# Patient Record
Sex: Female | Born: 1995 | Race: White | Hispanic: No | Marital: Married | Smoking: Never smoker
Health system: Southern US, Community
[De-identification: ages and names within clinical notes are randomized; demographics above are authoritative.]

## PROBLEM LIST (undated history)

## (undated) ENCOUNTER — Inpatient Hospital Stay (HOSPITAL_COMMUNITY): Payer: Self-pay

## (undated) DIAGNOSIS — F419 Anxiety disorder, unspecified: Secondary | ICD-10-CM

## (undated) DIAGNOSIS — O139 Gestational [pregnancy-induced] hypertension without significant proteinuria, unspecified trimester: Secondary | ICD-10-CM

## (undated) DIAGNOSIS — G43909 Migraine, unspecified, not intractable, without status migrainosus: Secondary | ICD-10-CM

## (undated) DIAGNOSIS — O321XX0 Maternal care for breech presentation, not applicable or unspecified: Secondary | ICD-10-CM

## (undated) DIAGNOSIS — F329 Major depressive disorder, single episode, unspecified: Secondary | ICD-10-CM

## (undated) DIAGNOSIS — N809 Endometriosis, unspecified: Secondary | ICD-10-CM

## (undated) DIAGNOSIS — F32A Depression, unspecified: Secondary | ICD-10-CM

## (undated) DIAGNOSIS — O24419 Gestational diabetes mellitus in pregnancy, unspecified control: Secondary | ICD-10-CM

## (undated) DIAGNOSIS — D219 Benign neoplasm of connective and other soft tissue, unspecified: Secondary | ICD-10-CM

## (undated) DIAGNOSIS — Z98891 History of uterine scar from previous surgery: Secondary | ICD-10-CM

## (undated) HISTORY — PX: BREAST REDUCTION SURGERY: SHX8

## (undated) HISTORY — PX: TUBAL LIGATION: SHX77

## (undated) HISTORY — PX: BREAST SURGERY: SHX581

## (undated) HISTORY — PX: NISSEN FUNDOPLICATION: SHX2091

---
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
## 2014-12-10 DIAGNOSIS — K219 Gastro-esophageal reflux disease without esophagitis: Secondary | ICD-10-CM | POA: Insufficient documentation

## 2014-12-10 HISTORY — DX: Gastro-esophageal reflux disease without esophagitis: K21.9

## 2014-12-10 HISTORY — PX: LAPAROSCOPY: SHX197

## 2015-05-15 DIAGNOSIS — G9340 Encephalopathy, unspecified: Secondary | ICD-10-CM | POA: Insufficient documentation

## 2015-09-20 HISTORY — PX: TONSILLECTOMY: SUR1361

## 2016-04-29 LAB — OB RESULTS CONSOLE RPR: RPR: NONREACTIVE

## 2016-04-29 LAB — OB RESULTS CONSOLE GC/CHLAMYDIA
Chlamydia: NEGATIVE
GC PROBE AMP, GENITAL: NEGATIVE

## 2016-04-29 LAB — OB RESULTS CONSOLE ANTIBODY SCREEN: ANTIBODY SCREEN: POSITIVE

## 2016-04-29 LAB — OB RESULTS CONSOLE HEPATITIS B SURFACE ANTIGEN: Hepatitis B Surface Ag: NEGATIVE

## 2016-04-29 LAB — OB RESULTS CONSOLE ABO/RH: RH Type: NEGATIVE

## 2016-04-29 LAB — OB RESULTS CONSOLE HIV ANTIBODY (ROUTINE TESTING): HIV: NONREACTIVE

## 2016-06-02 ENCOUNTER — Encounter (HOSPITAL_COMMUNITY): Payer: Self-pay | Admitting: *Deleted

## 2016-06-02 ENCOUNTER — Inpatient Hospital Stay (HOSPITAL_COMMUNITY)
Admission: AD | Admit: 2016-06-02 | Discharge: 2016-06-02 | Disposition: A | Discharge status: Home / Self Care | Payer: BLUE CROSS/BLUE SHIELD | Source: Ambulatory Visit | Attending: Obstetrics and Gynecology | Admitting: Obstetrics and Gynecology

## 2016-06-02 DIAGNOSIS — Z3A16 16 weeks gestation of pregnancy: Secondary | ICD-10-CM | POA: Insufficient documentation

## 2016-06-02 DIAGNOSIS — O9989 Other specified diseases and conditions complicating pregnancy, childbirth and the puerperium: Secondary | ICD-10-CM | POA: Diagnosis not present

## 2016-06-02 DIAGNOSIS — O26892 Other specified pregnancy related conditions, second trimester: Secondary | ICD-10-CM | POA: Diagnosis not present

## 2016-06-02 DIAGNOSIS — E162 Hypoglycemia, unspecified: Secondary | ICD-10-CM | POA: Diagnosis not present

## 2016-06-02 DIAGNOSIS — E161 Other hypoglycemia: Secondary | ICD-10-CM | POA: Diagnosis not present

## 2016-06-02 DIAGNOSIS — H538 Other visual disturbances: Secondary | ICD-10-CM | POA: Diagnosis present

## 2016-06-02 DIAGNOSIS — Z3492 Encounter for supervision of normal pregnancy, unspecified, second trimester: Secondary | ICD-10-CM

## 2016-06-02 HISTORY — DX: Migraine, unspecified, not intractable, without status migrainosus: G43.909

## 2016-06-02 LAB — CBC
HCT: 33.9 % — ABNORMAL LOW (ref 36.0–46.0)
Hemoglobin: 12 g/dL (ref 12.0–15.0)
MCH: 31.3 pg (ref 26.0–34.0)
MCHC: 35.4 g/dL (ref 30.0–36.0)
MCV: 88.5 fL (ref 78.0–100.0)
Platelets: 236 10*3/uL (ref 150–400)
RBC: 3.83 MIL/uL — AB (ref 3.87–5.11)
RDW: 12.4 % (ref 11.5–15.5)
WBC: 11.7 10*3/uL — ABNORMAL HIGH (ref 4.0–10.5)

## 2016-06-02 LAB — URINALYSIS, ROUTINE W REFLEX MICROSCOPIC
BILIRUBIN URINE: NEGATIVE
GLUCOSE, UA: NEGATIVE mg/dL
HGB URINE DIPSTICK: NEGATIVE
KETONES UR: NEGATIVE mg/dL
Nitrite: NEGATIVE
PH: 6 (ref 5.0–8.0)
Protein, ur: NEGATIVE mg/dL
SPECIFIC GRAVITY, URINE: 1.025 (ref 1.005–1.030)

## 2016-06-02 LAB — COMPREHENSIVE METABOLIC PANEL
ALK PHOS: 42 U/L (ref 38–126)
ALT: 12 U/L — AB (ref 14–54)
AST: 17 U/L (ref 15–41)
Albumin: 3.5 g/dL (ref 3.5–5.0)
Anion gap: 6 (ref 5–15)
BUN: 7 mg/dL (ref 6–20)
CALCIUM: 8.8 mg/dL — AB (ref 8.9–10.3)
CO2: 23 mmol/L (ref 22–32)
CREATININE: 0.47 mg/dL (ref 0.44–1.00)
Chloride: 106 mmol/L (ref 101–111)
GFR calc non Af Amer: >60 mL/min (ref >60)
Glucose, Bld: 77 mg/dL (ref 65–99)
Potassium: 3.8 mmol/L (ref 3.5–5.1)
SODIUM: 135 mmol/L (ref 135–145)
Total Bilirubin: 0.4 mg/dL (ref 0.3–1.2)
Total Protein: 6.7 g/dL (ref 6.5–8.1)

## 2016-06-02 LAB — URINE MICROSCOPIC-ADD ON: RBC / HPF: NONE SEEN RBC/hpf (ref 0–5)

## 2016-06-02 NOTE — MAU Provider Note (Signed)
History     CSN: [id number]  Arrival date and time: 06/02/16 1020   None    G3P0020 @16 .1 weeks c/o acute onset of blurry vision and lightheadedness. She reports these episodes starting about 2 weeks ago but when occurred today it was more severe and lasted longer. Nausea was also associated with the episode today. She calls the episodes "head zaps" and they usually last a few seconds. She denies LOC. No recent fevers. She denies SOB or chest pain. She reports good hydration and nutrition this am however she ate pancakes with syrup and water. She denies anxiety, worry, and increased stress. She has a history of migraines but denies similar sx.   OB History    Gravida Para Term Preterm AB Living   3       2     SAB TAB Ectopic Multiple Live Births   2              Past Medical History:  Diagnosis Date  . Migraines     Past Surgical History:  Procedure Laterality Date  . ADENOIDECTOMY  10/29/2007  . BREAST REDUCTION SURGERY    . LAPAROSCOPY  12/10/2014  . RHINOPLASTY  03/22/2010   deviated septum  . TONSILLECTOMY  2017    History reviewed. No pertinent family history.  Social History  Substance Use Topics  . Smoking status: Never Smoker  . Smokeless tobacco: Never Used  . Alcohol use Not on file    Allergies: No Known Allergies  Prescriptions Prior to Admission  Medication Sig Dispense Refill Last Dose  . acetaminophen (TYLENOL) 325 MG tablet Take 650 mg by mouth every 6 (six) hours as needed for moderate pain.   Past Week at Unknown time  . Doxylamine-Pyridoxine (DICLEGIS) 10-10 MG TBEC Take 1 tablet by mouth as needed.    Past Week at Unknown time  . Prenatal Vit-Fe Fumarate-FA (MULTIVITAMIN-PRENATAL) 27-0.8 MG TABS tablet Take 1 tablet by mouth daily at 12 noon.   06/01/2016 at Unknown time  . sertraline (ZOLOFT) 25 MG tablet Take 25 mg by mouth daily.   06/01/2016 at Unknown time    Review of Systems  Constitutional: Negative.   Eyes: Positive for blurred  vision. Negative for double vision and photophobia.  Cardiovascular: Negative.   Gastrointestinal: Positive for nausea.  Neurological: Positive for dizziness. Negative for headaches.   Physical Exam   Blood pressure 102/74, pulse 84, temperature 97.8 F (36.6 C), temperature source Oral, resp. rate 16, last menstrual period 02/10/2016, SpO2 100 %.  Physical Exam  Constitutional: She is oriented to person, place, and time. She appears well-developed and well-nourished.  HENT:  Head: Normocephalic and atraumatic.  Neck: Normal range of motion.  Cardiovascular: Normal rate and regular rhythm.   Respiratory: Effort normal and breath sounds normal.  Musculoskeletal: Normal range of motion.  Neurological: She is alert and oriented to person, place, and time. No cranial nerve deficit.  Skin: Skin is warm and dry.  Psychiatric: She has a normal mood and affect.  FHT: 152 bpm  Results for orders placed or performed during the hospital encounter of 06/02/16 (from the past 24 hour(s))  Urinalysis, Routine w reflex microscopic (not at [facility])     Status: Abnormal   Collection Time: 06/02/16 10:30 AM  Result Value Ref Range   Color, Urine YELLOW YELLOW   APPearance HAZY (A) CLEAR   Specific Gravity, Urine 1.025 1.005 - 1.030   pH 6.0 5.0 - 8.0   Glucose, UA  NEGATIVE NEGATIVE mg/dL   Hgb urine dipstick NEGATIVE NEGATIVE   Bilirubin Urine NEGATIVE NEGATIVE   Ketones, ur NEGATIVE NEGATIVE mg/dL   Protein, ur NEGATIVE NEGATIVE mg/dL   Nitrite NEGATIVE NEGATIVE   Leukocytes, UA TRACE (A) NEGATIVE  Urine microscopic-add on     Status: Abnormal   Collection Time: 06/02/16 10:30 AM  Result Value Ref Range   Squamous Epithelial / LPF 0-5 (A) NONE SEEN   WBC, UA 0-5 0 - 5 WBC/hpf   RBC / HPF NONE SEEN 0 - 5 RBC/hpf   Bacteria, UA RARE (A) NONE SEEN   Urine-Other MUCOUS PRESENT   CBC     Status: Abnormal   Collection Time: 06/02/16 11:39 AM  Result Value Ref Range   WBC 11.7 (H) 4.0 - 10.5  K/uL   RBC 3.83 (L) 3.87 - 5.11 MIL/uL   Hemoglobin 12.0 12.0 - 15.0 g/dL   HCT 33.9 (L) 36.0 - 46.0 %   MCV 88.5 78.0 - 100.0 fL   MCH 31.3 26.0 - 34.0 pg   MCHC 35.4 30.0 - 36.0 g/dL   RDW 12.4 11.5 - 15.5 %   Platelets 236 150 - 400 K/uL  Comprehensive metabolic panel     Status: Abnormal   Collection Time: 06/02/16 11:39 AM  Result Value Ref Range   Sodium 135 135 - 145 mmol/L   Potassium 3.8 3.5 - 5.1 mmol/L   Chloride 106 101 - 111 mmol/L   CO2 23 22 - 32 mmol/L   Glucose, Bld 77 65 - 99 mg/dL   BUN 7 6 - 20 mg/dL   Creatinine, Ser 0.47 0.44 - 1.00 mg/dL   Calcium 8.8 (L) 8.9 - 10.3 mg/dL   Total Protein 6.7 6.5 - 8.1 g/dL   Albumin 3.5 3.5 - 5.0 g/dL   AST 17 15 - 41 U/L   ALT 12 (L) 14 - 54 U/L   Alkaline Phosphatase 42 38 - 126 U/L   Total Bilirubin 0.4 0.3 - 1.2 mg/dL   GFR calc non Af Amer >60 >60 mL/min   GFR calc Af Amer >60 >60 mL/min   Anion gap 6 5 - 15    MAU Course  Procedures  MDM Labs ordered and reviewed. No evidence of acute illness. Etiology likely transient hypoglycemia r/t nutrition, exacerbated by pregnancy. Discussed presentation and exam findings with Dr. Marvel Plan. Stable for discharge home.  Assessment and Plan   1. Second trimester pregnancy   2.      Transient hypoglycemia  Discharge home Consume frequent meals and snacks, always pair protein with carb Maintain good hydration with water Follow up as scheduled in OB office Call for sooner appt for persistent or worsening sx  Julianne Handler, CNM 06/02/2016, 11:02 AM

## 2016-06-02 NOTE — MAU Note (Signed)
Pt was in class and said around 0930 her vision got really blurry and she couldn't move and she felt really nauseated.  She still says her vision is still blurry and she is still nauseous.  Denies LOF/VB.

## 2016-06-02 NOTE — Discharge Instructions (Signed)
Second Trimester of Pregnancy The second trimester is from week 13 through week 28, months 4 through 6. The second trimester is often a time when you feel your best. Your body has also adjusted to being pregnant, and you begin to feel better physically. Usually, morning sickness has lessened or quit completely, you may have more energy, and you may have an increase in appetite. The second trimester is also a time when the fetus is growing rapidly. At the end of the sixth month, the fetus is about 9 inches long and weighs about 1 pounds. You will likely begin to feel the baby move (quickening) between 18 and 20 weeks of the pregnancy. BODY CHANGES Your body goes through many changes during pregnancy. The changes vary from woman to woman.   Your weight will continue to increase. You will notice your lower abdomen bulging out.  You may begin to get stretch marks on your hips, abdomen, and breasts.  You may develop headaches that can be relieved by medicines approved by your health care provider.  You may urinate more often because the fetus is pressing on your bladder.  You may develop or continue to have heartburn as a result of your pregnancy.  You may develop constipation because certain hormones are causing the muscles that push waste through your intestines to slow down.  You may develop hemorrhoids or swollen, bulging veins (varicose veins).  You may have back pain because of the weight gain and pregnancy hormones relaxing your joints between the bones in your pelvis and as a result of a shift in weight and the muscles that support your balance.  Your breasts will continue to grow and be tender.  Your gums may bleed and may be sensitive to brushing and flossing.  Dark spots or blotches (chloasma, mask of pregnancy) may develop on your face. This will likely fade after the baby is born.  A dark line from your belly button to the pubic area (linea nigra) may appear. This will likely fade  after the baby is born.  You may have changes in your hair. These can include thickening of your hair, rapid growth, and changes in texture. Some women also have hair loss during or after pregnancy, or hair that feels dry or thin. Your hair will most likely return to normal after your baby is born. WHAT TO EXPECT AT YOUR PRENATAL VISITS During a routine prenatal visit:  You will be weighed to make sure you and the fetus are growing normally.  Your blood pressure will be taken.  Your abdomen will be measured to track your baby's growth.  The fetal heartbeat will be listened to.  Any test results from the previous visit will be discussed. Your health care provider may ask you:  How you are feeling.  If you are feeling the baby move.  If you have had any abnormal symptoms, such as leaking fluid, bleeding, severe headaches, or abdominal cramping.  If you are using any tobacco products, including cigarettes, chewing tobacco, and electronic cigarettes.  If you have any questions. Other tests that may be performed during your second trimester include:  Blood tests that check for:  Low iron levels (anemia).  Gestational diabetes (between 24 and 28 weeks).  Rh antibodies.  Urine tests to check for infections, diabetes, or protein in the urine.  An ultrasound to confirm the proper growth and development of the baby.  An amniocentesis to check for possible genetic problems.  Fetal screens for spina bifida  and Down syndrome.  HIV (human immunodeficiency virus) testing. Routine prenatal testing includes screening for HIV, unless you choose not to have this test. HOME CARE INSTRUCTIONS   Avoid all smoking, herbs, alcohol, and unprescribed drugs. These chemicals affect the formation and growth of the baby.  Do not use any tobacco products, including cigarettes, chewing tobacco, and electronic cigarettes. If you need help quitting, ask your health care provider. You may receive  counseling support and other resources to help you quit.  Follow your health care provider's instructions regarding medicine use. There are medicines that are either safe or unsafe to take during pregnancy.  Exercise only as directed by your health care provider. Experiencing uterine cramps is a good sign to stop exercising.  Continue to eat regular, healthy meals.  Wear a good support bra for breast tenderness.  Do not use hot tubs, steam rooms, or saunas.  Wear your seat belt at all times when driving.  Avoid raw meat, uncooked cheese, cat litter boxes, and soil used by cats. These carry germs that can cause birth defects in the baby.  Take your prenatal vitamins.  Take 1500-2000 mg of calcium daily starting at the 20th week of pregnancy until you deliver your baby.  Try taking a stool softener (if your health care provider approves) if you develop constipation. Eat more high-fiber foods, such as fresh vegetables or fruit and whole grains. Drink plenty of fluids to keep your urine clear or pale yellow.  Take warm sitz baths to soothe any pain or discomfort caused by hemorrhoids. Use hemorrhoid cream if your health care provider approves.  If you develop varicose veins, wear support hose. Elevate your feet for 15 minutes, 3-4 times a day. Limit salt in your diet.  Avoid heavy lifting, wear low heel shoes, and practice good posture.  Rest with your legs elevated if you have leg cramps or low back pain.  Visit your dentist if you have not gone yet during your pregnancy. Use a soft toothbrush to brush your teeth and be gentle when you floss.  A sexual relationship may be continued unless your health care provider directs you otherwise.  Continue to go to all your prenatal visits as directed by your health care provider. SEEK MEDICAL CARE IF:   You have dizziness.  You have mild pelvic cramps, pelvic pressure, or nagging pain in the abdominal area.  You have persistent nausea,  vomiting, or diarrhea.  You have a bad smelling vaginal discharge.  You have pain with urination. SEEK IMMEDIATE MEDICAL CARE IF:   You have a fever.  You are leaking fluid from your vagina.  You have spotting or bleeding from your vagina.  You have severe abdominal cramping or pain.  You have rapid weight gain or loss.  You have shortness of breath with chest pain.  You notice sudden or extreme swelling of your face, hands, ankles, feet, or legs.  You have not felt your baby move in over an hour.  You have severe headaches that do not go away with medicine.  You have vision changes.   This information is not intended to replace advice given to you by your health care provider. Make sure you discuss any questions you have with your health care provider.   Document Released: 08/30/2001 Document Revised: 09/26/2014 Document Reviewed: 11/06/2012 Elsevier Interactive Patient Education 2016 [place] for Pregnant Women While you are pregnant, your body will require additional nutrition to help support your growing baby. It  is recommended that you consume:  150 additional calories each day during your first trimester.  300 additional calories each day during your second trimester.  300 additional calories each day during your third trimester. Eating a healthy, well-balanced diet is very important for your health and for your baby's health. You also have a higher need for some vitamins and minerals, such as folic acid, calcium, iron, and vitamin D. WHAT DO I NEED TO KNOW ABOUT EATING DURING PREGNANCY?  Do not try to lose weight or go on a diet during pregnancy.  Choose healthy, nutritious foods. Choose  of a sandwich with a glass of milk instead of a candy bar or a high-calorie sugar-sweetened beverage.  Limit your overall intake of foods that have "empty calories." These are foods that have little nutritional value, such as sweets, desserts, candies,  sugar-sweetened beverages, and fried foods.  Eat a variety of foods, especially fruits and vegetables.  Take a prenatal vitamin to help meet the additional needs during pregnancy, specifically for folic acid, iron, calcium, and vitamin D.  Remember to stay active. Ask your health care provider for exercise recommendations that are specific to you.  Practice good food safety and cleanliness, such as washing your hands before you eat and after you prepare raw meat. This helps to prevent foodborne illnesses, such as listeriosis, that can be very dangerous for your baby. Ask your health care provider for more information about listeriosis. WHAT DOES 150 EXTRA CALORIES LOOK LIKE? Healthy options for an additional 150 calories each day could be any of the following:  Plain low-fat yogurt (6-8 oz) with  cup of berries.  1 apple with 2 teaspoons of peanut butter.  Cut-up vegetables with  cup of hummus.  Low-fat chocolate milk (8 oz or 1 cup).  1 string cheese with 1 medium orange.   of a peanut butter and jelly sandwich on whole-wheat bread (1 tsp of peanut butter). For 300 calories, you could eat two of those healthy options each day.  WHAT IS A HEALTHY AMOUNT OF WEIGHT TO GAIN? The recommended amount of weight for you to gain is based on your pre-pregnancy BMI. If your pre-pregnancy BMI was:  Less than 18 (underweight), you should gain 28-40 lb.  18-24.9 (normal), you should gain 25-35 lb.  25-29.9 (overweight), you should gain 15-25 lb.  Greater than 30 (obese), you should gain 11-20 lb. WHAT IF I AM HAVING TWINS OR MULTIPLES? Generally, pregnant women who will be having twins or multiples may need to increase their daily calories by 300-600 calories each day. The recommended range for total weight gain is 25-54 lb, depending on your pre-pregnancy BMI. Talk with your health care provider for specific guidance about additional nutritional needs, weight gain, and exercise during your  pregnancy. WHAT FOODS CAN I EAT? Grains Any grains. Try to choose whole grains, such as whole-wheat bread, oatmeal, or brown rice. Vegetables Any vegetables. Try to eat a variety of colors and types of vegetables to get a full range of vitamins and minerals. Remember to wash your vegetables well before eating. Fruits Any fruits. Try to eat a variety of colors and types of fruit to get a full range of vitamins and minerals. Remember to wash your fruits well before eating. Meats and Other Protein Sources Lean meats, including chicken, [place], fish, and lean cuts of beef, veal, or pork. Make sure that all meats are cooked to "well done." Tofu. Tempeh. Beans. Eggs. Peanut butter and other nut butters. Seafood,  such as shrimp, crab, and lobster. If you choose fish, select types that are higher in omega-3 fatty acids, including salmon, herring, mussels, trout, sardines, and pollock. Make sure that all meats are cooked to food-safe temperatures. Dairy Pasteurized milk and milk alternatives. Pasteurized yogurt and pasteurized cheese. Cottage cheese. Sour cream. Beverages Water. Juices that contain 100% fruit juice or vegetable juice. Caffeine-free teas and decaffeinated coffee. Drinks that contain caffeine are okay to drink, but it is better to avoid caffeine. Keep your total caffeine intake to less than 200 mg each day (12 oz of coffee, tea, or soda) or as directed by your health care provider. Condiments Any pasteurized condiments. Sweets and Desserts Any sweets and desserts. Fats and Oils Any fats and oils. The items listed above may not be a complete list of recommended foods or beverages. Contact your dietitian for more options. WHAT FOODS ARE NOT RECOMMENDED? Vegetables Unpasteurized (raw) vegetable juices. Fruits Unpasteurized (raw) fruit juices. Meats and Other Protein Sources Cured meats that have nitrates, such as bacon, salami, and hotdogs. Luncheon meats, bologna, or other deli meats  (unless they are reheated until they are steaming hot). Refrigerated pate, meat spreads from a meat counter, smoked seafood that is found in the refrigerated section of a store. Raw fish, such as sushi or sashimi. High mercury content fish, such as tilefish, shark, swordfish, and king mackerel. Raw meats, such as tuna or beef tartare. Undercooked meats and poultry. Make sure that all meats are cooked to food-safe temperatures. Dairy Unpasteurized (raw) milk and any foods that have raw milk in them. Soft cheeses, such as feta, queso blanco, queso fresco, Brie, Camembert cheeses, blue-veined cheeses, and Panela cheese (unless it is made with pasteurized milk, which must be stated on the label). Beverages Alcohol. Sugar-sweetened beverages, such as sodas, teas, or energy drinks. Condiments Homemade fermented foods and drinks, such as pickles, sauerkraut, or kombucha drinks. (Store-bought pasteurized versions of these are okay.) Other Salads that are made in the store, such as ham salad, chicken salad, egg salad, tuna salad, and seafood salad. The items listed above may not be a complete list of foods and beverages to avoid. Contact your dietitian for more information.   This information is not intended to replace advice given to you by your health care provider. Make sure you discuss any questions you have with your health care provider.   Document Released: 06/20/2014 Document Reviewed: 06/20/2014 Elsevier Interactive Patient Education [employer].

## 2016-06-28 LAB — OB RESULTS CONSOLE GBS: STREP GROUP B AG: POSITIVE

## 2016-07-30 ENCOUNTER — Inpatient Hospital Stay (HOSPITAL_COMMUNITY)
Admission: AD | Admit: 2016-07-30 | Discharge: 2016-07-30 | Disposition: A | Discharge status: Home / Self Care | Payer: BLUE CROSS/BLUE SHIELD | Source: Ambulatory Visit | Attending: Obstetrics and Gynecology | Admitting: Obstetrics and Gynecology

## 2016-07-30 ENCOUNTER — Encounter (HOSPITAL_COMMUNITY): Payer: Self-pay | Admitting: Certified Nurse Midwife

## 2016-07-30 DIAGNOSIS — N393 Stress incontinence (female) (male): Secondary | ICD-10-CM

## 2016-07-30 DIAGNOSIS — N898 Other specified noninflammatory disorders of vagina: Secondary | ICD-10-CM | POA: Insufficient documentation

## 2016-07-30 DIAGNOSIS — O26892 Other specified pregnancy related conditions, second trimester: Secondary | ICD-10-CM | POA: Diagnosis not present

## 2016-07-30 DIAGNOSIS — G43909 Migraine, unspecified, not intractable, without status migrainosus: Secondary | ICD-10-CM | POA: Insufficient documentation

## 2016-07-30 DIAGNOSIS — M545 Low back pain: Secondary | ICD-10-CM | POA: Diagnosis present

## 2016-07-30 DIAGNOSIS — O99352 Diseases of the nervous system complicating pregnancy, second trimester: Secondary | ICD-10-CM | POA: Insufficient documentation

## 2016-07-30 LAB — URINALYSIS, ROUTINE W REFLEX MICROSCOPIC
BILIRUBIN URINE: NEGATIVE
GLUCOSE, UA: NEGATIVE mg/dL
HGB URINE DIPSTICK: NEGATIVE
Ketones, ur: NEGATIVE mg/dL
Leukocytes, UA: NEGATIVE
Nitrite: NEGATIVE
Protein, ur: NEGATIVE mg/dL
SPECIFIC GRAVITY, URINE: 1.01 (ref 1.005–1.030)
pH: 7 (ref 5.0–8.0)

## 2016-07-30 LAB — AMNISURE RUPTURE OF MEMBRANE (ROM) NOT AT ARMC: Amnisure ROM: NEGATIVE

## 2016-07-30 NOTE — MAU Note (Signed)
Pt states she had a large gush of fluid this AM and she hasn't felt her baby move since last night. Pt states she has a lower backache but denies ctxs. Pt denies vaginal bleeding.

## 2016-07-30 NOTE — MAU Provider Note (Signed)
History   G3P0020 @ 24.3 wks in c/o sm gush of fluid one hour ago. No further leaking. Denies vag bleeding. States has not felt baby move today. Also has low back pain that is dull and achy. Pt is married and denies any STD risk  CSN: [id number]  Arrival date & time 07/30/16  1240   None     Chief Complaint  Patient presents with  . Rupture of Membranes    HPI  Past Medical History:  Diagnosis Date  . Migraines     Past Surgical History:  Procedure Laterality Date  . ADENOIDECTOMY  10/29/2007  . BREAST REDUCTION SURGERY    . LAPAROSCOPY  12/10/2014  . RHINOPLASTY  03/22/2010   deviated septum  . TONSILLECTOMY  2017    History reviewed. No pertinent family history.  Social History  Substance Use Topics  . Smoking status: Never Smoker  . Smokeless tobacco: Never Used  . Alcohol use No    OB History    Gravida Para Term Preterm AB Living   3       2     SAB TAB Ectopic Multiple Live Births   2              Review of Systems  Constitutional: Negative.   HENT: Negative.   Eyes: Negative.   Respiratory: Negative.   Cardiovascular: Negative.   Gastrointestinal: Negative.   Endocrine: Negative.   Genitourinary: Negative.   Musculoskeletal: Positive for back pain.  Skin: Negative.   Allergic/Immunologic: Negative.   Neurological: Negative.   Hematological: Negative.   Psychiatric/Behavioral: Negative.     Allergies  Patient has no known allergies.  Home Medications    BP 110/70 (BP Location: Right Arm)   Pulse 78   Temp 98 F (36.7 C) (Oral)   Resp 18   LMP 02/10/2016   Physical Exam  Constitutional: She is oriented to person, place, and time. She appears well-developed and well-nourished.  HENT:  Head: Normocephalic.  Eyes: Pupils are equal, round, and reactive to light.  Neck: Normal range of motion.  Cardiovascular: Normal rate, regular rhythm, normal heart sounds and intact distal pulses.   Pulmonary/Chest: Effort normal and breath  sounds normal.  Abdominal: Soft. Bowel sounds are normal.  Genitourinary: Vagina normal and uterus normal.  Musculoskeletal: Normal range of motion.  Neurological: She is alert and oriented to person, place, and time. She has normal reflexes.  Skin: Skin is warm and dry.  Psychiatric: She has a normal mood and affect. Her behavior is normal. Judgment and thought content normal.    MAU Course  Procedures (including critical care time)  Labs Reviewed  AMNISURE RUPTURE OF MEMBRANE (ROM) NOT AT [facility]   No results found.   No diagnosis found.  Urinary incontinence   MDM  Sterile spec exam no active leaking noted, no abnormal discharge, cervix visually closed and thick. amni sure done . good fetal movement noted. Amni sure neg. Consulted Dr. Willis Modena pt tpo be d/c home

## 2016-07-30 NOTE — Discharge Instructions (Signed)
Urinary Incontinence Urinary incontinence is the involuntary loss of urine from your bladder. CAUSES  There are many causes of urinary incontinence. They include:  Medicines.  Infections.  Prostatic enlargement, leading to overflow of urine from your bladder.  Surgery.  Neurological diseases.  Emotional factors. SIGNS AND SYMPTOMS Urinary Incontinence can be divided into four types: 1. Urge incontinence. Urge incontinence is the involuntary loss of urine before you have the opportunity to go to the bathroom. There is a sudden urge to void but not enough time to reach a bathroom. 2. Stress incontinence. Stress incontinence is the sudden loss of urine with any activity that forces urine to pass. It is commonly caused by anatomical changes to the pelvis and sphincter areas of your body. 3. Overflow incontinence. Overflow incontinence is the loss of urine from an obstructed opening to your bladder. This results in a backup of urine and a resultant buildup of pressure within the bladder. When the pressure within the bladder exceeds the closing pressure of the sphincter, the urine overflows, which causes incontinence, similar to water overflowing a dam. 4. Total incontinence. Total incontinence is the loss of urine as a result of the inability to store urine within your bladder. DIAGNOSIS  Evaluating the cause of incontinence may require:  A thorough and complete medical and obstetric history.  A complete physical exam.  Laboratory tests such as a urine culture and sensitivities. When additional tests are indicated, they can include:  An ultrasound exam.  Kidney and bladder X-rays.  Cystoscopy. This is an exam of the bladder using a narrow scope.  Urodynamic testing to test the nerve function to the bladder and sphincter areas. TREATMENT  Treatment for urinary incontinence depends on the cause:  For urge incontinence caused by a bacterial infection, antibiotics will be prescribed.  If the urge incontinence is related to medicines you take, your health care provider may have you change the medicine.  For stress incontinence, surgery to re-establish anatomical support to the bladder or sphincter, or both, will often correct the condition.  For overflow incontinence caused by an enlarged prostate, an operation to open the channel through the enlarged prostate will allow the flow of urine out of the bladder. In women with fibroids, a hysterectomy may be recommended.  For total incontinence, surgery on your urinary sphincter may help. An artificial urinary sphincter (an inflatable cuff placed around the urethra) may be required. In women who have developed a hole-like passage between their bladder and vagina (vesicovaginal fistula), surgery to close the fistula often is required. HOME CARE INSTRUCTIONS  Normal daily hygiene and the use of pads or adult diapers that are changed regularly will help prevent odors and skin damage.  Avoid caffeine. It can overstimulate your bladder.  Use the bathroom regularly. Try about every 2-3 hours to go to the bathroom, even if you do not feel the need to do so. Take time to empty your bladder completely. After urinating, wait a minute. Then try to urinate again.  For causes involving nerve dysfunction, keep a log of the medicines you take and a journal of the times you go to the bathroom. SEEK MEDICAL CARE IF:  You experience worsening of pain instead of improvement in pain after your procedure.  Your incontinence becomes worse instead of better. SEE IMMEDIATE MEDICAL CARE IF:  You experience fever or shaking chills.  You are unable to pass your urine.  You have redness spreading into your groin or down into your thighs. MAKE SURE   YOU:   Understand these instructions.   Will watch your condition.  Will get help right away if you are not doing well or get worse.   This information is not intended to replace advice given to you  by your health care provider. Make sure you discuss any questions you have with your health care provider.   Document Released: 10/13/2004 Document Revised: 09/26/2014 Document Reviewed: 02/12/2013 Elsevier Interactive Patient Education 2016 [employer] Inc[employer]  

## 2016-07-31 DIAGNOSIS — K644 Residual hemorrhoidal skin tags: Secondary | ICD-10-CM | POA: Insufficient documentation

## 2016-09-04 ENCOUNTER — Inpatient Hospital Stay (HOSPITAL_COMMUNITY)
Admission: AD | Admit: 2016-09-04 | Discharge: 2016-09-04 | Disposition: A | Discharge status: Home / Self Care | Payer: BLUE CROSS/BLUE SHIELD | Source: Ambulatory Visit | Attending: Obstetrics and Gynecology | Admitting: Obstetrics and Gynecology

## 2016-09-04 ENCOUNTER — Encounter (HOSPITAL_COMMUNITY): Payer: Self-pay | Admitting: *Deleted

## 2016-09-04 DIAGNOSIS — O26893 Other specified pregnancy related conditions, third trimester: Secondary | ICD-10-CM | POA: Diagnosis not present

## 2016-09-04 DIAGNOSIS — Z3A29 29 weeks gestation of pregnancy: Secondary | ICD-10-CM | POA: Diagnosis not present

## 2016-09-04 DIAGNOSIS — M545 Low back pain: Secondary | ICD-10-CM | POA: Diagnosis present

## 2016-09-04 DIAGNOSIS — M549 Dorsalgia, unspecified: Secondary | ICD-10-CM

## 2016-09-04 DIAGNOSIS — O99891 Other specified diseases and conditions complicating pregnancy: Secondary | ICD-10-CM

## 2016-09-04 DIAGNOSIS — O9989 Other specified diseases and conditions complicating pregnancy, childbirth and the puerperium: Secondary | ICD-10-CM

## 2016-09-04 LAB — WET PREP, GENITAL
CLUE CELLS WET PREP: NONE SEEN
SPERM: NONE SEEN
TRICH WET PREP: NONE SEEN
Yeast Wet Prep HPF POC: NONE SEEN

## 2016-09-04 LAB — URINALYSIS, ROUTINE W REFLEX MICROSCOPIC
Bilirubin Urine: NEGATIVE
Glucose, UA: NEGATIVE mg/dL
Hgb urine dipstick: NEGATIVE
Ketones, ur: NEGATIVE mg/dL
Nitrite: NEGATIVE
PH: 7 (ref 5.0–8.0)
Protein, ur: NEGATIVE mg/dL
SPECIFIC GRAVITY, URINE: 1.006 (ref 1.005–1.030)

## 2016-09-04 MED ORDER — CYCLOBENZAPRINE HCL 10 MG PO TABS
10.0000 mg | ORAL_TABLET | Freq: Once | ORAL | Status: AC
  Administered 2016-09-04: 10 mg via ORAL
  Filled 2016-09-04: qty 1

## 2016-09-04 MED ORDER — CYCLOBENZAPRINE HCL 10 MG PO TABS: 10.0000 mg | ORAL_TABLET | Freq: Three times a day (TID) | ORAL | 0 refills | Status: DC | PRN

## 2016-09-04 NOTE — MAU Provider Note (Signed)
Chief Complaint:  Back Pain   First Provider Initiated Contact with Patient 09/04/16 1612     HPI: Morgan Chang is a 20 y.o. G3P0020 at [redacted]w[redacted]d who presents to maternity admissions reporting low back pain throughout pregnancy that has worsened over the past two days.   Location: Sacrum Quality: Dull ache with intermittent sharp exacerbation Severity: 8/10 in pain scale Duration: Dull pain has been going on for months. Sharp exacerbations of pain has been going on for 2 days. Context: [redacted] weeks gestation. Timing: Constant with intermittent exacerbations Radiation: Pain does not radiate. Modifying factors: Mild improvement with warm compress. No improvement with Tylenol or rest. Associated signs and symptoms: Negative for fever, chills, nausea, vomiting, diarrhea, constipation, urinary complaints, vaginal bleeding or leaking of fluid. Positive for sporadic contractions, maximum 3 per hour and a few episodes of mucoid vaginal discharge. Good fetal movement.   Past Medical History:  Diagnosis Date  . Migraines    OB History  Gravida Para Term Preterm AB Living  3       2    SAB TAB Ectopic Multiple Live Births  2            # Outcome Date GA Lbr Len/2nd Weight Sex Delivery Anes PTL Lv  3 Current           2 SAB 2016          1 SAB 2014             Past Surgical History:  Procedure Laterality Date  . ADENOIDECTOMY  10/29/2007  . BREAST REDUCTION SURGERY    . LAPAROSCOPY  12/10/2014  . RHINOPLASTY  03/22/2010   deviated septum  . TONSILLECTOMY  2017   History reviewed. No pertinent family history. Social History  Substance Use Topics  . Smoking status: Never Smoker  . Smokeless tobacco: Never Used  . Alcohol use No   No Known Allergies Prescriptions Prior to Admission  Medication Sig Dispense Refill Last Dose  . acetaminophen (TYLENOL) 325 MG tablet Take 650 mg by mouth every 6 (six) hours as needed for mild pain, moderate pain or headache.    09/04/2016 at 1200  .  pantoprazole (PROTONIX) 20 MG tablet Take 20 mg by mouth at bedtime.   09/03/2016 at Unknown time  . Prenatal MV-Min-FA-Omega-3 (PRENATAL GUMMIES/DHA & FA) 0.4-32.5 MG CHEW Chew 2 each by mouth at bedtime.   09/03/2016 at Unknown time  . sertraline (ZOLOFT) 50 MG tablet Take 50 mg by mouth at bedtime.   09/03/2016 at Unknown time    I have reviewed patient's Past Medical Hx, Surgical Hx, Family Hx, Social Hx, medications and allergies.   ROS:  Review of Systems  Constitutional: Negative for appetite change, chills and fever.  Gastrointestinal: Negative for abdominal pain, constipation, diarrhea, nausea and vomiting.  Genitourinary: Positive for vaginal discharge (mucus). Negative for difficulty urinating, dysuria, flank pain, frequency, hematuria, pelvic pain, vaginal bleeding and vaginal pain.  Musculoskeletal: Positive for back pain. Negative for gait problem, myalgias, neck pain and neck stiffness.    Physical Exam   Patient Vitals for the past 24 hrs:  BP Temp Temp src Pulse Resp  09/04/16 1525 114/77 97.7 F (36.5 C) Oral 94 16   Constitutional: Well-developed, well-nourished female in no acute distress.  Cardiovascular: normal rate Respiratory: normal effort GI: Abd soft, non-tender, gravid appropriate for gestational age. Pos BS x 4 MS: Low back nontender. Normal ROM and gait. Neurologic: Alert and oriented x 4.  GU:  Neg CVAT.  Pelvic: NEFG, physiologic discharge, no blood initially, but surface of cervix bled slightly upon repositioning of speculum, cervix friable. No mucopurulent discharge or pooling of fluid. No CMT. Unable to collect fetal fibronectin due to bleeding.  Dilation: Closed Effacement (%): Thick Exam by:: Manya Silvas, CNM  FHT:  Baseline 145 , moderate variability, accelerations present, no decelerations Contractions: Rare uterine irritability.   Labs: Results for orders placed or performed during the hospital encounter of 09/04/16 (from the past 24  hour(s))  Urinalysis, Routine w reflex microscopic     Status: Abnormal   Collection Time: 09/04/16  3:15 PM  Result Value Ref Range   Color, Urine STRAW (A) YELLOW   APPearance CLEAR CLEAR   Specific Gravity, Urine 1.006 1.005 - 1.030   pH 7.0 5.0 - 8.0   Glucose, UA NEGATIVE NEGATIVE mg/dL   Hgb urine dipstick NEGATIVE NEGATIVE   Bilirubin Urine NEGATIVE NEGATIVE   Ketones, ur NEGATIVE NEGATIVE mg/dL   Protein, ur NEGATIVE NEGATIVE mg/dL   Nitrite NEGATIVE NEGATIVE   Leukocytes, UA TRACE (A) NEGATIVE   RBC / HPF 0-5 0 - 5 RBC/hpf   WBC, UA 0-5 0 - 5 WBC/hpf   Bacteria, UA RARE (A) NONE SEEN   Squamous Epithelial / LPF 0-5 (A) NONE SEEN  Wet prep, genital     Status: Abnormal   Collection Time: 09/04/16  4:10 PM  Result Value Ref Range   Yeast Wet Prep HPF POC NONE SEEN NONE SEEN   Trich, Wet Prep NONE SEEN NONE SEEN   Clue Cells Wet Prep HPF POC NONE SEEN NONE SEEN   WBC, Wet Prep HPF POC MODERATE (A) NONE SEEN   Sperm NONE SEEN     Imaging:  No results found.  MAU Course: Orders Placed This Encounter  Procedures  . Wet prep, genital  . Urinalysis, Routine w reflex microscopic  . Fetal fibronectin  . Discharge patient   Meds ordered this encounter  Medications  . cyclobenzaprine (FLEXERIL) tablet 10 mg   Pain resolved after Flexeril.  Discussed history, exam, labs and response to Flexeril with Dr. Melba Coon. Agrees with plan of care.  MDM: - 20 year old female at 78 weeks 4 days gestation with low back pain consistent with musculoskeletal pain. No evidence of active preterm labor or any emergent condition. - Friable cervix bled only with exam.  Assessment: 1. Back pain affecting pregnancy in third trimester     Plan: Discharge home in stable condition.  Preterm Labor precautions and fetal kick counts. Comfort measures. May use Flexeril sparingly for back pain. Follow-up Information    [facility] Follow up.   Why:  as scheduled or sooner  as needed if symptoms worsen. Contact information: [address]  [address] [place] [address] [phone]        [facility] Follow up.   Why:  as needed in emergencies Contact information: [id number] [place] [phone]          Allergies as of 09/04/2016   No Known Allergies     Medication List    TAKE these medications   acetaminophen 325 MG tablet Commonly known as:  TYLENOL Take 650 mg by mouth every 6 (six) hours as needed for mild pain, moderate pain or headache.   cyclobenzaprine 10 MG tablet Commonly known as:  FLEXERIL Take 1 tablet (10 mg total) by mouth 3 (three) times daily as needed  for muscle spasms. Use sparingly   pantoprazole 20 MG tablet Commonly known as:  PROTONIX Take 20 mg by mouth at bedtime.   PRENATAL GUMMIES/DHA & FA 0.4-32.5 MG Chew Chew 2 each by mouth at bedtime.   sertraline 50 MG tablet Commonly known as:  ZOLOFT Take 50 mg by mouth at bedtime.       [place], [place] 09/04/2016 5:53 PM

## 2016-09-04 NOTE — Discharge Instructions (Signed)
Back Pain in Pregnancy Introduction Back pain during pregnancy is common. Back pain may be caused by several factors that are related to changes during your pregnancy. Follow these instructions at home: Managing pain, stiffness, and swelling  If directed, apply ice for sudden (acute) back pain.  Put ice in a plastic bag.  Place a towel between your skin and the bag.  Leave the ice on for 20 minutes, 2-3 times per day.  If directed, apply heat to the affected area before you exercise:  Place a towel between your skin and the heat pack or heating pad.  Leave the heat on for 20-30 minutes.  Remove the heat if your skin turns bright red. This is especially important if you are unable to feel pain, heat, or cold. You may have a greater risk of getting burned. Activity  Exercise as told by your health care provider. Exercising is the best way to prevent or manage back pain.  Listen to your body when lifting. If lifting hurts, ask for help or bend your knees. This uses your leg muscles instead of your back muscles.  Squat down when picking up something from the floor. Do not bend over.  Only use bed rest as told by your health care provider. Bed rest should only be used for the most severe episodes of back pain. Standing, Sitting, and Lying Down  Do not stand in one place for long periods of time.  Use good posture when sitting. Make sure your head rests over your shoulders and is not hanging forward. Use a pillow on your lower back if necessary.  Try sleeping on your side, preferably the left side, with a pillow or two between your legs. If you are sore after a night's rest, your bed may be too soft. A firm mattress may provide more support for your back during pregnancy. General instructions  Do not wear high heels.  Eat a healthy diet. Try to gain weight within your health care provider's recommendations.  Use a maternity girdle, elastic sling, or back brace as told by your  health care provider.  Take over-the-counter and prescription medicines only as told by your health care provider.  Keep all follow-up visits as told by your health care provider. This is important. This includes any visits with any specialists, such as a physical therapist. Contact a health care provider if:  Your back pain interferes with your daily activities.  You have increasing pain in other parts of your body. Get help right away if:  You develop numbness, tingling, weakness, or problems with the use of your arms or legs.  You develop severe back pain that is not controlled with medicine.  You have a sudden change in bowel or bladder control.  You develop shortness of breath, dizziness, or you faint.  You develop nausea, vomiting, or sweating.  You have back pain that is a rhythmic, cramping pain similar to labor pains. Labor pain is usually 1-2 minutes apart, lasts for about 1 minute, and involves a bearing down feeling or pressure in your pelvis.  You have back pain and your water breaks or you have vaginal bleeding.  You have back pain or numbness that travels down your leg.  Your back pain developed after you fell.  You develop pain on one side of your back.  You see blood in your urine.  You develop skin blisters in the area of your back pain. This information is not intended to replace advice given to   you by your health care provider. Make sure you discuss any questions you have with your health care provider. Document Released: 12/14/2005 Document Revised: 02/11/2016 Document Reviewed: 05/20/2015  2017 Elsevier  

## 2016-09-04 NOTE — MAU Note (Signed)
Back pain that comes and goes since Friday and has gotten worse and more painful.  Denies LOF/VB.  Heating pad seems to help a little bit.

## 2016-09-04 NOTE — Progress Notes (Signed)
Esignature pad not working, signed on paper copy and put in chart.

## 2016-09-05 LAB — GC/CHLAMYDIA PROBE AMP (~~LOC~~) NOT AT ARMC
Chlamydia: NEGATIVE
NEISSERIA GONORRHEA: NEGATIVE

## 2016-09-28 ENCOUNTER — Encounter (HOSPITAL_COMMUNITY): Payer: Self-pay | Admitting: *Deleted

## 2016-09-28 ENCOUNTER — Inpatient Hospital Stay (HOSPITAL_COMMUNITY)
Admission: AD | Admit: 2016-09-28 | Discharge: 2016-09-28 | Disposition: A | Discharge status: Home / Self Care | Source: Ambulatory Visit | Attending: Obstetrics and Gynecology | Admitting: Obstetrics and Gynecology

## 2016-09-28 DIAGNOSIS — B3731 Acute candidiasis of vulva and vagina: Secondary | ICD-10-CM

## 2016-09-28 DIAGNOSIS — Z3A33 33 weeks gestation of pregnancy: Secondary | ICD-10-CM | POA: Diagnosis not present

## 2016-09-28 DIAGNOSIS — B373 Candidiasis of vulva and vagina: Secondary | ICD-10-CM | POA: Diagnosis not present

## 2016-09-28 DIAGNOSIS — O98813 Other maternal infectious and parasitic diseases complicating pregnancy, third trimester: Secondary | ICD-10-CM

## 2016-09-28 DIAGNOSIS — O163 Unspecified maternal hypertension, third trimester: Secondary | ICD-10-CM

## 2016-09-28 DIAGNOSIS — O133 Gestational [pregnancy-induced] hypertension without significant proteinuria, third trimester: Secondary | ICD-10-CM | POA: Insufficient documentation

## 2016-09-28 DIAGNOSIS — O4703 False labor before 37 completed weeks of gestation, third trimester: Secondary | ICD-10-CM | POA: Diagnosis not present

## 2016-09-28 LAB — WET PREP, GENITAL
CLUE CELLS WET PREP: NONE SEEN
Sperm: NONE SEEN
TRICH WET PREP: NONE SEEN
Yeast Wet Prep HPF POC: NONE SEEN

## 2016-09-28 LAB — COMPREHENSIVE METABOLIC PANEL
ALBUMIN: 3.1 g/dL — AB (ref 3.5–5.0)
ALK PHOS: 116 U/L (ref 38–126)
ALT: 11 U/L — ABNORMAL LOW (ref 14–54)
ANION GAP: 11 (ref 5–15)
AST: 17 U/L (ref 15–41)
BUN: 6 mg/dL (ref 6–20)
CALCIUM: 9.2 mg/dL (ref 8.9–10.3)
CO2: 18 mmol/L — AB (ref 22–32)
Chloride: 104 mmol/L (ref 101–111)
Creatinine, Ser: 0.5 mg/dL (ref 0.44–1.00)
GFR calc Af Amer: >60 mL/min (ref >60)
GFR calc non Af Amer: >60 mL/min (ref >60)
GLUCOSE: 96 mg/dL (ref 65–99)
Potassium: 3.6 mmol/L (ref 3.5–5.1)
SODIUM: 133 mmol/L — AB (ref 135–145)
Total Bilirubin: 0.3 mg/dL (ref 0.3–1.2)
Total Protein: 6.4 g/dL — ABNORMAL LOW (ref 6.5–8.1)

## 2016-09-28 LAB — URINALYSIS, ROUTINE W REFLEX MICROSCOPIC
Bilirubin Urine: NEGATIVE
GLUCOSE, UA: NEGATIVE mg/dL
Hgb urine dipstick: NEGATIVE
KETONES UR: NEGATIVE mg/dL
Nitrite: NEGATIVE
PROTEIN: NEGATIVE mg/dL
Specific Gravity, Urine: 1.01 (ref 1.005–1.030)
pH: 5 (ref 5.0–8.0)

## 2016-09-28 LAB — CBC
HEMATOCRIT: 31.8 % — AB (ref 36.0–46.0)
HEMOGLOBIN: 11.2 g/dL — AB (ref 12.0–15.0)
MCH: 31.5 pg (ref 26.0–34.0)
MCHC: 35.2 g/dL (ref 30.0–36.0)
MCV: 89.3 fL (ref 78.0–100.0)
Platelets: 299 10*3/uL (ref 150–400)
RBC: 3.56 MIL/uL — AB (ref 3.87–5.11)
RDW: 12.7 % (ref 11.5–15.5)
WBC: 14.2 10*3/uL — AB (ref 4.0–10.5)

## 2016-09-28 LAB — PROTEIN / CREATININE RATIO, URINE
Creatinine, Urine: 51 mg/dL
Total Protein, Urine: <6 mg/dL

## 2016-09-28 LAB — POCT FERN TEST: POCT FERN TEST: NEGATIVE

## 2016-09-28 LAB — FETAL FIBRONECTIN: FETAL FIBRONECTIN: NEGATIVE

## 2016-09-28 MED ORDER — TERCONAZOLE 0.4 % VA CREA: 1.0000 | TOPICAL_CREAM | Freq: Every day | VAGINAL | 0 refills | Status: DC

## 2016-09-28 NOTE — MAU Note (Signed)
Pt reports contractions since last pm, worsening now.

## 2016-09-28 NOTE — MAU Provider Note (Signed)
CC:  Chief Complaint  Patient presents with  . Contractions     First Provider Initiated Contact with Patient 09/28/16 [phone]     HPI: Morgan Chang is a 21 y.o. year old G75P0020 female at [redacted]w[redacted]d weeks gestation who presents to MAU reporting worsening  contractions since last night. Unsure how frequent. Has been evaluated previously for PTL and states she was told hr cervix was FT dilated.   Associated Sx:  Vaginal bleeding: Scant brown spotting a few days ago Leaking of fluid: Leaking small amount of clear-yellow fluid and mucus since 09/25/16.  Fetal movement: Nml  Incidentally noted to have elevated BP. History of hypertension before during pregnancy. Associated symptoms: Negative for headache, vision changes or epigastric pain.  O: Patient Vitals for the past 24 hrs:  BP Temp Temp src Pulse Resp SpO2  09/28/16 0630 122/96 - - 96 - -  09/28/16 0615 122/85 - - 100 - -  09/28/16 0600 122/85 - - 91 - -  09/28/16 0545 119/83 - - 95 - -  09/28/16 0530 122/91 - - 96 - -  09/28/16 0529 122/93 - - 95 - -  09/28/16 0523 129/95 97.7 F (36.5 C) Oral 98 20 99 %    General: NAD Heart: Regular rate Lungs: Normal rate and effort Abd: Soft, NT, Gravid, S=D Pelvic: NEFG, Neg pooling, No blood. Cervix looks friable. Moderate amount of thick, curd-like discharge and mucus.  Dilation: Closed Effacement (%): Thick, 50 Cervical Position: Posterior Station: -3 Exam by:: Marlou Porch CNM  EFM: 140, reactive Toco: Contractions every 3-7 minutes, mild  Results for orders placed or performed during the hospital encounter of 09/28/16 (from the past 24 hour(s))  Urinalysis, Routine w reflex microscopic     Status: Abnormal   Collection Time: 09/28/16  5:11 AM  Result Value Ref Range   Color, Urine YELLOW YELLOW   APPearance HAZY (A) CLEAR   Specific Gravity, Urine 1.010 1.005 - 1.030   pH 5.0 5.0 - 8.0   Glucose, UA NEGATIVE NEGATIVE mg/dL   Hgb urine dipstick NEGATIVE NEGATIVE   Bilirubin Urine  NEGATIVE NEGATIVE   Ketones, ur NEGATIVE NEGATIVE mg/dL   Protein, ur NEGATIVE NEGATIVE mg/dL   Nitrite NEGATIVE NEGATIVE   Leukocytes, UA TRACE (A) NEGATIVE   RBC / HPF 0-5 0 - 5 RBC/hpf   WBC, UA 0-5 0 - 5 WBC/hpf   Bacteria, UA RARE (A) NONE SEEN   Squamous Epithelial / LPF 0-5 (A) NONE SEEN   Mucous PRESENT   Protein / creatinine ratio, urine     Status: None   Collection Time: 09/28/16  5:11 AM  Result Value Ref Range   Creatinine, Urine 51.00 mg/dL   Total Protein, Urine <6 mg/dL   Protein Creatinine Ratio        0.00 - 0.15 mg/mg[Cre]  Fetal fibronectin     Status: None   Collection Time: 09/28/16  5:40 AM  Result Value Ref Range   Fetal Fibronectin NEGATIVE NEGATIVE  Wet prep, genital     Status: Abnormal   Collection Time: 09/28/16  5:40 AM  Result Value Ref Range   Yeast Wet Prep HPF POC NONE SEEN NONE SEEN   Trich, Wet Prep NONE SEEN NONE SEEN   Clue Cells Wet Prep HPF POC NONE SEEN NONE SEEN   WBC, Wet Prep HPF POC MANY (A) NONE SEEN   Sperm NONE SEEN   CBC     Status: Abnormal   Collection Time: 09/28/16  6:07 AM  Result Value Ref Range   WBC 14.2 (H) 4.0 - 10.5 K/uL   RBC 3.56 (L) 3.87 - 5.11 MIL/uL   Hemoglobin 11.2 (L) 12.0 - 15.0 g/dL   HCT 31.8 (L) 36.0 - 46.0 %   MCV 89.3 78.0 - 100.0 fL   MCH 31.5 26.0 - 34.0 pg   MCHC 35.2 30.0 - 36.0 g/dL   RDW 12.7 11.5 - 15.5 %   Platelets 299 150 - 400 K/uL  Comprehensive metabolic panel     Status: Abnormal   Collection Time: 09/28/16  6:07 AM  Result Value Ref Range   Sodium 133 (L) 135 - 145 mmol/L   Potassium 3.6 3.5 - 5.1 mmol/L   Chloride 104 101 - 111 mmol/L   CO2 18 (L) 22 - 32 mmol/L   Glucose, Bld 96 65 - 99 mg/dL   BUN 6 6 - 20 mg/dL   Creatinine, Ser 0.50 0.44 - 1.00 mg/dL   Calcium 9.2 8.9 - 10.3 mg/dL   Total Protein 6.4 (L) 6.5 - 8.1 g/dL   Albumin 3.1 (L) 3.5 - 5.0 g/dL   AST 17 15 - 41 U/L   ALT 11 (L) 14 - 54 U/L   Alkaline Phosphatase 116 38 - 126 U/L   Total Bilirubin 0.3 0.3 - 1.2  mg/dL   GFR calc non Af Amer >60 >60 mL/min   GFR calc Af Amer >60 >60 mL/min   Anion gap 11 5 - 15  POCT fern test     Status: Normal   Collection Time: 09/28/16  6:17 AM  Result Value Ref Range   POCT Fern Test Negative = intact amniotic membranes      Orders Placed This Encounter  Procedures  . Wet prep, genital  . Urinalysis, Routine w reflex microscopic  . CBC  . Comprehensive metabolic panel  . Protein / creatinine ratio, urine  . Fetal fibronectin  . POCT fern test   Discuss history, exam, blood pressures with Dr. Melba Coon will check fetal fibronectin and preeclampsia labs. Discharge home if all are negative.  A:  - [redacted]w[redacted]d week IUP - Preterm contractions without evidence of active preterm labor. Fetal fibronectin negative. - FHR reactive - Transient hypertension without evidence of preeclampsia  P: Discharge home in stable condition per Dr. Melba Coon. Preterm labor precautions and fetal kick counts. Preeclampsia precautions. Rx Terazol Follow-up as scheduled for prenatal visit or sooner as needed if symptoms worsen. Return to maternity admissions as needed if symptoms worsen.  [place], CNM 09/12/2016 11:40 PM  3

## 2016-09-28 NOTE — Discharge Instructions (Signed)
Braxton Hicks Contractions °Contractions of the uterus can occur throughout pregnancy. Contractions are not always a sign that you are in labor.  °WHAT ARE BRAXTON HICKS CONTRACTIONS?  °Contractions that occur before labor are called Braxton Hicks contractions, or false labor. Toward the end of pregnancy (32-34 weeks), these contractions can develop more often and may become more forceful. This is not true labor because these contractions do not result in opening (dilatation) and thinning of the cervix. They are sometimes difficult to tell apart from true labor because these contractions can be forceful and people have different pain tolerances. You should not feel embarrassed if you go to the hospital with false labor. Sometimes, the only way to tell if you are in true labor is for your health care provider to look for changes in the cervix. °If there are no prenatal problems or other health problems associated with the pregnancy, it is completely safe to be sent home with false labor and await the onset of true labor. °HOW CAN YOU TELL THE DIFFERENCE BETWEEN TRUE AND FALSE LABOR? °False Labor  °· The contractions of false labor are usually shorter and not as hard as those of true labor.   °· The contractions are usually irregular.   °· The contractions are often felt in the front of the lower abdomen and in the groin.   °· The contractions may go away when you walk around or change positions while lying down.   °· The contractions get weaker and are shorter lasting as time goes on.   °· The contractions do not usually become progressively stronger, regular, and closer together as with true labor.   °True Labor  °· Contractions in true labor last 30-70 seconds, become very regular, usually become more intense, and increase in frequency.   °· The contractions do not go away with walking.   °· The discomfort is usually felt in the top of the uterus and spreads to the lower abdomen and low back.   °· True labor can be  determined by your health care provider with an exam. This will show that the cervix is dilating and getting thinner.   °WHAT TO REMEMBER °· Keep up with your usual exercises and follow other instructions given by your health care provider.   °· Take medicines as directed by your health care provider.   °· Keep your regular prenatal appointments.   °· Eat and drink lightly if you think you are going into labor.   °· If Braxton Hicks contractions are making you uncomfortable:   °¨ Change your position from lying down or resting to walking, or from walking to resting.   °¨ Sit and rest in a tub of warm water.   °¨ Drink 2-3 glasses of water. Dehydration may cause these contractions.   °¨ Do slow and deep breathing several times an hour.   °WHEN SHOULD I SEEK IMMEDIATE MEDICAL CARE? °Seek immediate medical care if: °· Your contractions become stronger, more regular, and closer together.   °· You have fluid leaking or gushing from your vagina.   °· You have a fever.   °· You pass blood-tinged mucus.   °· You have vaginal bleeding.   °· You have continuous abdominal pain.   °· You have low back pain that you never had before.   °· You feel your baby's head pushing down and causing pelvic pressure.   °· Your baby is not moving as much as it used to.   °This information is not intended to replace advice given to you by your health care provider. Make sure you discuss any questions you have with your health care   provider. Document Released: 09/05/2005 Document Revised: 12/28/2015 Document Reviewed: 06/17/2013 Elsevier Interactive Patient Education  2017 [place].    Hypertension During Pregnancy Hypertension, commonly called high blood pressure, is when the force of blood pumping through your arteries is too strong. Arteries are blood vessels that carry blood from the heart throughout the body. Hypertension during pregnancy can cause problems for you and your baby. Your baby may be born early (prematurely) or  may not weigh as much as he or she should at birth. Very bad cases of hypertension during pregnancy can be life-threatening. Different types of hypertension can occur during pregnancy. These include:  Chronic hypertension. This happens when:  You have hypertension before pregnancy and it continues during pregnancy.  You develop hypertension before you are [redacted] weeks pregnant, and it continues during pregnancy.  Gestational hypertension. This is hypertension that develops after the 20th week of pregnancy.  Preeclampsia, also called toxemia of pregnancy. This is a very serious type of hypertension that develops only during pregnancy. It affects the whole body, and it can be very dangerous for you and your baby. Gestational hypertension and preeclampsia usually go away within 6 weeks after your baby is born. Women who have hypertension during pregnancy have a greater chance of developing hypertension later in life or during future pregnancies. What are the causes? The exact cause of hypertension is not known. What increases the risk? There are certain factors that make it more likely for you to develop hypertension during pregnancy. These include:  Having hypertension during a previous pregnancy or prior to pregnancy.  Being overweight.  Being older than age 64.  Being pregnant for the first time or being pregnant with more than one baby.  Becoming pregnant using fertilization methods such as IVF (in vitro fertilization).  Having diabetes, kidney problems, or systemic lupus erythematosus.  Having a family history of hypertension. What are the signs or symptoms? Chronic hypertension and gestational hypertension rarely cause symptoms. Preeclampsia causes symptoms, which may include:  Increased protein in your urine. Your health care provider will check for this at every visit before you give birth (prenatal visit).  Severe headaches.  Sudden weight gain.  Swelling of the hands, face,  legs, and feet.  Nausea and vomiting.  Vision problems, such as blurred or double vision.  Numbness in the face, arms, legs, and feet.  Dizziness.  Slurred speech.  Sensitivity to bright lights.  Abdominal pain.  Convulsions. How is this diagnosed? You may be diagnosed with hypertension during a routine prenatal exam. At each prenatal visit, you may:  Have a urine test to check for high amounts of protein in your urine.  Have your blood pressure checked. A blood pressure reading is recorded as two numbers, such as "120 over 80" (or 120/80). The first ("top") number is called the systolic pressure. It is a measure of the pressure in your arteries when your heart beats. The second ("bottom") number is called the diastolic pressure. It is a measure of the pressure in your arteries as your heart relaxes between beats. Blood pressure is measured in a unit called mm Hg. A normal blood pressure reading is:  Systolic: below [id number].  Diastolic: below 80. The type of hypertension that you are diagnosed with depends on your test results and when your symptoms developed.  Chronic hypertension is usually diagnosed before 20 weeks of pregnancy.  Gestational hypertension is usually diagnosed after 20 weeks of pregnancy.  Hypertension with high amounts of protein in the urine  is diagnosed as preeclampsia.  Blood pressure measurements that stay above 0000000 systolic, or above [id number] diastolic, are signs of severe preeclampsia. How is this treated? Treatment for hypertension during pregnancy varies depending on the type of hypertension you have and how serious it is.  If you take medicines called ACE inhibitors to treat chronic hypertension, you may need to switch medicines. ACE inhibitors should not be taken during pregnancy.  If you have gestational hypertension, you may need to take blood pressure medicine.  If you are at risk for preeclampsia, your health care provider may recommend that you take  a low-dose aspirin every day to prevent high blood pressure during your pregnancy.  If you have severe preeclampsia, you may need to be hospitalized so you and your baby can be monitored closely. You may also need to take medicine (magnesium sulfate) to prevent seizures and to lower blood pressure. This medicine may be given as an injection or through an IV tube.  In some cases, if your condition gets worse, you may need to deliver your baby early. Follow these instructions at home: Eating and drinking  Drink enough fluid to keep your urine clear or pale yellow.  Eat a healthy diet that is low in salt (sodium). Do not add salt to your food. Check food labels to see how much sodium a food or beverage contains. Lifestyle  Do not use any products that contain nicotine or tobacco, such as cigarettes and e-cigarettes. If you need help quitting, ask your health care provider.  Do not use alcohol.  Avoid caffeine.  Avoid stress as much as possible. Rest and get plenty of sleep. General instructions  Take over-the-counter and prescription medicines only as told by your health care provider.  While lying down, lie on your left side. This keeps pressure off your baby.  While sitting or lying down, raise (elevate) your feet. Try putting some pillows under your lower legs.  Exercise regularly. Ask your health care provider what kinds of exercise are best for you.  Keep all prenatal and follow-up visits as told by your health care provider. This is important. Contact a health care provider if:  You have symptoms that your health care provider told you may require more treatment or monitoring, such as:  Fever.  Vomiting.  Headache. Get help right away if:  You have severe abdominal pain or vomiting that does not get better with treatment.  You suddenly develop swelling in your hands, ankles, or face.  You gain 4 lbs (1.8 kg) or more in 1 week.  You develop vaginal bleeding, or you  have blood in your urine.  You do not feel your baby moving as much as usual.  You have blurred or double vision.  You have muscle twitching or sudden tightening (spasms).  You have shortness of breath.  Your lips or fingernails turn blue. This information is not intended to replace advice given to you by your health care provider. Make sure you discuss any questions you have with your health care provider. Document Released: 05/24/2011 Document Revised: 03/25/2016 Document Reviewed: 02/19/2016 Elsevier Interactive Patient Education  2017 [employer].

## 2016-10-05 ENCOUNTER — Other Ambulatory Visit (HOSPITAL_COMMUNITY): Payer: Self-pay | Admitting: Obstetrics and Gynecology

## 2016-10-05 ENCOUNTER — Ambulatory Visit (HOSPITAL_COMMUNITY)
Admission: RE | Admit: 2016-10-05 | Discharge: 2016-10-05 | Disposition: A | Discharge status: Home / Self Care | Source: Ambulatory Visit | Attending: Obstetrics and Gynecology | Admitting: Obstetrics and Gynecology

## 2016-10-05 DIAGNOSIS — M79602 Pain in left arm: Secondary | ICD-10-CM | POA: Diagnosis not present

## 2016-10-05 DIAGNOSIS — M7989 Other specified soft tissue disorders: Secondary | ICD-10-CM

## 2016-10-05 NOTE — Progress Notes (Signed)
**  Preliminary report by tech**  Left lower extremity venous duplex complete. There is no evidence of deep or superficial vein thrombosis involving the left lower extremity. All visualized vessels appear patent and compressible. There is no evidence of a Baker's cyst on the left. Results were given to Dr. Lorette Ang.  10/05/16 11:23 AM Morgan Chang RVT

## 2016-10-12 ENCOUNTER — Encounter (HOSPITAL_COMMUNITY): Payer: Self-pay

## 2016-10-12 ENCOUNTER — Inpatient Hospital Stay (HOSPITAL_COMMUNITY)
Admission: AD | Admit: 2016-10-12 | Discharge: 2016-10-12 | Disposition: A | Discharge status: Home / Self Care | Source: Ambulatory Visit | Attending: Obstetrics and Gynecology | Admitting: Obstetrics and Gynecology

## 2016-10-12 DIAGNOSIS — O133 Gestational [pregnancy-induced] hypertension without significant proteinuria, third trimester: Secondary | ICD-10-CM | POA: Diagnosis not present

## 2016-10-12 DIAGNOSIS — R102 Pelvic and perineal pain: Secondary | ICD-10-CM | POA: Diagnosis present

## 2016-10-12 DIAGNOSIS — Z3A35 35 weeks gestation of pregnancy: Secondary | ICD-10-CM

## 2016-10-12 DIAGNOSIS — O4703 False labor before 37 completed weeks of gestation, third trimester: Secondary | ICD-10-CM | POA: Insufficient documentation

## 2016-10-12 DIAGNOSIS — O479 False labor, unspecified: Secondary | ICD-10-CM

## 2016-10-12 HISTORY — DX: Anxiety disorder, unspecified: F41.9

## 2016-10-12 HISTORY — DX: Major depressive disorder, single episode, unspecified: F32.9

## 2016-10-12 HISTORY — DX: Depression, unspecified: F32.A

## 2016-10-12 LAB — URINALYSIS, ROUTINE W REFLEX MICROSCOPIC
BILIRUBIN URINE: NEGATIVE
Glucose, UA: NEGATIVE mg/dL
Hgb urine dipstick: NEGATIVE
KETONES UR: NEGATIVE mg/dL
Nitrite: NEGATIVE
PROTEIN: NEGATIVE mg/dL
Specific Gravity, Urine: 1.01 (ref 1.005–1.030)
pH: 6 (ref 5.0–8.0)

## 2016-10-12 LAB — COMPREHENSIVE METABOLIC PANEL
ALT: 10 U/L — AB (ref 14–54)
AST: 15 U/L (ref 15–41)
Albumin: 3 g/dL — ABNORMAL LOW (ref 3.5–5.0)
Alkaline Phosphatase: 142 U/L — ABNORMAL HIGH (ref 38–126)
Anion gap: 9 (ref 5–15)
BUN: 7 mg/dL (ref 6–20)
CHLORIDE: 106 mmol/L (ref 101–111)
CO2: 19 mmol/L — AB (ref 22–32)
CREATININE: 0.57 mg/dL (ref 0.44–1.00)
Calcium: 8.7 mg/dL — ABNORMAL LOW (ref 8.9–10.3)
GFR calc non Af Amer: >60 mL/min (ref >60)
Glucose, Bld: 103 mg/dL — ABNORMAL HIGH (ref 65–99)
Potassium: 3.7 mmol/L (ref 3.5–5.1)
SODIUM: 134 mmol/L — AB (ref 135–145)
Total Bilirubin: 0.5 mg/dL (ref 0.3–1.2)
Total Protein: 6.2 g/dL — ABNORMAL LOW (ref 6.5–8.1)

## 2016-10-12 LAB — CBC
HCT: 30.9 % — ABNORMAL LOW (ref 36.0–46.0)
Hemoglobin: 10.8 g/dL — ABNORMAL LOW (ref 12.0–15.0)
MCH: 30.8 pg (ref 26.0–34.0)
MCHC: 35 g/dL (ref 30.0–36.0)
MCV: 88 fL (ref 78.0–100.0)
PLATELETS: 292 10*3/uL (ref 150–400)
RBC: 3.51 MIL/uL — AB (ref 3.87–5.11)
RDW: 13 % (ref 11.5–15.5)
WBC: 14.4 10*3/uL — ABNORMAL HIGH (ref 4.0–10.5)

## 2016-10-12 LAB — PROTEIN / CREATININE RATIO, URINE: Creatinine, Urine: 55 mg/dL

## 2016-10-12 MED ORDER — ACETAMINOPHEN 500 MG PO TABS
1000.0000 mg | ORAL_TABLET | Freq: Once | ORAL | Status: AC
  Administered 2016-10-12: 1000 mg via ORAL
  Filled 2016-10-12: qty 2

## 2016-10-12 NOTE — MAU Note (Signed)
Pt reports she is contracting q 5 minutes.

## 2016-10-12 NOTE — MAU Provider Note (Signed)
History     CSN: [id number]  Arrival date and time: 10/12/16 2046   First Provider Initiated Contact with Patient 10/12/16 2149      Chief Complaint  Patient presents with  . Contractions  . Pelvic Pain   Last seen on 10/12/15. Baby was breech on [place] on 10/11/15. She reports that she has had some elevated blood pressure. She reports that she has increased swelling. She has a HA today, and took tylenol around 1300, but it did not help. Also having visual disturbances, seeing "specks". She reports that she has had headaches off and on for about one week now.    Pelvic Pain  The patient's primary symptoms include pelvic pain. The patient's pertinent negatives include no vaginal discharge. This is a new problem. The current episode started today (around 1800 ). The problem occurs intermittently (about every 5 mins ). The problem has been unchanged. Pain severity now: 7/10  The problem affects both sides. She is pregnant. Associated symptoms include headaches. The vaginal discharge was normal (no leaking of fluid ). There has been no bleeding.   Past Medical History:  Diagnosis Date  . Anxiety   . Depression   . Migraines     Past Surgical History:  Procedure Laterality Date  . ADENOIDECTOMY  10/29/2007  . BREAST REDUCTION SURGERY    . LAPAROSCOPY  12/10/2014  . NISSEN FUNDOPLICATION    . RHINOPLASTY  03/22/2010   deviated septum  . TONSILLECTOMY  2017    History reviewed. No pertinent family history.  Social History  Substance Use Topics  . Smoking status: Never Smoker  . Smokeless tobacco: Never Used  . Alcohol use No    Allergies: No Known Allergies  Prescriptions Prior to Admission  Medication Sig Dispense Refill Last Dose  . acetaminophen (TYLENOL) 325 MG tablet Take 1,000 mg by mouth every 6 (six) hours as needed for mild pain, moderate pain or headache.    10/12/2016 at 1300  . pantoprazole (PROTONIX) 20 MG tablet Take 20 mg by mouth at bedtime.   10/11/2016 at  Unknown time  . Prenatal MV-Min-FA-Omega-3 (PRENATAL GUMMIES/DHA & FA) 0.4-32.5 MG CHEW Chew 2 each by mouth at bedtime.   10/11/2016 at Unknown time  . sertraline (ZOLOFT) 50 MG tablet Take 50 mg by mouth at bedtime.   10/11/2016 at Unknown time  . cyclobenzaprine (FLEXERIL) 10 MG tablet Take 1 tablet (10 mg total) by mouth 3 (three) times daily as needed for muscle spasms. Use sparingly 30 tablet 0   . terconazole (TERAZOL 7) 0.4 % vaginal cream Place 1 applicator vaginally at bedtime. 45 g 0     Review of Systems  Eyes: Positive for visual disturbance.  Genitourinary: Positive for pelvic pain. Negative for vaginal discharge.  Neurological: Positive for headaches.   Physical Exam   Blood pressure 131/90, pulse 100, temperature 98.7 F (37.1 C), temperature source Oral, resp. rate 22, height 5\' 6"  (1.676 m), weight 202 lb (91.6 kg), last menstrual period 02/10/2016, SpO2 98 %.  Physical Exam  Nursing note and vitals reviewed. Constitutional: She is oriented to person, place, and time. She appears well-developed and well-nourished. No distress.  HENT:  Head: Normocephalic.  Cardiovascular: Normal rate.   Respiratory: Effort normal.  GI: Soft. There is no tenderness. There is no rebound.  Musculoskeletal: She exhibits no edema.  Neurological: She is alert and oriented to person, place, and time. She has normal reflexes. She displays normal reflexes. She exhibits normal muscle tone.  Results for orders placed or performed during the hospital encounter of 10/12/16 (from the past 24 hour(s))  Urinalysis, Routine w reflex microscopic     Status: Abnormal   Collection Time: 10/12/16  9:34 PM  Result Value Ref Range   Color, Urine YELLOW YELLOW   APPearance CLEAR CLEAR   Specific Gravity, Urine 1.010 1.005 - 1.030   pH 6.0 5.0 - 8.0   Glucose, UA NEGATIVE NEGATIVE mg/dL   Hgb urine dipstick NEGATIVE NEGATIVE   Bilirubin Urine NEGATIVE NEGATIVE   Ketones, ur NEGATIVE NEGATIVE mg/dL    Protein, ur NEGATIVE NEGATIVE mg/dL   Nitrite NEGATIVE NEGATIVE   Leukocytes, UA SMALL (A) NEGATIVE   RBC / HPF 0-5 0 - 5 RBC/hpf   WBC, UA 0-5 0 - 5 WBC/hpf   Bacteria, UA RARE (A) NONE SEEN   Squamous Epithelial / LPF 0-5 (A) NONE SEEN   Mucous PRESENT   Protein / creatinine ratio, urine     Status: None   Collection Time: 10/12/16  9:34 PM  Result Value Ref Range   Creatinine, Urine 55.00 mg/dL   Total Protein, Urine <6 mg/dL   Protein Creatinine Ratio        0.00 - 0.15 mg/mg[Cre]  CBC     Status: Abnormal   Collection Time: 10/12/16 10:08 PM  Result Value Ref Range   WBC 14.4 (H) 4.0 - 10.5 K/uL   RBC 3.51 (L) 3.87 - 5.11 MIL/uL   Hemoglobin 10.8 (L) 12.0 - 15.0 g/dL   HCT 30.9 (L) 36.0 - 46.0 %   MCV 88.0 78.0 - 100.0 fL   MCH 30.8 26.0 - 34.0 pg   MCHC 35.0 30.0 - 36.0 g/dL   RDW 13.0 11.5 - 15.5 %   Platelets 292 150 - 400 K/uL  Comprehensive metabolic panel     Status: Abnormal   Collection Time: 10/12/16 10:08 PM  Result Value Ref Range   Sodium 134 (L) 135 - 145 mmol/L   Potassium 3.7 3.5 - 5.1 mmol/L   Chloride 106 101 - 111 mmol/L   CO2 19 (L) 22 - 32 mmol/L   Glucose, Bld 103 (H) 65 - 99 mg/dL   BUN 7 6 - 20 mg/dL   Creatinine, Ser 0.57 0.44 - 1.00 mg/dL   Calcium 8.7 (L) 8.9 - 10.3 mg/dL   Total Protein 6.2 (L) 6.5 - 8.1 g/dL   Albumin 3.0 (L) 3.5 - 5.0 g/dL   AST 15 15 - 41 U/L   ALT 10 (L) 14 - 54 U/L   Alkaline Phosphatase 142 (H) 38 - 126 U/L   Total Bilirubin 0.5 0.3 - 1.2 mg/dL   GFR calc non Af Amer >60 >60 mL/min   GFR calc Af Amer >60 >60 mL/min   Anion gap 9 5 - 15    MAU Course  Procedures  MDM 2304: D/W Dr. Terri Piedra, Will dc home with 24 urine collection, FU in the office on Friday.   Assessment and Plan   1. Pregnancy-induced hypertension in third trimester   2. [redacted] weeks gestation of pregnancy   3. Braxton Hicks contractions    DC home Comfort measures reviewed  3rd Trimester precautions  PTL precautions  Fetal kick counts RX:  none  Return to MAU as needed FU with OB as planned  Follow-up [place], DO Follow up.   Specialty:  Obstetrics and Gynecology Why:  Complete 24 hour urine collection, and return to the office on  Friday for blood pressure check.  Contact information: [place] [address] [phone]            Mathis Bud 10/12/2016, 9:51 PM

## 2016-10-12 NOTE — MAU Note (Signed)
Pt c/o contractions since 1815-every 5 mins . Denies LOF or vag bleeding. +FM. Pt is states that baby is breech. Pt states she was 1cm on Monday.

## 2016-10-12 NOTE — Discharge Instructions (Signed)
Preeclampsia and Eclampsia  Preeclampsia is a serious condition that develops only during pregnancy. It is also called toxemia of pregnancy. This condition causes high blood pressure along with other symptoms, such as swelling and headaches. These symptoms may develop as the condition gets worse. Preeclampsia may occur at 20 weeks of pregnancy or later.  Diagnosing and treating preeclampsia early is very important. If not treated early, it can cause serious problems for you and your baby. One problem it can lead to is eclampsia, which is a condition that causes muscle jerking or shaking (convulsions or seizures) in the mother. Delivering your baby is the best treatment for preeclampsia or eclampsia. Preeclampsia and eclampsia symptoms usually go away after your baby is born.  What are the causes?  The cause of preeclampsia is not known.  What increases the risk?  The following risk factors make you more likely to develop preeclampsia:  · Being pregnant for the first time.  · Having had preeclampsia during a past pregnancy.  · Having a family history of preeclampsia.  · Having high blood pressure.  · Being pregnant with twins or triplets.  · Being 35 or older.  · Being African-American.  · Having kidney disease or diabetes.  · Having medical conditions such as lupus or blood diseases.  · Being very overweight (obese).    What are the signs or symptoms?  The earliest signs of preeclampsia are:  · High blood pressure.  · Increased protein in your urine. Your health care provider will check for this at every visit before you give birth (prenatal visit).    Other symptoms that may develop as the condition gets worse include:  · Severe headaches.  · Sudden weight gain.  · Swelling of the hands, face, legs, and feet.  · Nausea and vomiting.  · Vision problems, such as blurred or double vision.  · Numbness in the face, arms, legs, and feet.  · Urinating less than usual.  · Dizziness.  · Slurred speech.  · Abdominal pain,  especially upper abdominal pain.  · Convulsions or seizures.    Symptoms generally go away after giving birth.  How is this diagnosed?  There are no screening tests for preeclampsia. Your health care provider will ask you about symptoms and check for signs of preeclampsia during your prenatal visits. You may also have tests that include:  · Urine tests.  · Blood tests.  · Checking your blood pressure.  · Monitoring your baby’s heart rate.  · Ultrasound.    How is this treated?  You and your health care provider will determine the treatment approach that is best for you. Treatment may include:  · Having more frequent prenatal exams to check for signs of preeclampsia, if you have an increased risk for preeclampsia.  · Bed rest.  · Reducing how much salt (sodium) you eat.  · Medicine to lower your blood pressure.  · Staying in the hospital, if your condition is severe. There, treatment will focus on controlling your blood pressure and the amount of fluids in your body (fluid retention).  · You may need to take medicine (magnesium sulfate) to prevent seizures. This medicine may be given as an injection or through an IV tube.  · Delivering your baby early, if your condition gets worse. You may have your labor started with medicine (induced), or you may have a cesarean delivery.    Follow these instructions at home:  Eating and drinking     ·   quitting, ask your health care provider.  Do not use alcohol or drugs.  Avoid stress as much as possible. Rest and get plenty of sleep. General instructions  Take over-the-counter and prescription medicines only as told by your health  care provider.  When lying down, lie on your side. This keeps pressure off of your baby.  When sitting or lying down, raise (elevate) your feet. Try putting some pillows underneath your lower legs.  Exercise regularly. Ask your health care provider what kinds of exercise are best for you.  Keep all follow-up and prenatal visits as told by your health care provider. This is important. How is this prevented? To prevent preeclampsia or eclampsia from developing during another pregnancy:  Get proper medical care during pregnancy. Your health care provider may be able to prevent preeclampsia or diagnose and treat it early.  Your health care provider may have you take a low-dose aspirin or a calcium supplement during your next pregnancy.  You may have tests of your blood pressure and kidney function after giving birth.  Maintain a healthy weight. Ask your health care provider for help managing weight gain during pregnancy.  Work with your health care provider to manage any long-term (chronic) health conditions you have, such as diabetes or kidney problems. Contact a health care provider if:  You gain more weight than expected.  You have headaches.  You have nausea or vomiting.  You have abdominal pain.  You feel dizzy or light-headed. Get help right away if:  You develop sudden or severe swelling anywhere in your body. This usually happens in the legs.  You gain 5 lbs (2.3 kg) or more during one week.  You have severe:  Abdominal pain.  Headaches.  Dizziness.  Vision problems.  Confusion.  Nausea or vomiting.  You have a seizure.  You have trouble moving any part of your body.  You develop numbness in any part of your body.  You have trouble speaking.  You have any abnormal bleeding.  You pass out. This information is not intended to replace advice given to you by your health care provider. Make sure you discuss any questions you have with your health care  provider. Document Released: 09/02/2000 Document Revised: 05/03/2016 Document Reviewed: 04/11/2016 Elsevier Interactive Patient Education  2017 [employer]   [employer] of the uterus can occur throughout pregnancy. Contractions are not always a sign that you are in labor.  WHAT ARE BRAXTON HICKS CONTRACTIONS?  Contractions that occur before labor are called Braxton Hicks contractions, or false labor. Toward the end of pregnancy (32-34 weeks), these contractions can develop more often and may become more forceful. This is not true labor because these contractions do not result in opening (dilatation) and thinning of the cervix. They are sometimes difficult to tell apart from true labor because these contractions can be forceful and people have different pain tolerances. You should not feel embarrassed if you go to the hospital with false labor. Sometimes, the only way to tell if you are in true labor is for your health care provider to look for changes in the cervix. If there are no prenatal problems or other health problems associated with the pregnancy, it is completely safe to be sent home with false labor and await the onset of true labor. HOW CAN YOU TELL THE DIFFERENCE BETWEEN TRUE AND FALSE LABOR? False Labor   The contractions of false labor are usually shorter and not as hard as those of true  labor.   The contractions are usually irregular.   The contractions are often felt in the front of the lower abdomen and in the groin.   The contractions may go away when you walk around or change positions while lying down.   The contractions get weaker and are shorter lasting as time goes on.   The contractions do not usually become progressively stronger, regular, and closer together as with true labor.  True Labor   Contractions in true labor last 30-70 seconds, become very regular, usually become more intense, and increase in frequency.   The  contractions do not go away with walking.   The discomfort is usually felt in the top of the uterus and spreads to the lower abdomen and low back.   True labor can be determined by your health care provider with an exam. This will show that the cervix is dilating and getting thinner.  WHAT TO REMEMBER  Keep up with your usual exercises and follow other instructions given by your health care provider.   Take medicines as directed by your health care provider.   Keep your regular prenatal appointments.   Eat and drink lightly if you think you are going into labor.   If Braxton Hicks contractions are making you uncomfortable:   Change your position from lying down or resting to walking, or from walking to resting.   Sit and rest in a tub of warm water.   Drink 2-3 glasses of water. Dehydration may cause these contractions.   Do slow and deep breathing several times an hour.  WHEN SHOULD I SEEK IMMEDIATE MEDICAL CARE? Seek immediate medical care if:  Your contractions become stronger, more regular, and closer together.   You have fluid leaking or gushing from your vagina.   You have a fever.   You pass blood-tinged mucus.   You have vaginal bleeding.   You have continuous abdominal pain.   You have low back pain that you never had before.   You feel your baby's head pushing down and causing pelvic pressure.   Your baby is not moving as much as it used to.  This information is not intended to replace advice given to you by your health care provider. Make sure you discuss any questions you have with your health care provider. Document Released: 09/05/2005 Document Revised: 12/28/2015 Document Reviewed: 06/17/2013 Elsevier Interactive Patient Education  2017 [employer].

## 2016-10-17 ENCOUNTER — Telehealth (HOSPITAL_COMMUNITY): Payer: Self-pay | Admitting: *Deleted

## 2016-10-17 ENCOUNTER — Encounter (HOSPITAL_COMMUNITY): Payer: Self-pay | Admitting: *Deleted

## 2016-10-17 NOTE — Telephone Encounter (Signed)
Preadmission screen  

## 2016-10-26 ENCOUNTER — Encounter (HOSPITAL_COMMUNITY): Payer: Self-pay

## 2016-10-26 ENCOUNTER — Observation Stay (HOSPITAL_COMMUNITY)
Admission: RE | Admit: 2016-10-26 | Discharge: 2016-10-26 | Disposition: A | Discharge status: Home / Self Care | Source: Ambulatory Visit | Attending: Obstetrics and Gynecology | Admitting: Obstetrics and Gynecology

## 2016-10-26 DIAGNOSIS — Z3A37 37 weeks gestation of pregnancy: Secondary | ICD-10-CM

## 2016-10-26 DIAGNOSIS — O321XX0 Maternal care for breech presentation, not applicable or unspecified: Secondary | ICD-10-CM

## 2016-10-26 MED ORDER — TERBUTALINE SULFATE 1 MG/ML IJ SOLN
0.2500 mg | Freq: Once | INTRAMUSCULAR | Status: AC
  Administered 2016-10-26: 0.25 mg via SUBCUTANEOUS
  Filled 2016-10-26: qty 1

## 2016-10-26 NOTE — Procedures (Signed)
20yo, G1P0, EGA [redacted] weeks, here for ECV for breech presentation FHT reactive Ultrasound confirms breech, nl AFV Discussed procedure and risks, consent signed ECV attempted x 2 with forward roll, baby moved a little but pt with significant discomfort, so version was unsuccessful Will monitor for 30 minutes then d/c home if FHT reactive again Will schedule for c-section at 39 weeks

## 2016-10-29 ENCOUNTER — Inpatient Hospital Stay (HOSPITAL_COMMUNITY): Admitting: Anesthesiology

## 2016-10-29 ENCOUNTER — Inpatient Hospital Stay (HOSPITAL_COMMUNITY)
Admission: AD | Admit: 2016-10-29 | Discharge: 2016-11-01 | DRG: 766 | Disposition: A | Discharge status: Home / Self Care | Source: Ambulatory Visit | Attending: Obstetrics and Gynecology | Admitting: Obstetrics and Gynecology

## 2016-10-29 ENCOUNTER — Encounter (HOSPITAL_COMMUNITY)
Admission: AD | Disposition: A | Discharge status: Home / Self Care | Payer: Self-pay | Source: Ambulatory Visit | Attending: Obstetrics and Gynecology

## 2016-10-29 ENCOUNTER — Encounter (HOSPITAL_COMMUNITY): Payer: Self-pay | Admitting: *Deleted

## 2016-10-29 DIAGNOSIS — O134 Gestational [pregnancy-induced] hypertension without significant proteinuria, complicating childbirth: Principal | ICD-10-CM | POA: Diagnosis present

## 2016-10-29 DIAGNOSIS — O99824 Streptococcus B carrier state complicating childbirth: Secondary | ICD-10-CM | POA: Diagnosis present

## 2016-10-29 DIAGNOSIS — Z8249 Family history of ischemic heart disease and other diseases of the circulatory system: Secondary | ICD-10-CM | POA: Diagnosis not present

## 2016-10-29 DIAGNOSIS — R030 Elevated blood-pressure reading, without diagnosis of hypertension: Secondary | ICD-10-CM | POA: Diagnosis present

## 2016-10-29 DIAGNOSIS — O26893 Other specified pregnancy related conditions, third trimester: Secondary | ICD-10-CM | POA: Diagnosis present

## 2016-10-29 DIAGNOSIS — Z6791 Unspecified blood type, Rh negative: Secondary | ICD-10-CM | POA: Diagnosis not present

## 2016-10-29 DIAGNOSIS — O321XX0 Maternal care for breech presentation, not applicable or unspecified: Secondary | ICD-10-CM | POA: Diagnosis present

## 2016-10-29 DIAGNOSIS — O99344 Other mental disorders complicating childbirth: Secondary | ICD-10-CM | POA: Diagnosis present

## 2016-10-29 DIAGNOSIS — Z3A37 37 weeks gestation of pregnancy: Secondary | ICD-10-CM | POA: Diagnosis not present

## 2016-10-29 DIAGNOSIS — Z98891 History of uterine scar from previous surgery: Secondary | ICD-10-CM

## 2016-10-29 DIAGNOSIS — F418 Other specified anxiety disorders: Secondary | ICD-10-CM | POA: Diagnosis present

## 2016-10-29 HISTORY — DX: History of uterine scar from previous surgery: Z98.891

## 2016-10-29 HISTORY — DX: Maternal care for breech presentation, not applicable or unspecified: O32.1XX0

## 2016-10-29 LAB — COMPREHENSIVE METABOLIC PANEL
ALK PHOS: 172 U/L — AB (ref 38–126)
ALT: 14 U/L (ref 14–54)
AST: 19 U/L (ref 15–41)
Albumin: 2.9 g/dL — ABNORMAL LOW (ref 3.5–5.0)
Anion gap: 10 (ref 5–15)
BUN: 7 mg/dL (ref 6–20)
CALCIUM: 9 mg/dL (ref 8.9–10.3)
CO2: 19 mmol/L — AB (ref 22–32)
CREATININE: 0.49 mg/dL (ref 0.44–1.00)
Chloride: 104 mmol/L (ref 101–111)
Glucose, Bld: 113 mg/dL — ABNORMAL HIGH (ref 65–99)
Potassium: 3.5 mmol/L (ref 3.5–5.1)
Sodium: 133 mmol/L — ABNORMAL LOW (ref 135–145)
Total Bilirubin: 0.4 mg/dL (ref 0.3–1.2)
Total Protein: 6.4 g/dL — ABNORMAL LOW (ref 6.5–8.1)

## 2016-10-29 LAB — CBC
HCT: 31.1 % — ABNORMAL LOW (ref 36.0–46.0)
HEMOGLOBIN: 10.7 g/dL — AB (ref 12.0–15.0)
MCH: 30 pg (ref 26.0–34.0)
MCHC: 34.4 g/dL (ref 30.0–36.0)
MCV: 87.1 fL (ref 78.0–100.0)
Platelets: 300 10*3/uL (ref 150–400)
RBC: 3.57 MIL/uL — AB (ref 3.87–5.11)
RDW: 13.2 % (ref 11.5–15.5)
WBC: 14.5 10*3/uL — AB (ref 4.0–10.5)

## 2016-10-29 LAB — PROTEIN / CREATININE RATIO, URINE
CREATININE, URINE: 101 mg/dL
Protein Creatinine Ratio: 0.11 mg/mg{Cre} (ref 0.00–0.15)
Total Protein, Urine: 11 mg/dL

## 2016-10-29 SURGERY — Surgical Case
Anesthesia: Spinal | Site: Abdomen | Wound class: Clean Contaminated

## 2016-10-29 MED ORDER — LABETALOL HCL 5 MG/ML IV SOLN
20.0000 mg | INTRAVENOUS | Status: DC | PRN
  Administered 2016-10-29: 20 mg via INTRAVENOUS
  Filled 2016-10-29: qty 4
  Filled 2016-10-29: qty 8

## 2016-10-29 MED ORDER — OXYCODONE HCL 5 MG/5ML PO SOLN: 5.0000 mg | Freq: Once | ORAL | Status: DC | PRN

## 2016-10-29 MED ORDER — PHENYLEPHRINE 8 MG IN D5W 100 ML (0.08MG/ML) PREMIX OPTIME
INJECTION | INTRAVENOUS | Status: DC | PRN
  Administered 2016-10-29: 50 ug/min via INTRAVENOUS

## 2016-10-29 MED ORDER — MORPHINE SULFATE (PF) 0.5 MG/ML IJ SOLN
INTRAMUSCULAR | Status: DC | PRN
  Administered 2016-10-29: .2 mg via INTRATHECAL

## 2016-10-29 MED ORDER — SOD CITRATE-CITRIC ACID 500-334 MG/5ML PO SOLN
30.0000 mL | Freq: Once | ORAL | Status: AC
  Administered 2016-10-29: 30 mL via ORAL
  Filled 2016-10-29: qty 15

## 2016-10-29 MED ORDER — ACETAMINOPHEN 500 MG PO TABS
1000.0000 mg | ORAL_TABLET | Freq: Once | ORAL | Status: AC
Start: 2016-10-29 — End: 2016-10-29
  Administered 2016-10-29: 1000 mg via ORAL
  Filled 2016-10-29: qty 2

## 2016-10-29 MED ORDER — PANTOPRAZOLE SODIUM 20 MG PO TBEC
20.0000 mg | DELAYED_RELEASE_TABLET | Freq: Every day | ORAL | Status: DC
  Administered 2016-10-30 – 2016-10-31 (×2): 20 mg via ORAL
  Filled 2016-10-29 (×3): qty 1

## 2016-10-29 MED ORDER — LACTATED RINGERS IV SOLN
INTRAVENOUS | Status: DC
  Administered 2016-10-29: 17:00:00 via INTRAVENOUS

## 2016-10-29 MED ORDER — BUPIVACAINE IN DEXTROSE 0.75-8.25 % IT SOLN
INTRATHECAL | Status: DC | PRN
  Administered 2016-10-29: 1.6 mL via INTRATHECAL

## 2016-10-29 MED ORDER — ONDANSETRON HCL 4 MG/2ML IJ SOLN
INTRAMUSCULAR | Status: DC | PRN
  Administered 2016-10-29: 4 mg via INTRAVENOUS

## 2016-10-29 MED ORDER — FAMOTIDINE IN NACL 20-0.9 MG/50ML-% IV SOLN
20.0000 mg | Freq: Once | INTRAVENOUS | Status: AC
  Administered 2016-10-29: 20 mg via INTRAVENOUS
  Filled 2016-10-29: qty 50

## 2016-10-29 MED ORDER — FENTANYL CITRATE (PF) 100 MCG/2ML IJ SOLN
INTRAMUSCULAR | Status: AC
  Filled 2016-10-29: qty 2

## 2016-10-29 MED ORDER — ONDANSETRON HCL 4 MG/2ML IJ SOLN: 4.0000 mg | Freq: Four times a day (QID) | INTRAMUSCULAR | Status: DC | PRN

## 2016-10-29 MED ORDER — MEPERIDINE HCL 25 MG/ML IJ SOLN: 6.2500 mg | INTRAMUSCULAR | Status: DC | PRN

## 2016-10-29 MED ORDER — PROMETHAZINE HCL 25 MG/ML IJ SOLN
6.2500 mg | Freq: Four times a day (QID) | INTRAMUSCULAR | Status: DC | PRN
  Administered 2016-10-29: 6.25 mg via INTRAVENOUS

## 2016-10-29 MED ORDER — SERTRALINE HCL 50 MG PO TABS
50.0000 mg | ORAL_TABLET | Freq: Every day | ORAL | Status: DC
  Administered 2016-10-30 – 2016-10-31 (×2): 50 mg via ORAL
  Filled 2016-10-29 (×3): qty 1

## 2016-10-29 MED ORDER — KETOROLAC TROMETHAMINE 30 MG/ML IJ SOLN
INTRAMUSCULAR | Status: AC
  Administered 2016-10-29: 30 mg via INTRAMUSCULAR
  Filled 2016-10-29: qty 1

## 2016-10-29 MED ORDER — SCOPOLAMINE 1 MG/3DAYS TD PT72
MEDICATED_PATCH | TRANSDERMAL | Status: DC | PRN
Start: 2016-10-29 — End: 2016-10-29
  Administered 2016-10-29: 1 via TRANSDERMAL

## 2016-10-29 MED ORDER — DEXAMETHASONE SODIUM PHOSPHATE 4 MG/ML IJ SOLN
INTRAMUSCULAR | Status: DC | PRN
  Administered 2016-10-29: 4 mg via INTRAVENOUS

## 2016-10-29 MED ORDER — OXYCODONE HCL 5 MG PO TABS: 5.0000 mg | ORAL_TABLET | Freq: Once | ORAL | Status: DC | PRN

## 2016-10-29 MED ORDER — MORPHINE SULFATE (PF) 0.5 MG/ML IJ SOLN
INTRAMUSCULAR | Status: AC
  Filled 2016-10-29: qty 10

## 2016-10-29 MED ORDER — KETOROLAC TROMETHAMINE 30 MG/ML IJ SOLN
30.0000 mg | Freq: Four times a day (QID) | INTRAMUSCULAR | Status: AC | PRN
  Administered 2016-10-29: 30 mg via INTRAMUSCULAR

## 2016-10-29 MED ORDER — CEFAZOLIN SODIUM-DEXTROSE 2-4 GM/100ML-% IV SOLN
2.0000 g | INTRAVENOUS | Status: AC
  Administered 2016-10-29: 2 g via INTRAVENOUS
  Filled 2016-10-29: qty 100

## 2016-10-29 MED ORDER — FENTANYL CITRATE (PF) 100 MCG/2ML IJ SOLN
INTRAMUSCULAR | Status: DC | PRN
  Administered 2016-10-29: 10 ug via INTRATHECAL

## 2016-10-29 MED ORDER — FENTANYL CITRATE (PF) 100 MCG/2ML IJ SOLN
25.0000 ug | INTRAMUSCULAR | Status: DC | PRN
  Administered 2016-10-29: 50 ug via INTRAVENOUS

## 2016-10-29 MED ORDER — KETOROLAC TROMETHAMINE 30 MG/ML IJ SOLN: 30.0000 mg | Freq: Four times a day (QID) | INTRAMUSCULAR | Status: AC | PRN

## 2016-10-29 MED ORDER — OXYTOCIN 10 UNIT/ML IJ SOLN
INTRAVENOUS | Status: DC | PRN
  Administered 2016-10-29: 40 [IU] via INTRAVENOUS

## 2016-10-29 MED ORDER — LACTATED RINGERS IV SOLN
INTRAVENOUS | Status: DC | PRN
  Administered 2016-10-29 (×2): via INTRAVENOUS

## 2016-10-29 MED ORDER — PROMETHAZINE HCL 25 MG/ML IJ SOLN
INTRAMUSCULAR | Status: AC
  Administered 2016-10-29: 6.25 mg via INTRAVENOUS
  Filled 2016-10-29: qty 1

## 2016-10-29 MED ORDER — HYDRALAZINE HCL 20 MG/ML IJ SOLN: 10.0000 mg | Freq: Once | INTRAMUSCULAR | Status: DC | PRN

## 2016-10-29 MED ORDER — SODIUM CHLORIDE 0.9 % IR SOLN
Status: DC | PRN
  Administered 2016-10-29: 1

## 2016-10-29 MED ORDER — SCOPOLAMINE 1 MG/3DAYS TD PT72
MEDICATED_PATCH | TRANSDERMAL | Status: AC
  Filled 2016-10-29: qty 1

## 2016-10-29 SURGICAL SUPPLY — 33 items
BENZOIN TINCTURE PRP APPL 2/3 (GAUZE/BANDAGES/DRESSINGS) ×2 IMPLANT
CHLORAPREP W/TINT 26ML (MISCELLANEOUS) ×2 IMPLANT
CLAMP CORD UMBIL (MISCELLANEOUS) IMPLANT
CLOTH BEACON ORANGE TIMEOUT ST (SAFETY) ×2 IMPLANT
CONTAINER PREFILL 10% NBF 15ML (MISCELLANEOUS) IMPLANT
DRSG OPSITE POSTOP 4X10 (GAUZE/BANDAGES/DRESSINGS) ×2 IMPLANT
ELECT REM PT RETURN 9FT ADLT (ELECTROSURGICAL) ×2
ELECTRODE REM PT RTRN 9FT ADLT (ELECTROSURGICAL) ×1 IMPLANT
EXTRACTOR VACUUM M CUP 4 TUBE (SUCTIONS) IMPLANT
GLOVE BIO SURGEON STRL SZ 6.5 (GLOVE) ×2 IMPLANT
GLOVE BIOGEL PI IND STRL 7.0 (GLOVE) ×1 IMPLANT
GLOVE BIOGEL PI INDICATOR 7.0 (GLOVE) ×1
GOWN STRL REUS W/TWL LRG LVL3 (GOWN DISPOSABLE) ×4 IMPLANT
KIT ABG SYR 3ML LUER SLIP (SYRINGE) IMPLANT
NEEDLE HYPO 25X5/8 SAFETYGLIDE (NEEDLE) IMPLANT
NS IRRIG 1000ML POUR BTL (IV SOLUTION) ×2 IMPLANT
PACK C SECTION WH (CUSTOM PROCEDURE TRAY) ×2 IMPLANT
PAD OB MATERNITY 4.3X12.25 (PERSONAL CARE ITEMS) ×2 IMPLANT
PENCIL SMOKE EVAC W/HOLSTER (ELECTROSURGICAL) ×2 IMPLANT
RTRCTR C-SECT PINK 25CM LRG (MISCELLANEOUS) ×2 IMPLANT
STRIP CLOSURE SKIN 1/2X4 (GAUZE/BANDAGES/DRESSINGS) ×2 IMPLANT
SUT MNCRL 0 VIOLET CTX 36 (SUTURE) ×2 IMPLANT
SUT MONOCRYL 0 CTX 36 (SUTURE) ×2
SUT PLAIN 1 NONE 54 (SUTURE) IMPLANT
SUT PLAIN 2 0 XLH (SUTURE) ×2 IMPLANT
SUT VIC AB 0 CT1 27 (SUTURE) ×2
SUT VIC AB 0 CT1 27XBRD ANBCTR (SUTURE) ×2 IMPLANT
SUT VIC AB 2-0 CT1 27 (SUTURE) ×1
SUT VIC AB 2-0 CT1 TAPERPNT 27 (SUTURE) ×1 IMPLANT
SUT VIC AB 4-0 KS 27 (SUTURE) ×2 IMPLANT
SYR BULB IRRIGATION 50ML (SYRINGE) ×2 IMPLANT
TOWEL OR 17X24 6PK STRL BLUE (TOWEL DISPOSABLE) ×2 IMPLANT
TRAY FOLEY CATH SILVER 14FR (SET/KITS/TRAYS/PACK) ×2 IMPLANT

## 2016-10-29 NOTE — Transfer of Care (Signed)
Immediate Anesthesia Transfer of Care Note  Patient: Morgan Chang  Procedure(s) Performed: Procedure(s): CESAREAN SECTION (N/A)  Patient Location: PACU  Anesthesia Type:Spinal  Level of Consciousness: awake, alert  and oriented  Airway & Oxygen Therapy: Patient Spontanous Breathing  Post-op Assessment: Report given to RN and Post -op Vital signs reviewed and stable  Post vital signs: Reviewed and stable  Last Vitals:  Vitals:   10/29/16 1846 10/29/16 1901  BP: 137/94 126/84  Pulse: 98 86  Resp:    Temp:      Last Pain:  Vitals:   10/29/16 2021  TempSrc:   PainSc: 6          Complications: No apparent anesthesia complications

## 2016-10-29 NOTE — Anesthesia Procedure Notes (Signed)
Spinal  Patient location during procedure: OR Start time: 10/29/2016 8:51 PM End time: 10/29/2016 8:54 PM Staffing Anesthesiologist: Marcie Bal, Samatha Anspach Performed: anesthesiologist  Preanesthetic Checklist Completed: patient identified, surgical consent, pre-op evaluation, timeout performed, IV checked, risks and benefits discussed and monitors and equipment checked Spinal Block Patient position: sitting Prep: DuraPrep Patient monitoring: cardiac monitor, continuous pulse ox and blood pressure Approach: midline Location: L3-4 Injection technique: single-shot Needle Needle type: Pencan  Needle gauge: 24 G Needle length: 9 cm Assessment Sensory level: T10 Additional Notes Functioning IV was confirmed and monitors were applied. Sterile prep and drape, including hand hygiene and sterile gloves were used. The patient was positioned and the spine was prepped. The skin was anesthetized with lidocaine.  Free flow of clear CSF was obtained prior to injecting local anesthetic into the CSF.  The spinal needle aspirated freely following injection.  The needle was carefully withdrawn.  The patient tolerated the procedure well.

## 2016-10-29 NOTE — Progress Notes (Signed)
Patient ID: Morgan Chang, female   DOB: [birth year], 21 y.o.   MRN: [id number]   Pt desires Circumcision for female infant, d/w pt and husband r/b/a - wish to proceed Paid at office for hospital

## 2016-10-29 NOTE — Progress Notes (Signed)
Dr Melba Coon notified of pt presenting complaints contractions.  Notified of increased bp, headache and weight gain + 6 lbs in 3 days. Orders for CBC, CMP and urine protein/creatine ratio.

## 2016-10-29 NOTE — Brief Op Note (Signed)
10/29/2016  10:07 PM  PATIENT:  Morgan Chang  21 y.o. female  PRE-OPERATIVE DIAGNOSIS:  Primary Cesarean Section, increased BP, Breech presentation  POST-OPERATIVE DIAGNOSIS:  Primary Cesarean Section, increased BP, Breech presentatio  PROCEDURE:  Procedure(s): CESAREAN SECTION (N/A)  SURGEON:  Surgeon(s) and Role:    * Janyth Contes, MD - Primary  ASSISTANTS: Engineer, civil (consulting) RNFA   FINDINGS: viable female infant at 21:14, apgars 7/8, wt P; nl PP uterus, tubes and ovaries  ANESTHESIA:   spinal  EBL:  Total I/O In: 2400 [I.V.:2400] Out: 950 [Urine:200; Blood:750]  BLOOD ADMINISTERED:none  DRAINS: Urinary Catheter (Foley)   LOCAL MEDICATIONS USED:  NONE  SPECIMEN:  Source of Specimen:  Placenta  DISPOSITION OF SPECIMEN:  L&D  COUNTS:  YES  TOURNIQUET:  * No tourniquets in log *  DICTATION: .Other Dictation: Dictation Number [id number]  PLAN OF CARE: Admit to inpatient   PATIENT DISPOSITION:  PACU - hemodynamically stable.   Delay start of Pharmacological VTE agent (>24hrs) due to surgical blood loss or risk of bleeding: not applicable

## 2016-10-29 NOTE — MAU Note (Signed)
Contraction for about an hour that have started to get closer together.  Deneis LOF, vaginal bleeding,  Baby is breech

## 2016-10-29 NOTE — Anesthesia Preprocedure Evaluation (Signed)
Anesthesia Evaluation  Patient identified by MRN, date of birth, ID band Patient awake    Reviewed: Allergy & Precautions, H&P , NPO status , Patient's Chart, lab work & pertinent test results  Airway Mallampati: II   Neck ROM: full    Dental   Pulmonary neg pulmonary ROS,    breath sounds clear to auscultation       Cardiovascular negative cardio ROS   Rhythm:regular Rate:Normal     Neuro/Psych  Headaches, Anxiety Depression    GI/Hepatic   Endo/Other    Renal/GU      Musculoskeletal   Abdominal   Peds  Hematology   Anesthesia Other Findings   Reproductive/Obstetrics                             Anesthesia Physical Anesthesia Plan  ASA: II  Anesthesia Plan: Spinal   Post-op Pain Management:    Induction: Intravenous  Airway Management Planned: Simple Face Mask  Additional Equipment:   Intra-op Plan:   Post-operative Plan:   Informed Consent: I have reviewed the patients History and Physical, chart, labs and discussed the procedure including the risks, benefits and alternatives for the proposed anesthesia with the patient or authorized representative who has indicated his/her understanding and acceptance.     Plan Discussed with: CRNA, Anesthesiologist and Surgeon  Anesthesia Plan Comments:         Anesthesia Quick Evaluation

## 2016-10-29 NOTE — H&P (Addendum)
Morgan Chang is a 21 y.o. female G3P0020 at 37+ with HA, malaise and elevated BP.  Has been normal with normal 24hr urine, but now 137-154/ 92-118.  Received labetalol in MAU - on protocol.  Pt with depression/anxiety.  Also Rh neg - has received Rhogam.  Had some leg swelling and normal LE dopplers.  Otherwise relatively normal prenatal care.  Failed version will confirm breech.  D/W pt r/b/a of LTCS.  OB History    Gravida Para Term Preterm AB Living   3       2     SAB TAB Ectopic Multiple Live Births   2            no abn pap, last 01/2016 WNL No STDs SAB x 2 G3 present  Past Medical History:  Diagnosis Date  . Anxiety   . Depression   . Migraines   eczema  Past Surgical History:  Procedure Laterality Date  . ADENOIDECTOMY  10/29/2007  . BREAST REDUCTION SURGERY    . LAPAROSCOPY  12/10/2014  . NISSEN FUNDOPLICATION    . RHINOPLASTY  03/22/2010   deviated septum  . TONSILLECTOMY  2017   Family History: family history includes Hypertension in her father and mother. CAD in mo Social History:  reports that she has never smoked. She has never used smokeless tobacco. She reports that she does not drink alcohol or use drugs. married.  Meds PNV, Zoloft All NKDA     Maternal Diabetes: No Genetic Screening: Normal Maternal Ultrasounds/Referrals: Normal Fetal Ultrasounds or other Referrals:  None Maternal Substance Abuse:  No Significant Maternal Medications:  Meds include: Zoloft Significant Maternal Lab Results:  Lab values include: Group B Strep positive, Rh negative Other Comments:  CF, SMA, Fragile X neg  Review of Systems  Constitutional: Positive for malaise/fatigue.  HENT: Negative.   Eyes: Positive for blurred vision.  Respiratory: Negative.   Cardiovascular: Negative.   Gastrointestinal: Negative.   Genitourinary: Negative.   Musculoskeletal: Negative.   Skin: Negative.   Neurological: Positive for headaches.  Psychiatric/Behavioral: Negative.     Maternal Medical History:  Contractions: Frequency: irregular.   Perceived severity is moderate.    Fetal activity: Perceived fetal activity is normal.   Last perceived fetal movement was within the past hour.    Prenatal Complications - Diabetes: none.      Blood pressure (!) 148/118, pulse 96, temperature 97.9 F (36.6 C), temperature source Oral, resp. rate 18, height 5\' 6"  (1.676 m), weight 98 kg (216 lb 0.6 oz), last menstrual period 02/10/2016. Maternal Exam:  Uterine Assessment: Contraction strength is moderate.  Abdomen: Patient reports no abdominal tenderness. Fundal height is appropriate for gestation.   Estimated fetal weight is 7-8#.   Fetal presentation: breech  Introitus: Normal vulva. Normal vagina.    Physical Exam  Constitutional: She is oriented to person, place, and time. She appears well-developed and well-nourished.  Some anxiety, doesn't feel well  HENT:  Head: Normocephalic and atraumatic.  Cardiovascular: Normal rate and regular rhythm.   Respiratory: Effort normal and breath sounds normal. No respiratory distress. She has no wheezes.  GI: Soft. Bowel sounds are normal. She exhibits no distension. There is no tenderness.  Musculoskeletal: Normal range of motion.  Neurological: She is alert and oriented to person, place, and time.  Skin: Skin is warm and dry.  Psychiatric: She has a normal mood and affect. Her behavior is normal.    Prenatal labs: ABO, Rh: O/Negative/-- (08/11 0000) Antibody: Positive (08/11  0000) Rubella:  immune RPR: Nonreactive (08/11 0000)  HBsAg: Negative (08/11 0000)  HIV: Non-reactive (08/11 0000)  GBS: Positive (10/10 0000)   Ab Scr was + - too low to titer, repeat neg; Hgb 13.2/Plt 333/Ur Cx + gbbs/GC neg/Chl neg/essential panel neg/nl NT/First tri screen WNL, glucola 94  Neg LE doppler  Nl anat, post plac, female - f/u completes anat and nl growth  Rhogam, Flu, Tdap in [facility]  Assessment/Plan: 20yo G3P0020 at 37+ with  elevated BP with severe features and breech presentation Failed version, BS [place] confirms breech D/w pt r/b/a of LTCS Pt on labetalol protocol  Will follow current case in OR  Bovard-Stuckert, Dreshawn Hendershott 10/29/2016, 4:12 PM

## 2016-10-30 ENCOUNTER — Encounter (HOSPITAL_COMMUNITY): Payer: Self-pay | Admitting: Obstetrics and Gynecology

## 2016-10-30 LAB — CBC
HCT: 27.8 % — ABNORMAL LOW (ref 36.0–46.0)
Hemoglobin: 9.6 g/dL — ABNORMAL LOW (ref 12.0–15.0)
MCH: 30.4 pg (ref 26.0–34.0)
MCHC: 34.5 g/dL (ref 30.0–36.0)
MCV: 88 fL (ref 78.0–100.0)
PLATELETS: 251 10*3/uL (ref 150–400)
RBC: 3.16 MIL/uL — AB (ref 3.87–5.11)
RDW: 13.1 % (ref 11.5–15.5)
WBC: 20.4 10*3/uL — AB (ref 4.0–10.5)

## 2016-10-30 MED ORDER — PRENATAL MULTIVITAMIN CH
1.0000 | ORAL_TABLET | Freq: Every day | ORAL | Status: DC
  Administered 2016-10-30 – 2016-10-31 (×2): 1 via ORAL
  Filled 2016-10-30 (×2): qty 1

## 2016-10-30 MED ORDER — DIBUCAINE 1 % RE OINT: 1.0000 "application " | TOPICAL_OINTMENT | RECTAL | Status: DC | PRN

## 2016-10-30 MED ORDER — ACETAMINOPHEN 325 MG PO TABS
650.0000 mg | ORAL_TABLET | ORAL | Status: DC | PRN
  Administered 2016-10-31 (×3): 650 mg via ORAL
  Filled 2016-10-30 (×3): qty 2

## 2016-10-30 MED ORDER — MENTHOL 3 MG MT LOZG: 1.0000 | LOZENGE | OROMUCOSAL | Status: DC | PRN

## 2016-10-30 MED ORDER — SIMETHICONE 80 MG PO CHEW
80.0000 mg | CHEWABLE_TABLET | ORAL | Status: DC
  Administered 2016-10-30 – 2016-11-01 (×2): 80 mg via ORAL
  Filled 2016-10-30 (×2): qty 1

## 2016-10-30 MED ORDER — OXYCODONE HCL 5 MG PO TABS
10.0000 mg | ORAL_TABLET | ORAL | Status: DC | PRN
  Administered 2016-10-31 – 2016-11-01 (×3): 10 mg via ORAL
  Filled 2016-10-30 (×4): qty 2

## 2016-10-30 MED ORDER — OXYCODONE HCL 5 MG PO TABS
5.0000 mg | ORAL_TABLET | ORAL | Status: DC | PRN
  Administered 2016-10-30 – 2016-11-01 (×8): 5 mg via ORAL
  Filled 2016-10-30 (×7): qty 1

## 2016-10-30 MED ORDER — OXYTOCIN 40 UNITS IN LACTATED RINGERS INFUSION - SIMPLE MED: 2.5000 [IU]/h | INTRAVENOUS | Status: AC

## 2016-10-30 MED ORDER — COCONUT OIL OIL: 1.0000 "application " | TOPICAL_OIL | Status: DC | PRN

## 2016-10-30 MED ORDER — DIPHENHYDRAMINE HCL 25 MG PO CAPS
25.0000 mg | ORAL_CAPSULE | Freq: Four times a day (QID) | ORAL | Status: DC | PRN
  Administered 2016-10-30: 25 mg via ORAL
  Filled 2016-10-30: qty 1

## 2016-10-30 MED ORDER — IBUPROFEN 800 MG PO TABS
800.0000 mg | ORAL_TABLET | Freq: Three times a day (TID) | ORAL | Status: DC
  Administered 2016-10-30 – 2016-11-01 (×7): 800 mg via ORAL
  Filled 2016-10-30 (×7): qty 1

## 2016-10-30 MED ORDER — ZOLPIDEM TARTRATE 5 MG PO TABS: 5.0000 mg | ORAL_TABLET | Freq: Every evening | ORAL | Status: DC | PRN

## 2016-10-30 MED ORDER — SIMETHICONE 80 MG PO CHEW: 80.0000 mg | CHEWABLE_TABLET | ORAL | Status: DC | PRN

## 2016-10-30 MED ORDER — WITCH HAZEL-GLYCERIN EX PADS: 1.0000 "application " | MEDICATED_PAD | CUTANEOUS | Status: DC | PRN

## 2016-10-30 MED ORDER — SENNOSIDES-DOCUSATE SODIUM 8.6-50 MG PO TABS
2.0000 | ORAL_TABLET | ORAL | Status: DC
  Administered 2016-10-30 – 2016-11-01 (×2): 2 via ORAL
  Filled 2016-10-30 (×2): qty 2

## 2016-10-30 MED ORDER — SIMETHICONE 80 MG PO CHEW
80.0000 mg | CHEWABLE_TABLET | Freq: Three times a day (TID) | ORAL | Status: DC
  Administered 2016-10-30 – 2016-11-01 (×7): 80 mg via ORAL
  Filled 2016-10-30 (×7): qty 1

## 2016-10-30 MED ORDER — LACTATED RINGERS IV SOLN
INTRAVENOUS | Status: DC
  Administered 2016-10-30: 01:00:00 via INTRAVENOUS

## 2016-10-30 NOTE — Anesthesia Postprocedure Evaluation (Addendum)
Anesthesia Post Note  Patient: Morgan Chang  Procedure(s) Performed: Procedure(s) (LRB): CESAREAN SECTION (N/A)  Patient location during evaluation: Mother Baby Anesthesia Type: Spinal Level of consciousness: awake and alert and oriented Pain management: satisfactory to patient Vital Signs Assessment: post-procedure vital signs reviewed and stable Respiratory status: spontaneous breathing and nonlabored ventilation Cardiovascular status: stable Postop Assessment: no headache, no backache, patient able to bend at knees, no signs of nausea or vomiting and adequate PO intake Anesthetic complications: no        Last Vitals:  Vitals:   10/30/16 0745 10/30/16 1150  BP: 117/78 114/61  Pulse: 89 90  Resp: 20 20  Temp: 36.9 C     Last Pain:  Vitals:   10/30/16 1150  TempSrc:   PainSc: 5    Pain Goal:                 GREGORY,SUZANNE

## 2016-10-30 NOTE — Lactation Note (Signed)
This note was copied from a baby's chart. Lactation Consultation Note  Patient Name: Morgan Chang [id number] Date: 10/30/2016 Reason for consult: Initial assessment Baby at 58 hr of life. Mom states she wants to ebf for the first 6wk then offer breast and formula. She had a breast reduction 08/2015. The entire nipple was removed but she reports she has complete feeling. She is worried that she is not making enough milk and that baby is not latching well so she has offered formula bottles. Discussed baby behavior, feeding frequency, artifical nipples, supplementing, pumping, baby belly size, voids, wt loss, breast changes, and nipple care. Demonstrated manual expression, colostrum noted bilaterally. Given extra spoons and curved tip syringe. Set up her personal Medela PIS per her request. She would rather use her pump than the hospital one. Given lactation handouts. Aware of OP services and support group. She will call at the next feeding for latch help. Lactation phone number on the white board.      Maternal Data Has patient been taught Hand Expression?: Yes Does the patient have breastfeeding experience prior to this delivery?: No  Feeding Feeding Type: Bottle Fed - Formula  LATCH Score/Interventions                      Lactation Tools Discussed/Used WIC Program: No Pump Review: Setup, frequency, and cleaning;Milk Storage;Other (comment) (pump settings) Initiated by:: ES Date initiated:: 10/30/16   Consult Status Consult Status: Follow-up Date: 10/31/16 Follow-up type: In-patient    Denzil Hughes 10/30/2016, 3:27 PM

## 2016-10-30 NOTE — Lactation Note (Signed)
This note was copied from a baby's chart. Lactation Consultation Note  Patient Name: Morgan Chang [id number] Date: 10/30/2016 Reason for consult: Follow-up assessment Baby at 23 hr of life. Assisted RN with NS sizing and use. Mom was fitted for #24. Parents were taught proper NS placement, care, and how to supplement with formula in the NS.   Maternal Data    Feeding Feeding Type: Breast Milk with Formula added Nipple Type: Slow - flow Length of feed:  (Infant not maintaining latch)  LATCH Score/Interventions                      Lactation Tools Discussed/Used Tools: Nipple Shields Nipple shield size: 24   Consult Status      Denzil Hughes 10/30/2016, 8:17 PM

## 2016-10-30 NOTE — Clinical Social Work Maternal (Signed)
CLINICAL SOCIAL WORK MATERNAL/CHILD NOTE  Patient Details  Name: Morgan Chang MRN: [id number] Date of Birth: 10/17/1995  Date:  10/30/2016  Clinical Social Worker Initiating Note:   , MSW, LCSW-A   Date/ Time Initiated:  10/30/16/1028              Child's Name:  Harrison Palmeri    Legal Guardian:  Other (Comment) (Not established by court system; MOB and FOB ([name] Hoogland) parent collectively in primary household composition)   Need for Interpreter:  None   Date of Referral:  10/29/16     Reason for Referral:  Other (Comment) (MOB hx of anxiety/ depression )   Referral Source:  RN   Address:  [address] [place] [address]  Phone number:  [phone]   Household Members: Self, Spouse   Natural Supports (not living in the home): Immediate Family, Friends, Extended Family, Parent   Professional Supports:None   Employment:    Type of Work:     Education:      Financial Resources:    Other Resources:     Cultural/Religious Considerations Which May Impact Care: None reported.   Strengths: Ability to meet basic needs , Compliance with medical plan , Home prepared for child , Pediatrician chosen    Risk Factors/Current Problems: Other (Comment) (Anxiety and depression )   Cognitive State: Able to Concentrate , Alert , Insightful    Mood/Affect: Calm , Comfortable , Interested    CSW Assessment:CSW met with MOB at bedside to complete assessment for consult regarding anxiety/depression. Upon CSW's arrival, MOB was in bed watching RN give baby his first bath with FOB. With MOB's permission, this writer explained role and reasoning for visit. MOB was pleasant and welcoming to this writer. MOB noted she has had this dx for several years and It is managed with Zoloft. MOB was able to identify supports in the event symptoms become unmanageable. CSW educated MOB on PPD and safe sleeping/SIDS. MOB verbalized  understanding. At this time, MOB and FOB deny any further needs. CSW has no barriers to d/c thus, case closed to this CSW.   CSW Plan/Description: Patient/Family Education , No Further Intervention Required/No Barriers to Discharge     , MSW, LCSW-A Clinical Social Worker   [facility]  Office: [phone]    CLINICAL SOCIAL WORK MATERNAL/CHILD NOTE  Patient Details  Name: Morgan Chang MRN: [id number] Date of Birth: 24-Apr-1996  Date:  10/30/2016  Clinical Social Worker Initiating Note:  Ferdinand Lango Mardell Cragg, MSW, LCSW-A  Date/ Time Initiated:  10/30/16/1028     Child's Name:  Waco Blas    Legal Guardian:  Other (Comment) (Not established by court system; MOB and FOB ([name] [place]) parent collectively in primary household composition)   Need for Interpreter:  None   Date of Referral:  10/29/16     Reason for Referral:  Other (Comment) (MOB hx of anxiety/ depression )   Referral Source:  RN   Address:  [address] [place] [id number]  Phone number:  [phone]   Household Members:  Self, Spouse   Natural Supports (not living in the home):  Immediate Family, Friends, Extended Family, Artist Supports: None   Employment:     Type of Work:     Education:      Pensions consultant:      Other Resources:      Cultural/Religious Considerations Which May Impact Care:  None reported.   Strengths:  Ability to meet basic needs , Compliance with medical plan , Home prepared for child , Pediatrician chosen    Risk Factors/Current Problems:  Other (Comment) (Anxiety and depression )   Cognitive State:  Able to Concentrate , Alert , Insightful    Mood/Affect:  Calm , Comfortable , Interested    CSW Assessment: CSW met with MOB at bedside to complete assessment for consult regarding anxiety/depression. Upon CSW's arrival, MOB was in bed watching RN give baby his first bath with FOB. With MOB's permission, this writer explained role and reasoning for visit. MOB was pleasant and welcoming to this Probation officer. MOB noted she has had this dx for several years and It is managed with Zoloft. MOB was able to identify supports in the event symptoms become unmanageable. CSW educated MOB on PPD and safe sleeping/SIDS. MOB verbalized understanding. At this time, MOB and  FOB deny any further needs. CSW has no barriers to d/c thus, case closed to this CSW.   CSW Plan/Description:  Engineer, mining , No Further Intervention Required/No Barriers to Discharge    Oda Cogan, MSW, [facility]  Office: [phone]

## 2016-10-30 NOTE — Progress Notes (Signed)
Subjective: Postpartum Day 1: Cesarean Delivery Patient reports incisional pain and tolerating PO.  Nl lochia, pain controlled  Objective: Vital signs in last 24 hours: Temp:  [97.8 F (36.6 C)-98.7 F (37.1 C)] 98.2 F (36.8 C) (02/11 0349) Pulse Rate:  [86-125] 108 (02/11 0349) Resp:  [12-40] 16 (02/11 0349) BP: (113-154)/(60-118) 113/60 (02/11 0349) SpO2:  [97 %-100 %] 98 % (02/11 0349) Weight:  [98 kg (216 lb 0.6 oz)] 98 kg (216 lb 0.6 oz) (02/10 1435)  Physical Exam:  General: alert and no distress Lochia: appropriate Uterine Fundus: firm Incision: healing well DVT Evaluation: No evidence of DVT seen on physical exam.   Recent Labs  10/29/16 1455 10/30/16 0528  HGB 10.7* 9.6*  HCT 31.1* 27.8*    Assessment/Plan: Status post Cesarean section. Doing well postoperatively.  Continue current care.  Chang, Morgan Lenart 10/30/2016, 5:57 AM

## 2016-10-31 MED ORDER — RHO D IMMUNE GLOBULIN 1500 UNIT/2ML IJ SOSY
300.0000 ug | PREFILLED_SYRINGE | Freq: Once | INTRAMUSCULAR | Status: AC
Start: 2016-10-31 — End: 2016-10-31
  Administered 2016-10-31: 300 ug via INTRAMUSCULAR
  Filled 2016-10-31: qty 2

## 2016-10-31 NOTE — Plan of Care (Signed)
Problem: Nutritional: Goal: Mothers verbalization of comfort with breastfeeding process will improve Outcome: Not Applicable Date Met: 94/83/47 Mother made a decision last night that she would prefer to bottle feed formula to the infant. She is no longer putting infant to the breast nor pumping.

## 2016-10-31 NOTE — Progress Notes (Signed)
Patient ID: Morgan Chang, female   DOB: August 13, 1996, 21 y.o.   MRN: [id number] POD#2 Pt doing well but fatigued. Denies any fever, chills or headaches. She is bonding well with baby - has decided to bottle feed ( hx breast reduction affecting supply). Ambulating with no issues. Having some cramps at this time - due for pain meds.  VSS 114-126/61-79 ABD- FF at 1cm below umbilicus EXT - mild edema b/l but no Homans  A/P: POD#2 s/p LTC/S for breech - stable         Routine pp/post op care         Circ later today or tomorrow ( baby under bili lights)         Likely discharge to home tomorrow

## 2016-10-31 NOTE — Lactation Note (Signed)
This note was copied from a baby's chart. Lactation Consultation Note: Staff nurse informed [place]  that Mother is no longer breastfeeding. She is only giving formula.   Patient Name: Morgan Chang [id number] Date: 10/31/2016     Maternal Data    Feeding    LATCH Score/Interventions                      Lactation Tools Discussed/Used     Consult Status      Darla Lesches 10/31/2016, 2:04 PM

## 2016-10-31 NOTE — Op Note (Signed)
NAME:  Morgan Chang, Morgan Chang NO.:  [id number]  MEDICAL RECORD NO.:  [id number]  LOCATION:                                 FACILITY:  PHYSICIAN:  Thornell Sartorius, MD        DATE OF BIRTH:  [birth year]  DATE OF PROCEDURE:  10/29/2016 DATE OF DISCHARGE:                              OPERATIVE REPORT   PREOPERATIVE DIAGNOSES:  Intrauterine pregnancy at 37 weeks with increased blood pressure and severe symptoms, breech presentation.  POSTOPERATIVE DIAGNOSES:  Intrauterine pregnancy at 6 weeks with increased blood pressure and severe symptoms, breech presentation, status post low transverse cesarean section.  PROCEDURE:  Primary low transverse cesarean section.  SURGEON:  Thornell Sartorius, MD.  ASSISTANTHorald Chestnut, RNFA.  FINDINGS:  Viable female infant at 2114 with Apgars of 7 at 1 minute, 8 at 5 minutes and weight pending at the time of dictation.  Normal postpartum uterus, tubes, and ovaries are noted.  ANESTHESIA:  Spinal.  IV FLUIDS:  2400.  URINE OUTPUT:  200 mL of clear urine at the end of procedure.  APPROXIMATE ESTIMATED BLOOD LOSS:  750 mL.  COMPLICATIONS:  None.  PATHOLOGY:  Placenta to Labor and Delivery.  DESCRIPTION OF PROCEDURE:  After informed consent was reviewed with the patient and her family, she was transported to the OR where spinal anesthesia was placed and found to be adequate.  Once she was returned to the supine position with a leftward tilt, she was then prepped and draped in a normal sterile fashion.  A Foley catheter was sterilely placed.  A Pfannenstiel skin incision was made at the level 2 fingerbreadths above the pubic symphysis, carried through to the underlying layer of fascia sharply.  The fascia was incised in the midline.  The incision was extended laterally with Mayo scissors. Superior aspect of the fascial incision was grasped with Kocher clamps, elevated and the rectus muscles were dissected off both bluntly  and sharply.  Midline was easily identified and the peritoneum was entered bluntly.  The incision was extended superiorly and inferiorly with good visualization of the bladder.  An Alexis skin retractor was placed carefully making sure that no bowel was entrapped.  The uterus was incised in a transverse fashion.  The infant was delivered from a frank breech presentation in the typical fashion.  The nose and mouth were suctioned on the field.  The cord was clamped and cut after waiting a minute.  The placenta was expressed from the uterus.  The uterus was cleared of all clot and debris and closed in 2 layers of 0 Monocryl, the first of which was running locked and second as an imbricating layer. The uterus was noted to be hemostatic.  The anatomy was inspected and the gutters were cleared of all clot and debris.  The peritoneum was closed with 2-0 Vicryl in a running fashion.  Subfascial planes were inspected and found to be hemostatic.  The fascia was closed with 0 Vicryl from either corner overlapping in the midline.  The subcuticular adipose layer was made hemostatic with Bovie cautery.  The dead space was closed with 3-0 plain gut.  Skin was closed with 4-0  Monocryl on a Keith needle.  Benzoin and Steri-Strips were applied.  The patient tolerated the procedure well.  Sponge, lap, and needle counts were correct x2 per the operating staff.     Thornell Sartorius, MD   ______________________________ Thornell Sartorius, MD    JB/MEDQ  D:  10/29/2016  T:  10/30/2016  Job:  [id number]

## 2016-11-01 LAB — RH IG WORKUP (INCLUDES ABO/RH)
ABO/RH(D): O NEG
Antibody Screen: POSITIVE
DAT, IgG: NEGATIVE
Fetal Screen: NEGATIVE
Gestational Age(Wks): 37.3
Unit division: 0

## 2016-11-01 LAB — RPR: RPR: NONREACTIVE

## 2016-11-01 MED ORDER — IBUPROFEN 800 MG PO TABS: 800.0000 mg | ORAL_TABLET | Freq: Three times a day (TID) | ORAL | 0 refills | Status: DC

## 2016-11-01 MED ORDER — OXYCODONE HCL 10 MG PO TABS: 10.0000 mg | ORAL_TABLET | ORAL | 0 refills | Status: DC | PRN

## 2016-11-01 NOTE — Progress Notes (Signed)
Subjective: Postpartum Day 3: Cesarean Delivery Patient reports incisional pain, tolerating PO and no problems voiding.   Objective: Vital signs in last 24 hours: Temp:  [97.8 F (36.6 C)-98.1 F (36.7 C)] 98.1 F (36.7 C) (02/13 0636) Pulse Rate:  [72-84] 72 (02/13 0636) Resp:  [18] 18 (02/13 0636) BP: (124-139)/(77-90) 139/90 (02/13 0636)  Physical Exam:  General: alert and cooperative Lochia: appropriate Uterine Fundus: firm Incision: C/D/I    Recent Labs  10/29/16 1455 10/30/16 0528  HGB 10.7* 9.6*  HCT 31.1* 27.8*    Assessment/Plan: Status post Cesarean section. Doing well postoperatively.  Discharge home with standard precautions and return to office in 2 weeks for incision check. BP have been WNL since delivery on no meds  Morgan Chang W 11/01/2016, 9:15 AM

## 2016-11-01 NOTE — Discharge Summary (Signed)
OB Discharge Summary     Patient Name: Morgan Chang DOB: Jun 10, 1996 MRN: [id number]  Date of admission: 10/29/2016 Delivering MD: Janyth Contes   Date of discharge: 11/01/2016  Admitting diagnosis: 38 WKS, BREECH, CTXS Intrauterine pregnancy: [redacted]w[redacted]d     Secondary diagnosis:  Principal Problem:   S/P cesarean section Active Problems:   Pilar Plate breech presentation Gestational Hypertension     Discharge diagnosis: Term Pregnancy Delivered                                                                                                Post partum procedures:none  Complications: None  Hospital course:  Sceduled C/S   21 y.o. yo G3P1020 at [redacted]w[redacted]d was admitted to the hospital 10/29/2016 for scheduled cesarean section with the following indication:Malpresentation.  Membrane Rupture Time/Date: 9:13 PM ,10/29/2016   Patient delivered a Viable infant.10/29/2016  Details of operation can be found in separate operative note.  Pateint had an uncomplicated postpartum course.  She is ambulating, tolerating a regular diet, passing flatus, and urinating well. Patient is discharged home in stable condition on  11/01/16         Physical exam  Vitals:   10/30/16 2030 10/30/16 2350 10/31/16 1805 11/01/16 0636  BP: 124/70 114/75 124/77 139/90  Pulse: 88 100 84 72  Resp: 18 16 18 18   Temp: 98.1 F (36.7 C) 98.4 F (36.9 C) 97.8 F (36.6 C) 98.1 F (36.7 C)  TempSrc: Oral Oral Oral Oral  SpO2: 98%     Weight:      Height:       General: alert and cooperative Lochia: appropriate Uterine Fundus: firm Incision: Dressing is clean, dry, and intact  Labs: Lab Results  Component Value Date   WBC 20.4 (H) 10/30/2016   HGB 9.6 (L) 10/30/2016   HCT 27.8 (L) 10/30/2016   MCV 88.0 10/30/2016   PLT 251 10/30/2016   CMP Latest Ref Rng & Units 10/29/2016  Glucose 65 - 99 mg/dL 113(H)  BUN 6 - 20 mg/dL 7  Creatinine 0.44 - 1.00 mg/dL 0.49  Sodium 135 - 145 mmol/L 133(L)  Potassium 3.5 - 5.1  mmol/L 3.5  Chloride 101 - 111 mmol/L 104  CO2 22 - 32 mmol/L 19(L)  Calcium 8.9 - 10.3 mg/dL 9.0  Total Protein 6.5 - 8.1 g/dL 6.4(L)  Total Bilirubin 0.3 - 1.2 mg/dL 0.4  Alkaline Phos 38 - 126 U/L 172(H)  AST 15 - 41 U/L 19  ALT 14 - 54 U/L 14    Discharge instruction: per After Visit Summary and "Baby and Me Booklet".  After visit meds:  Allergies as of 11/01/2016   No Known Allergies     Medication List    STOP taking these medications   pantoprazole 20 MG tablet Commonly known as:  PROTONIX     TAKE these medications   acetaminophen 500 MG tablet Commonly known as:  TYLENOL Take 1,000 mg by mouth every 6 (six) hours as needed for mild pain, moderate pain or headache.   ibuprofen 800 MG tablet Commonly known as:  ADVIL,MOTRIN Take 1 tablet (  800 mg total) by mouth every 8 (eight) hours.   Oxycodone HCl 10 MG Tabs Take 1 tablet (10 mg total) by mouth every 4 (four) hours as needed (pain scale > 7).   PRENATAL GUMMIES/DHA & FA 0.4-32.5 MG Chew Chew 2 each by mouth at bedtime.   sertraline 50 MG tablet Commonly known as:  ZOLOFT Take 50 mg by mouth at bedtime.       Diet: routine diet  Activity: Advance as tolerated. Pelvic rest for 6 weeks.   Outpatient follow up:2 weeks Follow up Appt:No future appointments. Follow up Visit:No Follow-up on file.  Postpartum contraception: Undecided  Newborn Data: Live born female  Birth Weight: 6 lb 14.9 oz (3145 g) APGAR: 7, 8  Baby Feeding: Breast Disposition:home with mother   11/01/2016 Logan Bores, MD

## 2016-11-02 LAB — TYPE AND SCREEN
ABO/RH(D): O NEG
ANTIBODY SCREEN: POSITIVE
DAT, IgG: NEGATIVE
UNIT DIVISION: 0
Unit division: 0

## 2016-11-11 ENCOUNTER — Inpatient Hospital Stay (HOSPITAL_COMMUNITY): Admission: RE | Admit: 2016-11-11 | Source: Ambulatory Visit | Admitting: Obstetrics and Gynecology

## 2016-11-11 SURGERY — Surgical Case
Anesthesia: Regional

## 2017-05-10 NOTE — Addendum Note (Signed)
Addendum  created 05/10/17 1150 by Albertha Ghee, MD   Sign clinical note

## 2018-08-13 DIAGNOSIS — N644 Mastodynia: Secondary | ICD-10-CM | POA: Insufficient documentation

## 2018-08-13 DIAGNOSIS — N6310 Unspecified lump in the right breast, unspecified quadrant: Secondary | ICD-10-CM | POA: Insufficient documentation

## 2020-03-11 DIAGNOSIS — G479 Sleep disorder, unspecified: Secondary | ICD-10-CM | POA: Insufficient documentation

## 2020-03-11 DIAGNOSIS — F331 Major depressive disorder, recurrent, moderate: Secondary | ICD-10-CM | POA: Insufficient documentation

## 2020-03-11 DIAGNOSIS — F411 Generalized anxiety disorder: Secondary | ICD-10-CM | POA: Insufficient documentation

## 2021-02-17 ENCOUNTER — Inpatient Hospital Stay (HOSPITAL_COMMUNITY)
Admission: AD | Admit: 2021-02-17 | Discharge: 2021-02-18 | Disposition: A | Discharge status: Home / Self Care | Attending: Obstetrics and Gynecology | Admitting: Obstetrics and Gynecology

## 2021-02-17 ENCOUNTER — Encounter (HOSPITAL_COMMUNITY): Payer: Self-pay | Admitting: Obstetrics and Gynecology

## 2021-02-17 ENCOUNTER — Other Ambulatory Visit: Payer: Self-pay

## 2021-02-17 DIAGNOSIS — R102 Pelvic and perineal pain: Secondary | ICD-10-CM | POA: Insufficient documentation

## 2021-02-17 DIAGNOSIS — Z3A14 14 weeks gestation of pregnancy: Secondary | ICD-10-CM | POA: Diagnosis not present

## 2021-02-17 DIAGNOSIS — O26892 Other specified pregnancy related conditions, second trimester: Secondary | ICD-10-CM | POA: Insufficient documentation

## 2021-02-17 DIAGNOSIS — N9489 Other specified conditions associated with female genital organs and menstrual cycle: Secondary | ICD-10-CM

## 2021-02-17 DIAGNOSIS — R109 Unspecified abdominal pain: Secondary | ICD-10-CM | POA: Diagnosis not present

## 2021-02-17 DIAGNOSIS — Z3A15 15 weeks gestation of pregnancy: Secondary | ICD-10-CM | POA: Diagnosis not present

## 2021-02-17 LAB — CBC WITH DIFFERENTIAL/PLATELET
Abs Immature Granulocytes: 0.05 10*3/uL (ref 0.00–0.07)
Basophils Absolute: 0 10*3/uL (ref 0.0–0.1)
Basophils Relative: 0 %
Eosinophils Absolute: 0.1 10*3/uL (ref 0.0–0.5)
Eosinophils Relative: 1 %
HCT: 36.7 % (ref 36.0–46.0)
Hemoglobin: 12.9 g/dL (ref 12.0–15.0)
Immature Granulocytes: 0 %
Lymphocytes Relative: 22 %
Lymphs Abs: 2.8 10*3/uL (ref 0.7–4.0)
MCH: 31.6 pg (ref 26.0–34.0)
MCHC: 35.1 g/dL (ref 30.0–36.0)
MCV: 90 fL (ref 80.0–100.0)
Monocytes Absolute: 0.8 10*3/uL (ref 0.1–1.0)
Monocytes Relative: 6 %
Neutro Abs: 8.8 10*3/uL — ABNORMAL HIGH (ref 1.7–7.7)
Neutrophils Relative %: 71 %
Platelets: 254 10*3/uL (ref 150–400)
RBC: 4.08 MIL/uL (ref 3.87–5.11)
RDW: 12.2 % (ref 11.5–15.5)
WBC: 12.6 10*3/uL — ABNORMAL HIGH (ref 4.0–10.5)
nRBC: 0 % (ref 0.0–0.2)

## 2021-02-17 LAB — URINALYSIS, ROUTINE W REFLEX MICROSCOPIC
Bilirubin Urine: NEGATIVE
Glucose, UA: NEGATIVE mg/dL
Hgb urine dipstick: NEGATIVE
Ketones, ur: NEGATIVE mg/dL
Leukocytes,Ua: NEGATIVE
Nitrite: NEGATIVE
Protein, ur: NEGATIVE mg/dL
Specific Gravity, Urine: 1.013 (ref 1.005–1.030)
pH: 7 (ref 5.0–8.0)

## 2021-02-17 MED ORDER — IBUPROFEN 600 MG PO TABS
600.0000 mg | ORAL_TABLET | Freq: Once | ORAL | Status: AC
  Administered 2021-02-17: 600 mg via ORAL
  Filled 2021-02-17: qty 1

## 2021-02-17 NOTE — MAU Note (Signed)
Having abd pains since last night that feel like ctxs. Have been worse today. Called office and was told to come to MAU. Denies VB or d/c

## 2021-02-17 NOTE — MAU Provider Note (Signed)
Chief Complaint: Abdominal Pain   Event Date/Time   First Provider Initiated Contact with Patient 02/17/21 2157        SUBJECTIVE HPI: Morgan Chang is a 25 y.o. I4P3295 at [redacted]w[redacted]d by LMP who presents to maternity admissions reporting pelvic pain that comes and goes like contractions.   They got worse today.  No bleeding or leaking. . She denies vaginal bleeding, vaginal itching/burning, urinary symptoms, h/a, dizziness, n/v, or fever/chills.    Abdominal Pain This is a new problem. The current episode started today. The onset quality is gradual. The problem occurs intermittently. The problem has been unchanged. The pain is located in the suprapubic region, RLQ and LLQ. The pain is mild. The quality of the pain is cramping. The abdominal pain does not radiate. Pertinent negatives include no anorexia, constipation, diarrhea, dysuria, fever, frequency, headaches, myalgias, nausea or vomiting. Nothing aggravates the pain. The pain is relieved by nothing. She has tried nothing for the symptoms.   RN Note: Having abd pains since last night that feel like ctxs. Have been worse today. Called office and was told to come to MAU. Denies VB or d/c  Past Medical History:  Diagnosis Date  . Anxiety   . Depression   . Frank breech presentation 10/29/2016  . Migraines   . S/P cesarean section 10/29/2016   Past Surgical History:  Procedure Laterality Date  . ADENOIDECTOMY  10/29/2007  . BREAST REDUCTION SURGERY    . CESAREAN SECTION N/A 10/29/2016   Procedure: CESAREAN SECTION;  Surgeon: Janyth Contes, MD;  Location: [facility];  Service: Obstetrics;  Laterality: N/A;  . LAPAROSCOPY  12/10/2014  . NISSEN FUNDOPLICATION    . RHINOPLASTY  03/22/2010   deviated septum  . TONSILLECTOMY  2017   Social History   Socioeconomic History  . Marital status: Married    Spouse name: Not on file  . Number of children: Not on file  . Years of education: Not on file  . Highest education level:  Not on file  Occupational History  . Occupation: Homemaker  Tobacco Use  . Smoking status: Never Smoker  . Smokeless tobacco: Never Used  Substance and Sexual Activity  . Alcohol use: No  . Drug use: No  . Sexual activity: Yes    Birth control/protection: None  Other Topics Concern  . Not on file  Social History Narrative  . Not on file   Social Determinants of Health   Financial Resource Strain: Not on file  Food Insecurity: Not on file  Transportation Needs: Not on file  Physical Activity: Not on file  Stress: Not on file  Social Connections: Not on file  Intimate Partner Violence: Not on file   No current facility-administered medications on file prior to encounter.   Current Outpatient Medications on File Prior to Encounter  Medication Sig Dispense Refill  . acetaminophen (TYLENOL) 500 MG tablet Take 1,000 mg by mouth every 6 (six) hours as needed for mild pain, moderate pain or headache.    . mirtazapine (REMERON) 15 MG tablet Take 15 mg by mouth at bedtime.    . Prenatal MV-Min-FA-Omega-3 (PRENATAL GUMMIES/DHA & FA) 0.4-32.5 MG CHEW Chew 2 each by mouth at bedtime.    . sertraline (ZOLOFT) 50 MG tablet Take 100 mg by mouth at bedtime.    Marland Kitchen ibuprofen (ADVIL,MOTRIN) 800 MG tablet Take 1 tablet (800 mg total) by mouth every 8 (eight) hours. 30 tablet 0  . oxyCODONE 10 MG TABS Take 1 tablet (10  mg total) by mouth every 4 (four) hours as needed (pain scale > 7). 30 tablet 0   No Known Allergies  I have reviewed patient's Past Medical Hx, Surgical Hx, Family Hx, Social Hx, medications and allergies.   ROS:  Review of Systems  Constitutional: Negative for fever.  Gastrointestinal: Positive for abdominal pain. Negative for anorexia, constipation, diarrhea, nausea and vomiting.  Genitourinary: Negative for dysuria and frequency.  Musculoskeletal: Negative for myalgias.  Neurological: Negative for headaches.   Review of Systems  Other systems negative   Physical Exam   Physical Exam Patient Vitals for the past 24 hrs:  BP Temp Pulse Resp SpO2 Height Weight  02/17/21 2154 118/89 -- (!) 136 -- 99 % -- --  02/17/21 1925 115/79 98.2 F (36.8 C) 86 18 -- 5\' 6"  (1.676 m) 99.8 kg   Constitutional: Well-developed, well-nourished female in no acute distress.  Cardiovascular: normal rate Respiratory: normal effort GI: Abd soft, non-tender. Pos BS  No guarding or rebound MS: Extremities nontender, no edema, normal ROM Neurologic: Alert and oriented x 4.  GU: Neg CVAT.  PELVIC EXAM: Long and closed  FHT 151 by doppler  LAB RESULTS Results for orders placed or performed during the hospital encounter of 02/17/21 (from the past 24 hour(s))  Urinalysis, Routine w reflex microscopic Urine, Clean Catch     Status: Abnormal   Collection Time: 02/17/21  7:33 PM  Result Value Ref Range   Color, Urine STRAW (A) YELLOW   APPearance CLEAR CLEAR   Specific Gravity, Urine 1.013 1.005 - 1.030   pH 7.0 5.0 - 8.0   Glucose, UA NEGATIVE NEGATIVE mg/dL   Hgb urine dipstick NEGATIVE NEGATIVE   Bilirubin Urine NEGATIVE NEGATIVE   Ketones, ur NEGATIVE NEGATIVE mg/dL   Protein, ur NEGATIVE NEGATIVE mg/dL   Nitrite NEGATIVE NEGATIVE   Leukocytes,Ua NEGATIVE NEGATIVE       IMAGING No results found.  MAU Management/MDM: Ordered UA to rule out UTI or stone as source of pain, normal UA Ordered Ibuprofen for pelvic cramping which did relieve her pain Discussed this is likely uterine cramping due to growing uterus/stretching Reassured since we were able to stop it and her cervix is closed/long, this is likely not preterm labor or incompetent cervix.  Discussed precautions to return for.   ASSESSMENT Single IUP at [redacted]w[redacted]d Uterine cramping  PLAN Discharge home Push fluids until urine clear PTL precautions States has appt coming up soon.   Pt stable at time of discharge. Encouraged to return here if she develops worsening of symptoms, increase in pain, fever, or  other concerning symptoms.    Hansel Feinstein CNM, MSN Certified Nurse-Midwife 02/17/2021  9:57 PM

## 2021-02-18 DIAGNOSIS — Z3A14 14 weeks gestation of pregnancy: Secondary | ICD-10-CM

## 2021-02-18 DIAGNOSIS — R109 Unspecified abdominal pain: Secondary | ICD-10-CM

## 2021-02-18 DIAGNOSIS — O26892 Other specified pregnancy related conditions, second trimester: Secondary | ICD-10-CM

## 2021-02-18 NOTE — Discharge Instructions (Signed)
Second Trimester of Pregnancy  The second trimester of pregnancy is from week 13 through week 27. This is months 4 through 6 of pregnancy. The second trimester is often a time when you feel your best. Your body has adjusted to being pregnant, and you begin to feel better physically. During the second trimester:  Morning sickness has lessened or stopped completely.  You may have more energy.  You may have an increase in appetite. The second trimester is also a time when the unborn baby (fetus) is growing rapidly. At the end of the sixth month, the fetus may be up to 12 inches long and weigh about 1 pounds. You will likely begin to feel the baby move (quickening) between 16 and 20 weeks of pregnancy. Body changes during your second trimester Your body continues to go through many changes during your second trimester. The changes vary and generally return to normal after the baby is born. Physical changes  Your weight will continue to increase. You will notice your lower abdomen bulging out.  You may begin to get stretch marks on your hips, abdomen, and breasts.  Your breasts will continue to grow and to become tender.  Dark spots or blotches (chloasma or mask of pregnancy) may develop on your face.  A dark line from your belly button to the pubic area (linea nigra) may appear.  You may have changes in your hair. These can include thickening of your hair, rapid growth, and changes in texture. Some people also have hair loss during or after pregnancy, or hair that feels dry or thin. Health changes  You may develop headaches.  You may have heartburn.  You may develop constipation.  You may develop hemorrhoids or swollen, bulging veins (varicose veins).  Your gums may bleed and may be sensitive to brushing and flossing.  You may urinate more often because the fetus is pressing on your bladder.  You may have back pain. This is caused by: ? Weight gain. ? Pregnancy hormones that  are relaxing the joints in your pelvis. ? A shift in weight and the muscles that support your balance. Follow these instructions at home: Medicines  Follow your health care provider's instructions regarding medicine use. Specific medicines may be either safe or unsafe to take during pregnancy. Do not take any medicines unless approved by your health care provider.  Take a prenatal vitamin that contains at least 600 micrograms (mcg) of folic acid. Eating and drinking  Eat a healthy diet that includes fresh fruits and vegetables, whole grains, good sources of protein such as meat, eggs, or tofu, and low-fat dairy products.  Avoid raw meat and unpasteurized juice, milk, and cheese. These carry germs that can harm you and your baby.  You may need to take these actions to prevent or treat constipation: ? Drink enough fluid to keep your urine pale yellow. ? Eat foods that are high in fiber, such as beans, whole grains, and fresh fruits and vegetables. ? Limit foods that are high in fat and processed sugars, such as fried or sweet foods. Activity  Exercise only as directed by your health care provider. Most people can continue their usual exercise routine during pregnancy. Try to exercise for 30 minutes at least 5 days a week. Stop exercising if you develop contractions in your uterus.  Stop exercising if you develop pain or cramping in the lower abdomen or lower back.  Avoid exercising if it is very hot or humid or if you are at  a high altitude.  Avoid heavy lifting.  If you choose to, you may have sex unless your health care provider tells you not to. Relieving pain and discomfort  Wear a supportive bra to prevent discomfort from breast tenderness.  Take warm sitz baths to soothe any pain or discomfort caused by hemorrhoids. Use hemorrhoid cream if your health care provider approves.  Rest with your legs raised (elevated) if you have leg cramps or low back pain.  If you develop  varicose veins: ? Wear support hose as told by your health care provider. ? Elevate your feet for 15 minutes, 3-4 times a day. ? Limit salt in your diet. Safety  Wear your seat belt at all times when driving or riding in a car.  Talk with your health care provider if someone is verbally or physically abusive to you. Lifestyle  Do not use hot tubs, steam rooms, or saunas.  Do not douche. Do not use tampons or scented sanitary pads.  Avoid cat litter boxes and soil used by cats. These carry germs that can cause birth defects in the baby and possibly loss of the fetus by miscarriage or stillbirth.  Do not use herbal remedies, alcohol, illegal drugs, or medicines that are not approved by your health care provider. Chemicals in these products can harm your baby.  Do not use any products that contain nicotine or tobacco, such as cigarettes, e-cigarettes, and chewing tobacco. If you need help quitting, ask your health care provider. General instructions  During a routine prenatal visit, your health care provider will do a physical exam and other tests. He or she will also discuss your overall health. Keep all follow-up visits. This is important.  Ask your health care provider for a referral to a local prenatal education class.  Ask for help if you have counseling or nutritional needs during pregnancy. Your health care provider can offer advice or refer you to specialists for help with various needs. Where to find more information  American Pregnancy Association: americanpregnancy.[place] and Gynecologists: PoolDevices.com.pt  Office on [employer]: KeywordPortfolios.com.br Contact a health care provider if you have:  A headache that does not go away when you take medicine.  Vision changes or you see spots in front of your eyes.  Mild pelvic cramps, pelvic pressure, or nagging pain in the abdominal area.  Persistent nausea,  vomiting, or diarrhea.  A bad-smelling vaginal discharge or foul-smelling urine.  Pain when you urinate.  Sudden or extreme swelling of your face, hands, ankles, feet, or legs.  A fever. Get help right away if you:  Have fluid leaking from your vagina.  Have spotting or bleeding from your vagina.  Have severe abdominal cramping or pain.  Have difficulty breathing.  Have chest pain.  Have fainting spells.  Have not felt your baby move for the time period told by your health care provider.  Have new or increased pain, swelling, or redness in an arm or leg. Summary  The second trimester of pregnancy is from week 13 through week 27 (months 4 through 6).  Do not use herbal remedies, alcohol, illegal drugs, or medicines that are not approved by your health care provider. Chemicals in these products can harm your baby.  Exercise only as directed by your health care provider. Most people can continue their usual exercise routine during pregnancy.  Keep all follow-up visits. This is important. This information is not intended to replace advice given to you by your  health care provider. Make sure you discuss any questions you have with your health care provider. Document Revised: 02/12/2020 Document Reviewed: 12/19/2019 Elsevier Patient Education  2021 [employer].

## 2021-03-10 ENCOUNTER — Inpatient Hospital Stay (HOSPITAL_COMMUNITY)

## 2021-03-10 ENCOUNTER — Encounter (HOSPITAL_COMMUNITY): Payer: Self-pay | Admitting: Obstetrics and Gynecology

## 2021-03-10 ENCOUNTER — Inpatient Hospital Stay (HOSPITAL_COMMUNITY)
Admission: AD | Admit: 2021-03-10 | Discharge: 2021-03-10 | Disposition: A | Discharge status: Home / Self Care | Attending: Obstetrics and Gynecology | Admitting: Obstetrics and Gynecology

## 2021-03-10 DIAGNOSIS — Z79899 Other long term (current) drug therapy: Secondary | ICD-10-CM | POA: Diagnosis not present

## 2021-03-10 DIAGNOSIS — R55 Syncope and collapse: Secondary | ICD-10-CM | POA: Insufficient documentation

## 2021-03-10 DIAGNOSIS — R42 Dizziness and giddiness: Secondary | ICD-10-CM

## 2021-03-10 DIAGNOSIS — Z3A18 18 weeks gestation of pregnancy: Secondary | ICD-10-CM | POA: Diagnosis not present

## 2021-03-10 DIAGNOSIS — G44209 Tension-type headache, unspecified, not intractable: Secondary | ICD-10-CM

## 2021-03-10 DIAGNOSIS — R1110 Vomiting, unspecified: Secondary | ICD-10-CM | POA: Insufficient documentation

## 2021-03-10 DIAGNOSIS — O99891 Other specified diseases and conditions complicating pregnancy: Secondary | ICD-10-CM

## 2021-03-10 DIAGNOSIS — R197 Diarrhea, unspecified: Secondary | ICD-10-CM | POA: Insufficient documentation

## 2021-03-10 DIAGNOSIS — O99352 Diseases of the nervous system complicating pregnancy, second trimester: Secondary | ICD-10-CM | POA: Diagnosis not present

## 2021-03-10 DIAGNOSIS — O26892 Other specified pregnancy related conditions, second trimester: Secondary | ICD-10-CM | POA: Diagnosis not present

## 2021-03-10 DIAGNOSIS — Z3A17 17 weeks gestation of pregnancy: Secondary | ICD-10-CM

## 2021-03-10 HISTORY — DX: Gestational (pregnancy-induced) hypertension without significant proteinuria, unspecified trimester: O13.9

## 2021-03-10 HISTORY — DX: Endometriosis, unspecified: N80.9

## 2021-03-10 HISTORY — DX: Benign neoplasm of connective and other soft tissue, unspecified: D21.9

## 2021-03-10 LAB — COMPREHENSIVE METABOLIC PANEL
ALT: 35 U/L (ref 0–44)
AST: 24 U/L (ref 15–41)
Albumin: 3.2 g/dL — ABNORMAL LOW (ref 3.5–5.0)
Alkaline Phosphatase: 58 U/L (ref 38–126)
Anion gap: 7 (ref 5–15)
BUN: 5 mg/dL — ABNORMAL LOW (ref 6–20)
CO2: 25 mmol/L (ref 22–32)
Calcium: 9 mg/dL (ref 8.9–10.3)
Chloride: 105 mmol/L (ref 98–111)
Creatinine, Ser: 0.54 mg/dL (ref 0.44–1.00)
GFR, Estimated: >60 mL/min (ref >60)
Glucose, Bld: 85 mg/dL (ref 70–99)
Potassium: 4 mmol/L (ref 3.5–5.1)
Sodium: 137 mmol/L (ref 135–145)
Total Bilirubin: 0.6 mg/dL (ref 0.3–1.2)
Total Protein: 6.2 g/dL — ABNORMAL LOW (ref 6.5–8.1)

## 2021-03-10 LAB — URINALYSIS, ROUTINE W REFLEX MICROSCOPIC
Bilirubin Urine: NEGATIVE
Glucose, UA: NEGATIVE mg/dL
Hgb urine dipstick: NEGATIVE
Ketones, ur: NEGATIVE mg/dL
Leukocytes,Ua: NEGATIVE
Nitrite: NEGATIVE
Protein, ur: NEGATIVE mg/dL
Specific Gravity, Urine: 1.005 (ref 1.005–1.030)
pH: 7 (ref 5.0–8.0)

## 2021-03-10 LAB — CBC
HCT: 38.9 % (ref 36.0–46.0)
Hemoglobin: 13.2 g/dL (ref 12.0–15.0)
MCH: 31.7 pg (ref 26.0–34.0)
MCHC: 33.9 g/dL (ref 30.0–36.0)
MCV: 93.5 fL (ref 80.0–100.0)
Platelets: 265 10*3/uL (ref 150–400)
RBC: 4.16 MIL/uL (ref 3.87–5.11)
RDW: 12.4 % (ref 11.5–15.5)
WBC: 10.6 10*3/uL — ABNORMAL HIGH (ref 4.0–10.5)
nRBC: 0 % (ref 0.0–0.2)

## 2021-03-10 LAB — GLUCOSE, CAPILLARY: Glucose-Capillary: 90 mg/dL (ref 70–99)

## 2021-03-10 MED ORDER — BUTALBITAL-APAP-CAFFEINE 50-325-40 MG PO TABS
2.0000 | ORAL_TABLET | ORAL | Status: DC | PRN
  Administered 2021-03-10: 2 via ORAL
  Filled 2021-03-10: qty 2

## 2021-03-10 MED ORDER — DIPHENHYDRAMINE HCL 50 MG/ML IJ SOLN: 12.5000 mg | Freq: Once | INTRAMUSCULAR | Status: DC

## 2021-03-10 MED ORDER — LACTATED RINGERS IV BOLUS: 1000.0000 mL | Freq: Once | INTRAVENOUS | Status: DC

## 2021-03-10 MED ORDER — ACETAMINOPHEN 325 MG PO TABS: 650.0000 mg | ORAL_TABLET | Freq: Once | ORAL | Status: DC

## 2021-03-10 MED ORDER — METOCLOPRAMIDE HCL 5 MG/ML IJ SOLN: 10.0000 mg | Freq: Once | INTRAMUSCULAR | Status: DC

## 2021-03-10 MED ORDER — DEXAMETHASONE SODIUM PHOSPHATE 10 MG/ML IJ SOLN: 10.0000 mg | Freq: Once | INTRAMUSCULAR | Status: DC

## 2021-03-10 NOTE — MAU Provider Note (Signed)
Patient Morgan Chang is a 25 y.o. [phone]  At [redacted]w[redacted]d here with complaints of "feeling off", fatigue, "spinning room when she walks" and " seeing objects sideways".  This started last night after she passed out. She was outside at her neighborhood [employer] standing in line and she felt "everything go black". She sat down but she did not hit her head. She started to feel like everything was "black".   She denies vaginal bleeding, LOF, contractions. She had pre-e with her son and had a s/p for Breech at 60 weeks. She does not have high blood pressure outside of pregnancy.   She had migraines when she was younger but no migraines in the past 3 years.  History     CSN: [id number]  Arrival date and time: 03/10/21 1115   None     Chief Complaint  Patient presents with   Emesis   Loss of Consciousness   Diarrhea   Emesis  This is a new problem. The current episode started today. The problem occurs 2 to 4 times per day. The emesis has an appearance of bile and stomach contents. There has been no fever. Associated symptoms include diarrhea, dizziness and headaches. Pertinent negatives include no abdominal pain, chest pain, chills, coughing or fever.  Loss of Consciousness This is a new problem. The current episode started yesterday. The symptoms are aggravated by standing. Associated symptoms include dizziness, headaches, vomiting and weakness. Pertinent negatives include no abdominal pain, chest pain, diaphoresis or fever.  Diarrhea  Associated symptoms include headaches and vomiting. Pertinent negatives include no abdominal pain, chills, coughing or fever.  Headache  This is a new problem. The current episode started yesterday. The problem has been gradually worsening. The pain is located in the Right unilateral region. The pain does not radiate. The pain quality is not similar to prior headaches. Quality: dull to sharp pain. The pain is at a severity of 6/10. Associated symptoms include  dizziness, vomiting and weakness. Pertinent negatives include no abdominal pain, coughing or fever.  Dizziness This is a new problem. The current episode started yesterday. Progression since onset: she is "more dizzy now" than before I passed out. Associated symptoms include headaches, vomiting and weakness. Pertinent negatives include no abdominal pain, chest pain, chills, congestion, coughing, diaphoresis or fever.   OB History     Gravida  4   Para  1   Term  1   Preterm      AB  2   Living  1      SAB  2   IAB      Ectopic      Multiple  0   Live Births           Obstetric Comments  C/s for breech         Past Medical History:  Diagnosis Date   Anxiety    Depression    Endometriosis    Fibroid    Frank breech presentation 10/29/2016   Migraines    Pregnancy induced hypertension    S/P cesarean section 10/29/2016    Past Surgical History:  Procedure Laterality Date   ADENOIDECTOMY  10/29/2007   BREAST REDUCTION SURGERY     CESAREAN SECTION N/A 10/29/2016   Procedure: CESAREAN SECTION;  Surgeon: Morgan Contes, MD;  Location: [facility];  Service: Obstetrics;  Laterality: N/A;   LAPAROSCOPY  00/86/7619   NISSEN FUNDOPLICATION     RHINOPLASTY  03/22/2010   deviated septum   TONSILLECTOMY  2017    Family History  Problem Relation Age of Onset   Heart disease Mother    Hypertension Mother    Hypertension Father     Social History   Tobacco Use   Smoking status: Never   Smokeless tobacco: Never  Vaping Use   Vaping Use: Never used  Substance Use Topics   Alcohol use: No   Drug use: No    Allergies: No Known Allergies  Medications Prior to Admission  Medication Sig Dispense Refill Last Dose   acetaminophen (TYLENOL) 500 MG tablet Take 1,000 mg by mouth every 6 (six) hours as needed for mild pain, moderate pain or headache.   Past Week   famotidine (PEPCID) 40 MG tablet Take 40 mg by mouth daily.   03/09/2021   Prenatal  MV-Min-FA-Omega-3 (PRENATAL GUMMIES/DHA & FA) 0.4-32.5 MG CHEW Chew 2 each by mouth at bedtime.   03/09/2021   sertraline (ZOLOFT) 50 MG tablet Take 100 mg by mouth at bedtime.   03/09/2021   mirtazapine (REMERON) 15 MG tablet Take 15 mg by mouth at bedtime.       Review of Systems  Constitutional:  Negative for chills, diaphoresis and fever.  HENT:  Negative for congestion.   Respiratory:  Negative for cough.   Cardiovascular:  Positive for syncope. Negative for chest pain.  Gastrointestinal:  Positive for diarrhea and vomiting. Negative for abdominal pain.  Neurological:  Positive for dizziness, weakness and headaches.  Physical Exam   Blood pressure 113/75, pulse 83, temperature 98.3 F (36.8 C), temperature source Oral, resp. rate 17, SpO2 100 %, unknown if currently breastfeeding.  Physical Exam Constitutional:      Appearance: Normal appearance.  Pulmonary:     Effort: Pulmonary effort is normal.  Abdominal:     General: Abdomen is flat.  Musculoskeletal:        General: No swelling or deformity. Normal range of motion.  Skin:    General: Skin is warm.  Neurological:     General: No focal deficit present.     Mental Status: She is alert and oriented to person, place, and time.     Sensory: No sensory deficit.     Motor: No weakness.     Coordination: Coordination normal.    MAU Course  Procedures  MDM -patient is alert and oriented times 4; -FHR is 156 -UA is normal, no signs of dehydrartion  -given patients complaint of HA, vomiting today, syncope and dizziness will do CT scan. Dr Harolyn Rutherford aware of plan of care.    -EKG shows normal sinus rhythm  -Blood sugar is 90; she had eggs and strawberries this morning  1440: Patient is well appearing, however, is now complaining of seeing more items blurry 1500: Discussed with Neurologist, who recommends that patient have MRI, rather than CT. Orders placed and neurologist will call for MRI instead of CT.   1515: call to  MRI, was notified that patient's wait was 40 min. Expressed to MRI that patient was pregnant with headache and visual changes; MRI will expedite transport.   1530: patient alert and oriented times 3; informed her that we are waiting for transport and that when she comes back we will try HA cocktail.   -MRI is negative; no concerning findings. Will try HA cocktail -switched from HA cocktail to fioricet  1957: Patient reports that HA is a 2/10. Her husband is at the bedside; they are laughing and talking. She desires discharge.   Assessment and Plan  1. Dizziness   2. Acute non intractable tension-type headache   -patient is stable for discharge; reviewed Tylenol and comfort measures, as well as measures to prevent further dizziness (snacks, water) -reviewed MRI findings, which are reassuring.  -Keep gestational diabetes screening for next week -return to MAU if symptoms return or worsen  Mervyn Skeeters Gaia Gullikson 03/10/2021, 1:05 PM

## 2021-03-10 NOTE — MAU Note (Signed)
MRI notified of patient status per Maye Hides, CNM, request. MRI stated within the next 40 minutes.

## 2021-03-10 NOTE — MAU Note (Signed)
Last night was outside.  Knees started getting really sweaty, all of the sudden could hear people- "just noises, couldn't understand them", and things went dark, ended up laying on the sidewalk.  This morning still seemed off, dizzy.  Has vomitted and had diarrhea(watery).

## 2021-03-11 DIAGNOSIS — R42 Dizziness and giddiness: Secondary | ICD-10-CM

## 2021-04-14 ENCOUNTER — Encounter: Payer: Self-pay | Admitting: Registered"

## 2021-04-14 ENCOUNTER — Encounter: Attending: Obstetrics | Admitting: Registered"

## 2021-04-14 ENCOUNTER — Other Ambulatory Visit: Payer: Self-pay

## 2021-04-14 DIAGNOSIS — O24419 Gestational diabetes mellitus in pregnancy, unspecified control: Secondary | ICD-10-CM | POA: Diagnosis not present

## 2021-04-14 NOTE — Progress Notes (Signed)
Patient was seen on 04/14/21 for Gestational Diabetes self-management class at the [facility]. The following learning objectives were met by the patient during this course:  States the definition of Gestational Diabetes States why dietary management is important in controlling blood glucose Describes the effects each nutrient has on blood glucose levels Demonstrates ability to create a balanced meal plan Demonstrates carbohydrate counting  States when to check blood glucose levels Demonstrates proper blood glucose monitoring techniques States the effect of stress and exercise on blood glucose levels States the importance of limiting caffeine and abstaining from alcohol and smoking  Blood glucose monitor given: Patient has meter and is checking blood sugar prior to class  Patient instructed to monitor glucose levels: FBS: 60 - <95; 1 hour: <140; 2 hour: <120  Patient received handouts: Nutrition Diabetes and Pregnancy, including carb counting list  Patient will be seen for follow-up as needed. 

## 2021-06-12 ENCOUNTER — Inpatient Hospital Stay (HOSPITAL_COMMUNITY)
Admission: AD | Admit: 2021-06-12 | Discharge: 2021-06-12 | Disposition: A | Discharge status: Home / Self Care | Attending: Obstetrics and Gynecology | Admitting: Obstetrics and Gynecology

## 2021-06-12 ENCOUNTER — Encounter (HOSPITAL_COMMUNITY): Payer: Self-pay | Admitting: Obstetrics and Gynecology

## 2021-06-12 DIAGNOSIS — Z3A31 31 weeks gestation of pregnancy: Secondary | ICD-10-CM | POA: Diagnosis not present

## 2021-06-12 DIAGNOSIS — R519 Headache, unspecified: Secondary | ICD-10-CM | POA: Diagnosis not present

## 2021-06-12 DIAGNOSIS — O26893 Other specified pregnancy related conditions, third trimester: Secondary | ICD-10-CM

## 2021-06-12 DIAGNOSIS — O99891 Other specified diseases and conditions complicating pregnancy: Secondary | ICD-10-CM | POA: Insufficient documentation

## 2021-06-12 DIAGNOSIS — O24414 Gestational diabetes mellitus in pregnancy, insulin controlled: Secondary | ICD-10-CM | POA: Diagnosis not present

## 2021-06-12 DIAGNOSIS — Z0371 Encounter for suspected problem with amniotic cavity and membrane ruled out: Secondary | ICD-10-CM

## 2021-06-12 DIAGNOSIS — B3731 Acute candidiasis of vulva and vagina: Secondary | ICD-10-CM

## 2021-06-12 DIAGNOSIS — B373 Candidiasis of vulva and vagina: Secondary | ICD-10-CM | POA: Diagnosis not present

## 2021-06-12 DIAGNOSIS — O10913 Unspecified pre-existing hypertension complicating pregnancy, third trimester: Secondary | ICD-10-CM | POA: Insufficient documentation

## 2021-06-12 DIAGNOSIS — O98819 Other maternal infectious and parasitic diseases complicating pregnancy, unspecified trimester: Secondary | ICD-10-CM

## 2021-06-12 LAB — CBC
HCT: 31.3 % — ABNORMAL LOW (ref 36.0–46.0)
Hemoglobin: 10.8 g/dL — ABNORMAL LOW (ref 12.0–15.0)
MCH: 30.7 pg (ref 26.0–34.0)
MCHC: 34.5 g/dL (ref 30.0–36.0)
MCV: 88.9 fL (ref 80.0–100.0)
Platelets: 270 10*3/uL (ref 150–400)
RBC: 3.52 MIL/uL — ABNORMAL LOW (ref 3.87–5.11)
RDW: 12.7 % (ref 11.5–15.5)
WBC: 11.2 10*3/uL — ABNORMAL HIGH (ref 4.0–10.5)
nRBC: 0 % (ref 0.0–0.2)

## 2021-06-12 LAB — URINALYSIS, ROUTINE W REFLEX MICROSCOPIC
Bilirubin Urine: NEGATIVE
Glucose, UA: NEGATIVE mg/dL
Hgb urine dipstick: NEGATIVE
Ketones, ur: NEGATIVE mg/dL
Nitrite: NEGATIVE
Protein, ur: NEGATIVE mg/dL
Specific Gravity, Urine: 1.026 (ref 1.005–1.030)
pH: 6 (ref 5.0–8.0)

## 2021-06-12 LAB — COMPREHENSIVE METABOLIC PANEL
ALT: 12 U/L (ref 0–44)
AST: 15 U/L (ref 15–41)
Albumin: 2.7 g/dL — ABNORMAL LOW (ref 3.5–5.0)
Alkaline Phosphatase: 90 U/L (ref 38–126)
Anion gap: 8 (ref 5–15)
BUN: 7 mg/dL (ref 6–20)
CO2: 19 mmol/L — ABNORMAL LOW (ref 22–32)
Calcium: 8.6 mg/dL — ABNORMAL LOW (ref 8.9–10.3)
Chloride: 108 mmol/L (ref 98–111)
Creatinine, Ser: 0.48 mg/dL (ref 0.44–1.00)
GFR, Estimated: >60 mL/min (ref >60)
Glucose, Bld: 83 mg/dL (ref 70–99)
Potassium: 3.7 mmol/L (ref 3.5–5.1)
Sodium: 135 mmol/L (ref 135–145)
Total Bilirubin: 0.4 mg/dL (ref 0.3–1.2)
Total Protein: 5.7 g/dL — ABNORMAL LOW (ref 6.5–8.1)

## 2021-06-12 LAB — WET PREP, GENITAL
Clue Cells Wet Prep HPF POC: NONE SEEN
Sperm: NONE SEEN
Trich, Wet Prep: NONE SEEN
Yeast Wet Prep HPF POC: NONE SEEN

## 2021-06-12 LAB — PROTEIN / CREATININE RATIO, URINE
Creatinine, Urine: 174.47 mg/dL
Protein Creatinine Ratio: 0.11 mg/mg{Cre} (ref 0.00–0.15)
Total Protein, Urine: 19 mg/dL

## 2021-06-12 LAB — AMNISURE RUPTURE OF MEMBRANE (ROM) NOT AT ARMC: Amnisure ROM: NEGATIVE

## 2021-06-12 MED ORDER — CYCLOBENZAPRINE HCL 5 MG PO TABS: 5.0000 mg | ORAL_TABLET | Freq: Three times a day (TID) | ORAL | 0 refills | Status: DC | PRN

## 2021-06-12 MED ORDER — TERCONAZOLE 0.4 % VA CREA: 1.0000 | TOPICAL_CREAM | Freq: Every day | VAGINAL | 0 refills | Status: DC

## 2021-06-12 MED ORDER — CYCLOBENZAPRINE HCL 5 MG PO TABS
10.0000 mg | ORAL_TABLET | Freq: Once | ORAL | Status: AC
  Administered 2021-06-12: 10 mg via ORAL
  Filled 2021-06-12: qty 2

## 2021-06-12 NOTE — MAU Note (Signed)
Yesterday,1400, was walking- felt a pop.  Noted her panties were wet, noted this a few more times.  This morning, felt like a gush again. Has noted an increase in [facility], a low dull back ache and insane pressure, feels like the baby is going to fall out.

## 2021-06-12 NOTE — Discharge Instructions (Signed)

## 2021-06-12 NOTE — MAU Provider Note (Signed)
History     CSN: [id number]  Arrival date and time: 06/12/21 0946   Event Date/Time   First Provider Initiated Contact with Patient 06/12/21 1047      Chief Complaint  Patient presents with   Back Pain   Contractions   Rupture of Membranes   HPI Morgan Chang is a 25 y.o. G4P1021 at [redacted]w[redacted]d who presents with possible rupture of membranes. She states she had a gush of fluid yesterday at 1400 and none until this morning. She reports constant pressure that is new but no contractions. She denies any bleeding. She reports daily headaches that start at 2pm. She has tried tylenol and magnesium supplements without relief. She feels like she is eating and drinking enough.   She reports she is being followed for hypertension in the office and is on insulin for gestational diabetes.   OB History     Gravida  4   Para  1   Term  1   Preterm      AB  2   Living  1      SAB  2   IAB      Ectopic      Multiple  0   Live Births           Obstetric Comments  C/s for breech         Past Medical History:  Diagnosis Date   Anxiety    Depression    Endometriosis    Fibroid    Frank breech presentation 10/29/2016   Migraines    Pregnancy induced hypertension    S/P cesarean section 10/29/2016    Past Surgical History:  Procedure Laterality Date   ADENOIDECTOMY  10/29/2007   BREAST REDUCTION SURGERY     CESAREAN SECTION N/A 10/29/2016   Procedure: CESAREAN SECTION;  Surgeon: Janyth Contes, MD;  Location: [place];  Service: Obstetrics;  Laterality: N/A;   LAPAROSCOPY  40/06/2724   NISSEN FUNDOPLICATION     RHINOPLASTY  03/22/2010   deviated septum   TONSILLECTOMY  2017    Family History  Problem Relation Age of Onset   Heart disease Mother    Hypertension Mother    Hypertension Father     Social History   Tobacco Use   Smoking status: Never   Smokeless tobacco: Never  Vaping Use   Vaping Use: Never used  Substance Use Topics    Alcohol use: No   Drug use: No    Allergies: No Known Allergies  Medications Prior to Admission  Medication Sig Dispense Refill Last Dose   famotidine (PEPCID) 40 MG tablet Take 40 mg by mouth daily.   06/11/2021   Prenatal MV-Min-FA-Omega-3 (PRENATAL GUMMIES/DHA & FA) 0.4-32.5 MG CHEW Chew 2 each by mouth at bedtime.   06/11/2021   sertraline (ZOLOFT) 50 MG tablet Take 100 mg by mouth at bedtime.   06/11/2021   acetaminophen (TYLENOL) 500 MG tablet Take 1,000 mg by mouth every 6 (six) hours as needed for mild pain, moderate pain or headache.       Review of Systems  Constitutional: Negative.  Negative for fatigue and fever.  HENT: Negative.    Respiratory: Negative.  Negative for shortness of breath.   Cardiovascular: Negative.  Negative for chest pain.  Gastrointestinal: Negative.  Negative for abdominal pain, constipation, diarrhea, nausea and vomiting.  Genitourinary:  Positive for vaginal discharge. Negative for dysuria.  Neurological:  Positive for headaches. Negative for dizziness.  Physical Exam  Blood pressure 116/90, pulse 85, temperature 98.8 F (37.1 C), temperature source Oral, resp. rate 18, height 5\' 6"  (1.676 m), weight 108.1 kg, SpO2 99 %, unknown if currently breastfeeding.  Patient Vitals for the past 24 hrs:  BP Temp Temp src Pulse Resp SpO2 Height Weight  06/12/21 1258 115/71 98.3 F (36.8 C) -- 83 18 99 % -- --  06/12/21 1020 116/90 -- -- 85 -- -- -- --  06/12/21 1004 123/66 98.8 F (37.1 C) Oral 84 18 99 % 5\' 6"  (1.676 m) 108.1 kg     Physical Exam Vitals and nursing note reviewed.  Constitutional:      General: She is not in acute distress.    Appearance: She is well-developed.  HENT:     Head: Normocephalic.  Eyes:     Pupils: Pupils are equal, round, and reactive to light.  Cardiovascular:     Rate and Rhythm: Normal rate and regular rhythm.     Heart sounds: Normal heart sounds.  Pulmonary:     Effort: Pulmonary effort is normal. No  respiratory distress.     Breath sounds: Normal breath sounds.  Abdominal:     General: Bowel sounds are normal. There is no distension.     Palpations: Abdomen is soft.     Tenderness: There is no abdominal tenderness.  Skin:    General: Skin is warm and dry.  Neurological:     Mental Status: She is alert and oriented to person, place, and time.  Psychiatric:        Mood and Affect: Mood normal.        Behavior: Behavior normal.        Thought Content: Thought content normal.        Judgment: Judgment normal.   Fetal Tracing:  Baseline: 140 Variability: moderate Accels: 15x15 Decels: none  Toco: none  Dilation: Closed Exam by:: C. Neil,CNM   MAU Course  Procedures Results for orders placed or performed during the hospital encounter of 06/12/21 (from the past 24 hour(s))  Amnisure rupture of membrane (rom)not at [facility]     Status: None   Collection Time: 06/12/21 11:06 AM  Result Value Ref Range   Amnisure ROM NEGATIVE   Wet prep, genital     Status: Abnormal   Collection Time: 06/12/21 11:06 AM  Result Value Ref Range   Yeast Wet Prep HPF POC NONE SEEN NONE SEEN   Trich, Wet Prep NONE SEEN NONE SEEN   Clue Cells Wet Prep HPF POC NONE SEEN NONE SEEN   WBC, Wet Prep HPF POC MODERATE (A) NONE SEEN   Sperm NONE SEEN   CBC     Status: Abnormal   Collection Time: 06/12/21 11:26 AM  Result Value Ref Range   WBC 11.2 (H) 4.0 - 10.5 K/uL   RBC 3.52 (L) 3.87 - 5.11 MIL/uL   Hemoglobin 10.8 (L) 12.0 - 15.0 g/dL   HCT 31.3 (L) 36.0 - 46.0 %   MCV 88.9 80.0 - 100.0 fL   MCH 30.7 26.0 - 34.0 pg   MCHC 34.5 30.0 - 36.0 g/dL   RDW 12.7 11.5 - 15.5 %   Platelets 270 150 - 400 K/uL   nRBC 0.0 0.0 - 0.2 %  Comprehensive metabolic panel     Status: Abnormal   Collection Time: 06/12/21 11:26 AM  Result Value Ref Range   Sodium 135 135 - 145 mmol/L   Potassium 3.7 3.5 - 5.1 mmol/L   Chloride 108 98 -  111 mmol/L   CO2 19 (L) 22 - 32 mmol/L   Glucose, Bld 83 70 - 99 mg/dL   BUN  7 6 - 20 mg/dL   Creatinine, Ser 0.48 0.44 - 1.00 mg/dL   Calcium 8.6 (L) 8.9 - 10.3 mg/dL   Total Protein 5.7 (L) 6.5 - 8.1 g/dL   Albumin 2.7 (L) 3.5 - 5.0 g/dL   AST 15 15 - 41 U/L   ALT 12 0 - 44 U/L   Alkaline Phosphatase 90 38 - 126 U/L   Total Bilirubin 0.4 0.3 - 1.2 mg/dL   GFR, Estimated >60 >60 mL/min   Anion gap 8 5 - 15  Urinalysis, Routine w reflex microscopic Urine, Clean Catch     Status: Abnormal   Collection Time: 06/12/21 12:04 PM  Result Value Ref Range   Color, Urine YELLOW YELLOW   APPearance CLOUDY (A) CLEAR   Specific Gravity, Urine 1.026 1.005 - 1.030   pH 6.0 5.0 - 8.0   Glucose, UA NEGATIVE NEGATIVE mg/dL   Hgb urine dipstick NEGATIVE NEGATIVE   Bilirubin Urine NEGATIVE NEGATIVE   Ketones, ur NEGATIVE NEGATIVE mg/dL   Protein, ur NEGATIVE NEGATIVE mg/dL   Nitrite NEGATIVE NEGATIVE   Leukocytes,Ua LARGE (A) NEGATIVE   RBC / HPF 21-50 0 - 5 RBC/hpf   WBC, UA 11-20 0 - 5 WBC/hpf   Bacteria, UA FEW (A) NONE SEEN   Squamous Epithelial / LPF 11-20 0 - 5   Mucus PRESENT    Non Squamous Epithelial 0-5 (A) NONE SEEN  Protein / creatinine ratio, urine     Status: None   Collection Time: 06/12/21 12:04 PM  Result Value Ref Range   Creatinine, Urine 174.47 mg/dL   Total Protein, Urine 19 mg/dL   Protein Creatinine Ratio 0.11 0.00 - 0.15 mg/mg[Cre]    MDM UA Initially elevated BP with headache, will evaluate for preeclampsia CBC, CMP, Protein/creat ratio Flexeril PO- improvement of headache  SSE: no pooling, copious thick white adherent discharge Amnisure Wet prep and gc/chlamydia  Assessment and Plan   1. Candidiasis of vagina during pregnancy   2. [redacted] weeks gestation of pregnancy   3. Encounter for suspected premature rupture of amniotic membranes, with rupture of membranes not found    -Discharge home in stable condition -Rx for terazol and flexeril sent to pharmacy -Third trimester and preeclampsia precautions discussed -Patient advised to  follow-up with OB as scheduled for prenatal care -Patient may return to MAU as needed or if her condition were to change or worsen   Wende Mott CNM 06/12/2021, 10:47 AM

## 2021-06-14 LAB — GC/CHLAMYDIA PROBE AMP (~~LOC~~) NOT AT ARMC
Chlamydia: NEGATIVE
Comment: NEGATIVE
Comment: NORMAL
Neisseria Gonorrhea: NEGATIVE

## 2021-06-15 ENCOUNTER — Other Ambulatory Visit: Payer: Self-pay | Admitting: Obstetrics & Gynecology

## 2021-06-15 ENCOUNTER — Other Ambulatory Visit: Payer: Self-pay

## 2021-06-15 DIAGNOSIS — R109 Unspecified abdominal pain: Secondary | ICD-10-CM

## 2021-06-16 ENCOUNTER — Ambulatory Visit
Admission: RE | Admit: 2021-06-16 | Discharge: 2021-06-16 | Disposition: A | Discharge status: Home / Self Care | Source: Ambulatory Visit | Attending: Obstetrics & Gynecology | Admitting: Obstetrics & Gynecology

## 2021-06-16 DIAGNOSIS — R109 Unspecified abdominal pain: Secondary | ICD-10-CM

## 2021-07-16 ENCOUNTER — Other Ambulatory Visit: Payer: Self-pay

## 2021-07-16 ENCOUNTER — Encounter (HOSPITAL_COMMUNITY): Payer: Self-pay | Admitting: Obstetrics

## 2021-07-16 ENCOUNTER — Inpatient Hospital Stay (HOSPITAL_COMMUNITY)
Admission: AD | Admit: 2021-07-16 | Discharge: 2021-07-17 | Disposition: A | Discharge status: Home / Self Care | Attending: Obstetrics | Admitting: Obstetrics

## 2021-07-16 DIAGNOSIS — R102 Pelvic and perineal pain: Secondary | ICD-10-CM | POA: Diagnosis not present

## 2021-07-16 DIAGNOSIS — Z3A36 36 weeks gestation of pregnancy: Secondary | ICD-10-CM

## 2021-07-16 DIAGNOSIS — O26893 Other specified pregnancy related conditions, third trimester: Secondary | ICD-10-CM | POA: Diagnosis not present

## 2021-07-16 MED ORDER — CYCLOBENZAPRINE HCL 5 MG PO TABS
5.0000 mg | ORAL_TABLET | Freq: Once | ORAL | Status: AC
  Administered 2021-07-16: 5 mg via ORAL
  Filled 2021-07-16: qty 1

## 2021-07-16 NOTE — MAU Note (Signed)
..  Morgan Chang is a 25 y.o. at [redacted]w[redacted]d here in MAU reporting: pelvic pressure denies contractions only cramping. +FM. Denies vaginal bleeding or leaking of fluid.  The pain is on the inside her pelvis and feels throbbing like, the pain is constant but worse when she moves.   Onset of complaint: 2 hours  Pain score: 9/10

## 2021-07-16 NOTE — MAU Provider Note (Signed)
Chief Complaint:  Pelvic Pain (Pelvic pressure)    HPI: Morgan Chang is a 25 y.o. G4P1021 at [redacted]w[redacted]d who presents to maternity admissions reporting ongoing pelvic pain and pressure that worsened tonight. She has used a support belt, tried Tylenol and heat without relief. She reports some mild cramping as well, but denies vaginal bleeding, leaking fluid or other concerning symptoms. She endorses active fetal movement. She is scheduled for a rLTCS on 11/18.   Pregnancy Course:   Past Medical History:  Diagnosis Date   Anxiety    Depression    Endometriosis    Fibroid    Frank breech presentation 10/29/2016   Migraines    Pregnancy induced hypertension    S/P cesarean section 10/29/2016   OB History  Gravida Para Term Preterm AB Living  4 1 1   2 1   SAB IAB Ectopic Multiple Live Births  2     0      # Outcome Date GA Lbr Len/2nd Weight Sex Delivery Anes PTL Lv  4 Current           3 Term 10/29/16 [redacted]w[redacted]d  3145 g M CS-LTranv Spinal  LIV  2 SAB 2016          1 SAB 2014            Obstetric Comments  C/s for breech   Past Surgical History:  Procedure Laterality Date   ADENOIDECTOMY  10/29/2007   BREAST REDUCTION SURGERY     CESAREAN SECTION N/A 10/29/2016   Procedure: CESAREAN SECTION;  Surgeon: Janyth Contes, MD;  Location: [place];  Service: Obstetrics;  Laterality: N/A;   LAPAROSCOPY  38/93/7342   NISSEN FUNDOPLICATION     RHINOPLASTY  03/22/2010   deviated septum   TONSILLECTOMY  2017   Family History  Problem Relation Age of Onset   Heart disease Mother    Hypertension Mother    Hypertension Father    Social History   Tobacco Use   Smoking status: Never   Smokeless tobacco: Never  Vaping Use   Vaping Use: Never used  Substance Use Topics   Alcohol use: No   Drug use: No   No Known Allergies No medications prior to admission.    I have reviewed patient's Past Medical Hx, Surgical Hx, Family Hx, Social Hx, medications and allergies.    ROS:  Review of Systems  Constitutional: Negative.   Respiratory: Negative.    Cardiovascular: Negative.   Gastrointestinal: Negative.   Genitourinary:  Positive for pelvic pain. Negative for dysuria, vaginal bleeding and vaginal discharge.  Musculoskeletal: Negative.   Neurological: Negative.    Physical Exam  Patient Vitals for the past 24 hrs:  BP Temp Temp src Pulse Resp  07/16/21 2340 122/84 -- -- 89 --  07/16/21 2240 133/84 98.1 F (36.7 C) Oral 90 20   Constitutional: Well-developed, well-nourished female in no acute distress.  Cardiovascular: normal rate Respiratory: normal effort GI: abd soft, non-tender, gravid  MS: extremities nontender, no edema, normal ROM Neurologic: alert and oriented x 4.  Pelvic: not indicated  Dilation: Closed Exam by:: Bridgette Habermann, CNM  FHT: Baseline 135 bpm, moderate variability, 15x15 accelerations present, no decelerations Toco: occasional ui   Labs: No results found for this or any previous visit (from the past 24 hour(s)).  Imaging:  No results found.  MAU Course: Orders Placed This Encounter  Procedures   Discharge patient   Meds ordered this encounter  Medications   cyclobenzaprine (FLEXERIL)  tablet 5 mg    MDM: Flexeril 5mg  PO Cervix closed NST reactive and reassuring   Assessment: 1. Pelvic pain affecting pregnancy in third trimester, antepartum   2. [redacted] weeks gestation of pregnancy     Plan: Discharge home in stable condition  Labor precautions and fetal kick counts Patient to keep appointment as scheduled on Wednesday Return to MAU as needed    Follow-up Information     Obgyn, [facility] Follow up.   Why: as scheduled on Wednesday. Return to MAU as needed. Contact information: [place] [address] [phone]                 Allergies as of 07/17/2021   No Known Allergies      Medication List     TAKE these medications    acetaminophen 500 MG tablet Commonly  known as: TYLENOL Take 1,000 mg by mouth every 6 (six) hours as needed for mild pain, moderate pain or headache.   cyclobenzaprine 5 MG tablet Commonly known as: FLEXERIL Take 1 tablet (5 mg total) by mouth 3 (three) times daily as needed for muscle spasms.   famotidine 40 MG tablet Commonly known as: PEPCID Take 40 mg by mouth daily.   insulin glargine 100 UNIT/ML injection Commonly known as: LANTUS Inject 30 Units into the skin daily.   Prenatal Gummies/DHA & FA 0.4-32.5 MG Chew Chew 2 each by mouth at bedtime.   sertraline 50 MG tablet Commonly known as: ZOLOFT Take 100 mg by mouth at bedtime.   terconazole 0.4 % vaginal cream Commonly known as: TERAZOL 7 Place 1 applicator vaginally at bedtime.         Maryagnes Amos, CNM 07/17/2021 5:00 AM

## 2021-07-23 ENCOUNTER — Inpatient Hospital Stay (EMERGENCY_DEPARTMENT_HOSPITAL)
Admission: AD | Admit: 2021-07-23 | Discharge: 2021-07-23 | Disposition: A | Discharge status: Home / Self Care | Source: Home / Self Care | Attending: Obstetrics and Gynecology | Admitting: Obstetrics and Gynecology

## 2021-07-23 ENCOUNTER — Other Ambulatory Visit: Payer: Self-pay

## 2021-07-23 ENCOUNTER — Other Ambulatory Visit: Payer: Self-pay | Admitting: Obstetrics

## 2021-07-23 ENCOUNTER — Encounter (HOSPITAL_COMMUNITY): Payer: Self-pay | Admitting: Obstetrics and Gynecology

## 2021-07-23 DIAGNOSIS — O134 Gestational [pregnancy-induced] hypertension without significant proteinuria, complicating childbirth: Secondary | ICD-10-CM | POA: Diagnosis not present

## 2021-07-23 DIAGNOSIS — D509 Iron deficiency anemia, unspecified: Secondary | ICD-10-CM | POA: Diagnosis not present

## 2021-07-23 DIAGNOSIS — Z3A37 37 weeks gestation of pregnancy: Secondary | ICD-10-CM

## 2021-07-23 DIAGNOSIS — O99214 Obesity complicating childbirth: Secondary | ICD-10-CM | POA: Diagnosis not present

## 2021-07-23 DIAGNOSIS — O26893 Other specified pregnancy related conditions, third trimester: Secondary | ICD-10-CM | POA: Insufficient documentation

## 2021-07-23 DIAGNOSIS — Z6791 Unspecified blood type, Rh negative: Secondary | ICD-10-CM | POA: Diagnosis not present

## 2021-07-23 DIAGNOSIS — R519 Headache, unspecified: Secondary | ICD-10-CM | POA: Insufficient documentation

## 2021-07-23 DIAGNOSIS — R1011 Right upper quadrant pain: Secondary | ICD-10-CM | POA: Insufficient documentation

## 2021-07-23 DIAGNOSIS — O9902 Anemia complicating childbirth: Secondary | ICD-10-CM | POA: Diagnosis not present

## 2021-07-23 DIAGNOSIS — O10013 Pre-existing essential hypertension complicating pregnancy, third trimester: Secondary | ICD-10-CM | POA: Insufficient documentation

## 2021-07-23 DIAGNOSIS — Z3689 Encounter for other specified antenatal screening: Secondary | ICD-10-CM | POA: Diagnosis not present

## 2021-07-23 DIAGNOSIS — O1413 Severe pre-eclampsia, third trimester: Secondary | ICD-10-CM

## 2021-07-23 DIAGNOSIS — O24424 Gestational diabetes mellitus in childbirth, insulin controlled: Secondary | ICD-10-CM | POA: Diagnosis not present

## 2021-07-23 DIAGNOSIS — Z20822 Contact with and (suspected) exposure to covid-19: Secondary | ICD-10-CM | POA: Insufficient documentation

## 2021-07-23 DIAGNOSIS — O99824 Streptococcus B carrier state complicating childbirth: Secondary | ICD-10-CM | POA: Diagnosis not present

## 2021-07-23 DIAGNOSIS — O99344 Other mental disorders complicating childbirth: Secondary | ICD-10-CM | POA: Diagnosis not present

## 2021-07-23 DIAGNOSIS — O34211 Maternal care for low transverse scar from previous cesarean delivery: Secondary | ICD-10-CM | POA: Diagnosis present

## 2021-07-23 DIAGNOSIS — F419 Anxiety disorder, unspecified: Secondary | ICD-10-CM | POA: Diagnosis not present

## 2021-07-23 DIAGNOSIS — Z302 Encounter for sterilization: Secondary | ICD-10-CM | POA: Diagnosis not present

## 2021-07-23 DIAGNOSIS — F32A Depression, unspecified: Secondary | ICD-10-CM | POA: Diagnosis not present

## 2021-07-23 LAB — URINALYSIS, ROUTINE W REFLEX MICROSCOPIC
Bilirubin Urine: NEGATIVE
Glucose, UA: NEGATIVE mg/dL
Hgb urine dipstick: NEGATIVE
Ketones, ur: NEGATIVE mg/dL
Nitrite: NEGATIVE
Protein, ur: 30 mg/dL — AB
RBC / HPF: >50 RBC/hpf — ABNORMAL HIGH (ref 0–5)
Specific Gravity, Urine: 1.027 (ref 1.005–1.030)
pH: 5 (ref 5.0–8.0)

## 2021-07-23 LAB — COMPREHENSIVE METABOLIC PANEL
ALT: 13 U/L (ref 0–44)
AST: 20 U/L (ref 15–41)
Albumin: 2.7 g/dL — ABNORMAL LOW (ref 3.5–5.0)
Alkaline Phosphatase: 177 U/L — ABNORMAL HIGH (ref 38–126)
Anion gap: 8 (ref 5–15)
BUN: 5 mg/dL — ABNORMAL LOW (ref 6–20)
CO2: 18 mmol/L — ABNORMAL LOW (ref 22–32)
Calcium: 8.6 mg/dL — ABNORMAL LOW (ref 8.9–10.3)
Chloride: 108 mmol/L (ref 98–111)
Creatinine, Ser: 0.53 mg/dL (ref 0.44–1.00)
GFR, Estimated: >60 mL/min (ref >60)
Glucose, Bld: 82 mg/dL (ref 70–99)
Potassium: 4 mmol/L (ref 3.5–5.1)
Sodium: 134 mmol/L — ABNORMAL LOW (ref 135–145)
Total Bilirubin: 0.3 mg/dL (ref 0.3–1.2)
Total Protein: 5.7 g/dL — ABNORMAL LOW (ref 6.5–8.1)

## 2021-07-23 LAB — CBC
HCT: 32.1 % — ABNORMAL LOW (ref 36.0–46.0)
Hemoglobin: 10.7 g/dL — ABNORMAL LOW (ref 12.0–15.0)
MCH: 28.2 pg (ref 26.0–34.0)
MCHC: 33.3 g/dL (ref 30.0–36.0)
MCV: 84.7 fL (ref 80.0–100.0)
Platelets: 335 10*3/uL (ref 150–400)
RBC: 3.79 MIL/uL — ABNORMAL LOW (ref 3.87–5.11)
RDW: 13.3 % (ref 11.5–15.5)
WBC: 10.7 10*3/uL — ABNORMAL HIGH (ref 4.0–10.5)
nRBC: 0 % (ref 0.0–0.2)

## 2021-07-23 LAB — RESP PANEL BY RT-PCR (FLU A&B, COVID) ARPGX2
Influenza A by PCR: NEGATIVE
Influenza B by PCR: NEGATIVE
SARS Coronavirus 2 by RT PCR: NEGATIVE

## 2021-07-23 LAB — PROTEIN / CREATININE RATIO, URINE
Creatinine, Urine: 203.54 mg/dL
Protein Creatinine Ratio: 0.14 mg/mg{Cre} (ref 0.00–0.15)
Total Protein, Urine: 28 mg/dL

## 2021-07-23 MED ORDER — DIPHENHYDRAMINE HCL 50 MG/ML IJ SOLN
25.0000 mg | Freq: Once | INTRAMUSCULAR | Status: AC
  Administered 2021-07-23: 25 mg via INTRAVENOUS
  Filled 2021-07-23: qty 1

## 2021-07-23 MED ORDER — METOCLOPRAMIDE HCL 5 MG/ML IJ SOLN
5.0000 mg | Freq: Once | INTRAMUSCULAR | Status: AC
  Administered 2021-07-23: 5 mg via INTRAVENOUS
  Filled 2021-07-23: qty 2

## 2021-07-23 MED ORDER — LACTATED RINGERS IV SOLN: INTRAVENOUS | Status: DC

## 2021-07-23 NOTE — MAU Note (Addendum)
..  Morgan Chang is a 25 y.o. at [redacted]w[redacted]d here in for MAU reporting: PIH s/s HA with a dull throb with no relief from tylenol and flexeril, with a pain rating of 8/10. Pt reports R upper ABD pain with a pain rating of 6/10. Pt also stated ABD is very tender to touch. Pt reported feeling bad starting a couple days ago. BP was elevated yesterday at home and in the office slightly 120/90s, and then overnight she woke up to check it after feeling worst. BP was 147/94 and recheck was 133/97. Pt was on the toilet and had two drops of VB, that has not continued. Pt reports DFM since 7:30 pm yesterday and no movement since she woke up. Pt denies LOF and abnormal discharge. PT has [place] labs done yesterday in the office.   There were no vitals filed for this visit.   FHT:147 SVE: 0/50 last weds  Labs ordered in triage: UA

## 2021-07-23 NOTE — MAU Provider Note (Addendum)
Chief Complaint:  Headache and Hypertension   Event Date/Time   First Provider Initiated Contact with Patient 07/23/21 0645     HPI: Morgan Chang is a 25 y.o. G4P1021 at 109w1dwho presents to maternity admissions reporting elevated blood pressure, headache (unrelieved by meds), RUQ abdominal pain.. She reports good fetal movement, denies LOF, vaginal itching/burning, urinary symptoms, dizziness, n/v, diarrhea, constipation or fever/chills.    Headache  This is a new problem. The current episode started today. The problem occurs constantly. The problem has been unchanged. The pain is located in the Right unilateral region. The pain does not radiate. The quality of the pain is described as aching. Associated symptoms include abdominal pain and a visual change. Pertinent negatives include no fever, muscle aches, nausea, photophobia or vomiting. Nothing aggravates the symptoms. She has tried acetaminophen (Flexeril) for the symptoms. The treatment provided no relief. Her past medical history is significant for hypertension.  Hypertension This is a recurrent problem. Associated symptoms include headaches. Pertinent negatives include no palpitations, peripheral edema or shortness of breath. There are no associated agents to hypertension. Past treatments include nothing.    RN Note: Morgan Chang KitchenMarland KitchenKessie Chang is a 25 y.o. at [redacted]w[redacted]d here in for MAU reporting: PIH s/s HA with a dull throb with no relief from tylenol and flexeril, with a pain rating of 8/10. Pt reports R upper ABD pain with a pain rating of 6/10. Pt also stated ABD is very tender to touch. Pt reported feeling bad starting a couple days. BP was elevated yesterday at home and in the office slightly 120s, and then over night she woke up to check it after feeling worst. BP was 147/94 and recheck was 133/97. Pt was on the toilet and had two drops of VB, that has not continue. Pt reports DFM since 7:30 pm yesterday and no movement since she woke up. Pt denies  LOF and abnormal discharge. PT has [place] labs done yesterday in the office.    There were no vitals filed for this visit.   FHT:147 SVE: 0/50 last weds  Labs ordered in triage: UA  Past Medical History: Past Medical History:  Diagnosis Date   Anxiety    Depression    Endometriosis    Fibroid    Frank breech presentation 10/29/2016   Migraines    Pregnancy induced hypertension    S/P cesarean section 10/29/2016    Past obstetric history: OB History  Gravida Para Term Preterm AB Living  4 1 1   2 1   SAB IAB Ectopic Multiple Live Births  2     0      # Outcome Date GA Lbr Len/2nd Weight Sex Delivery Anes PTL Lv  4 Current           3 Term 10/29/16 [redacted]w[redacted]d  3145 g M CS-LTranv Spinal  LIV  2 SAB 2016          1 SAB 2014            Obstetric Comments  C/s for breech    Past Surgical History: Past Surgical History:  Procedure Laterality Date   ADENOIDECTOMY  10/29/2007   BREAST REDUCTION SURGERY     CESAREAN SECTION N/A 10/29/2016   Procedure: CESAREAN SECTION;  Surgeon: Janyth Contes, MD;  Location: [place];  Service: Obstetrics;  Laterality: N/A;   LAPAROSCOPY  96/22/2979   NISSEN FUNDOPLICATION     RHINOPLASTY  03/22/2010   deviated septum   TONSILLECTOMY  2017    Family  History: Family History  Problem Relation Age of Onset   Heart disease Mother    Hypertension Mother    Hypertension Father     Social History: Social History   Tobacco Use   Smoking status: Never   Smokeless tobacco: Never  Vaping Use   Vaping Use: Never used  Substance Use Topics   Alcohol use: No   Drug use: No    Allergies: No Known Allergies  Meds:  Medications Prior to Admission  Medication Sig Dispense Refill Last Dose   acetaminophen (TYLENOL) 500 MG tablet Take 1,000 mg by mouth every 6 (six) hours as needed for mild pain, moderate pain or headache.   07/23/2021 at 0600   cyclobenzaprine (FLEXERIL) 5 MG tablet Take 1 tablet (5 mg total) by mouth 3 (three)  times daily as needed for muscle spasms. 20 tablet 0 07/23/2021 at 0200   famotidine (PEPCID) 40 MG tablet Take 40 mg by mouth daily.   07/22/2021   insulin glargine (LANTUS) 100 UNIT/ML injection Inject 30 Units into the skin daily.   07/22/2021   Prenatal MV-Min-FA-Omega-3 (PRENATAL GUMMIES/DHA & FA) 0.4-32.5 MG CHEW Chew 2 each by mouth at bedtime.   07/22/2021   sertraline (ZOLOFT) 50 MG tablet Take 100 mg by mouth at bedtime.   07/22/2021   terconazole (TERAZOL 7) 0.4 % vaginal cream Place 1 applicator vaginally at bedtime. 45 g 0     I have reviewed patient's Past Medical Hx, Surgical Hx, Family Hx, Social Hx, medications and allergies.   ROS:  Review of Systems  Constitutional:  Negative for fever.  Eyes:  Negative for photophobia.  Respiratory:  Negative for shortness of breath.   Cardiovascular:  Negative for palpitations.  Gastrointestinal:  Positive for abdominal pain. Negative for nausea and vomiting.  Neurological:  Positive for headaches.  Other systems negative  Physical Exam   Patient Vitals for the past 24 hrs:  BP Temp Temp src Pulse Resp SpO2 Height Weight  07/23/21 0901 111/76 -- -- 85 -- -- -- --  07/23/21 0847 (!) 117/92 -- -- 83 -- -- -- --  07/23/21 0831 (!) 130/103 -- -- 83 -- -- -- --  07/23/21 0816 (!) 142/95 -- -- 88 -- -- -- --  07/23/21 0801 122/80 -- -- 81 -- -- -- --  07/23/21 0746 (!) 107/91 -- -- 93 -- -- -- --  07/23/21 0730 (!) 118/104 -- -- 79 -- 100 % -- --  07/23/21 0705 136/77 -- -- 94 -- -- -- --  07/23/21 0700 126/75 -- -- 89 -- 98 % -- --  07/23/21 0656 130/84 -- -- 91 -- -- -- --  07/23/21 0635 (!) 134/93 98 F (36.7 C) Oral 96 20 99 % 5\' 6"  (1.676 m) 112.3 kg   Constitutional: Well-developed, well-nourished female in no acute distress.  Cardiovascular: normal rate and rhythm Respiratory: normal effort, clear to auscultation bilaterally GI: Abd soft, tender RUQ, gravid appropriate for gestational age.   No rebound or guarding. MS:  Extremities nontender, no edema, normal ROM Neurologic: Alert and oriented x 4. DTRs 2+ with no clonus GU: Neg CVAT.  PELVIC EXAM: Dilation: 1 Effacement (%): 50 Cervical Position: Posterior Station: -3 Presentation: Undeterminable Exam by:: n druebbisch rn  FHT:  Baseline 145 , moderate variability, accelerations present, no decelerations Contractions:  Irregular     Labs: Results for orders placed or performed during the hospital encounter of 07/23/21 (from the past 24 hour(s))  Urinalysis, Routine w reflex microscopic  Status: Abnormal   Collection Time: 07/23/21  6:45 AM  Result Value Ref Range   Color, Urine YELLOW YELLOW   APPearance CLOUDY (A) CLEAR   Specific Gravity, Urine 1.027 1.005 - 1.030   pH 5.0 5.0 - 8.0   Glucose, UA NEGATIVE NEGATIVE mg/dL   Hgb urine dipstick NEGATIVE NEGATIVE   Bilirubin Urine NEGATIVE NEGATIVE   Ketones, ur NEGATIVE NEGATIVE mg/dL   Protein, ur 30 (A) NEGATIVE mg/dL   Nitrite NEGATIVE NEGATIVE   Leukocytes,Ua TRACE (A) NEGATIVE   RBC / HPF >50 (H) 0 - 5 RBC/hpf   WBC, UA 11-20 0 - 5 WBC/hpf   Bacteria, UA FEW (A) NONE SEEN   Squamous Epithelial / LPF 6-10 0 - 5   Mucus PRESENT    Budding Yeast PRESENT    Ca Oxalate Crys, UA PRESENT   Protein / creatinine ratio, urine     Status: None   Collection Time: 07/23/21  6:45 AM  Result Value Ref Range   Creatinine, Urine 203.54 mg/dL   Total Protein, Urine 28 mg/dL   Protein Creatinine Ratio 0.14 0.00 - 0.15 mg/mg[Cre]  CBC     Status: Abnormal   Collection Time: 07/23/21  7:34 AM  Result Value Ref Range   WBC 10.7 (H) 4.0 - 10.5 K/uL   RBC 3.79 (L) 3.87 - 5.11 MIL/uL   Hemoglobin 10.7 (L) 12.0 - 15.0 g/dL   HCT 32.1 (L) 36.0 - 46.0 %   MCV 84.7 80.0 - 100.0 fL   MCH 28.2 26.0 - 34.0 pg   MCHC 33.3 30.0 - 36.0 g/dL   RDW 13.3 11.5 - 15.5 %   Platelets 335 150 - 400 K/uL   nRBC 0.0 0.0 - 0.2 %  Comprehensive metabolic panel     Status: Abnormal   Collection Time: 07/23/21  7:34  AM  Result Value Ref Range   Sodium 134 (L) 135 - 145 mmol/L   Potassium 4.0 3.5 - 5.1 mmol/L   Chloride 108 98 - 111 mmol/L   CO2 18 (L) 22 - 32 mmol/L   Glucose, Bld 82 70 - 99 mg/dL   BUN 5 (L) 6 - 20 mg/dL   Creatinine, Ser 0.53 0.44 - 1.00 mg/dL   Calcium 8.6 (L) 8.9 - 10.3 mg/dL   Total Protein 5.7 (L) 6.5 - 8.1 g/dL   Albumin 2.7 (L) 3.5 - 5.0 g/dL   AST 20 15 - 41 U/L   ALT 13 0 - 44 U/L   Alkaline Phosphatase 177 (H) 38 - 126 U/L   Total Bilirubin 0.3 0.3 - 1.2 mg/dL   GFR, Estimated >60 >60 mL/min   Anion gap 8 5 - 15     Imaging:  No results found.  MAU Course/MDM: I have ordered labs NST reviewed, reactive.  There is a lot of audible fetal movement, and after time in MAU patient states FM has returned to normal.  Treatments in MAU included IV fluids with Reglan and Benadryl for headache.  -preeclampsia evaluation without severe range BP in MAU on admission -elevated BP of 140s/100s in MAU, about half normal BP, no evidence of elevated BP in prenatal records -symptoms include: HA, RUQ pain, blurred vision -UA: cloudy/30PRO/trace leuks/+RBCs/few bacteria, sending urine for culture -CBC: H/H 10.7/32.1, platelets 335 -CMP: serum creatinine 0.53, AST/ALT 20/13 -PCr: 0.14 -EFM: reactive       -baseline: 130       -variability: moderate       -accels: present, 15x15       -  decels: absent       -TOCO: few ctx/irritability -after medication administration, pt reports HA now 6/10 down from 8/10 -consulted with Dr. Nelda Marseille who recommends based on patient symptoms and new onset elevated BP should move towards delivery and will discuss plan with Dr. Ronita Hipps -Derrell Lolling, CNM in MAU to see patient, will discharge to  home and monitor from office  Assessment: Single IUP at [redacted]w[redacted]d History of preeclampsia Intermittent hypertension Headache RUQ pain  Plan: Labs ordered HA cocktail ordered Report given to oncoming provider Discharge order entered by [facility]  provider  Hansel Feinstein CNM, MSN Certified Nurse-Midwife 07/23/2021 9:02 AM   Carolyne Fiscal, Gerrie Nordmann, NP  9:13 AM 07/23/2021   Appreciate Hospitalist evaluation of patient.   Patient known to [place] this pregnancy, she was seen in the office and evaluated for possible PEC developing past 2 days, normal LFT's and kidney function tests, BP normotensive in office. Headache x 3 days was believed to be migrainous as patient has a history of this.  GDM A2 and anxiety, hx of C/S, for planned repeat at 39 wks.    Reassuring findings in MAU evaluation, [place] wnl and reactive NST.  BP labile to mild range, normal PEC labs.   Discussed with patient option for moving towards delivery today vs waiting to discuss with her primary (Dr. Pamala Hurry) and scheduling repeat C/S in next few days with her. Patient would prefer to wait.  Given no severe range BP's, normal labs, and reassuring fetal testing, OK for patient to wait.   Will DC patient home with precautions.  F/U per office, patient will be called today with upcoming plan.   POC in consult w. Dr. Ronita Hipps.   Juliene Pina, MSN, CNM 07/23/2021, 9:31 AM

## 2021-07-23 NOTE — MAU Note (Signed)
Covid swab obtained and OR prep provided to patient prior to discharge. Pt verbalizes understanding to not remove blood bank bracelet in case she does have her c/s on Monday.

## 2021-07-24 LAB — CULTURE, OB URINE

## 2021-07-25 ENCOUNTER — Inpatient Hospital Stay (HOSPITAL_COMMUNITY)
Admission: AD | Admit: 2021-07-25 | Discharge: 2021-07-27 | DRG: 785 | Disposition: A | Discharge status: Home / Self Care | Attending: Obstetrics | Admitting: Obstetrics

## 2021-07-25 ENCOUNTER — Encounter (HOSPITAL_COMMUNITY)
Admission: AD | Disposition: A | Discharge status: Home / Self Care | Payer: Self-pay | Source: Home / Self Care | Attending: Obstetrics

## 2021-07-25 ENCOUNTER — Inpatient Hospital Stay (HOSPITAL_COMMUNITY): Admitting: Anesthesiology

## 2021-07-25 ENCOUNTER — Encounter (HOSPITAL_COMMUNITY): Payer: Self-pay | Admitting: Obstetrics

## 2021-07-25 ENCOUNTER — Other Ambulatory Visit: Payer: Self-pay

## 2021-07-25 DIAGNOSIS — O139 Gestational [pregnancy-induced] hypertension without significant proteinuria, unspecified trimester: Secondary | ICD-10-CM | POA: Diagnosis present

## 2021-07-25 DIAGNOSIS — F419 Anxiety disorder, unspecified: Secondary | ICD-10-CM | POA: Diagnosis present

## 2021-07-25 DIAGNOSIS — Z6791 Unspecified blood type, Rh negative: Secondary | ICD-10-CM

## 2021-07-25 DIAGNOSIS — O134 Gestational [pregnancy-induced] hypertension without significant proteinuria, complicating childbirth: Secondary | ICD-10-CM | POA: Diagnosis present

## 2021-07-25 DIAGNOSIS — F32A Depression, unspecified: Secondary | ICD-10-CM | POA: Diagnosis present

## 2021-07-25 DIAGNOSIS — Z302 Encounter for sterilization: Secondary | ICD-10-CM

## 2021-07-25 DIAGNOSIS — O99824 Streptococcus B carrier state complicating childbirth: Secondary | ICD-10-CM | POA: Diagnosis present

## 2021-07-25 DIAGNOSIS — O24424 Gestational diabetes mellitus in childbirth, insulin controlled: Secondary | ICD-10-CM | POA: Diagnosis present

## 2021-07-25 DIAGNOSIS — O34211 Maternal care for low transverse scar from previous cesarean delivery: Principal | ICD-10-CM | POA: Diagnosis present

## 2021-07-25 DIAGNOSIS — Z9079 Acquired absence of other genital organ(s): Secondary | ICD-10-CM

## 2021-07-25 DIAGNOSIS — Z98891 History of uterine scar from previous surgery: Secondary | ICD-10-CM

## 2021-07-25 DIAGNOSIS — Z3A37 37 weeks gestation of pregnancy: Secondary | ICD-10-CM | POA: Diagnosis not present

## 2021-07-25 DIAGNOSIS — O9902 Anemia complicating childbirth: Secondary | ICD-10-CM | POA: Diagnosis present

## 2021-07-25 DIAGNOSIS — D509 Iron deficiency anemia, unspecified: Secondary | ICD-10-CM | POA: Diagnosis present

## 2021-07-25 DIAGNOSIS — O99214 Obesity complicating childbirth: Secondary | ICD-10-CM | POA: Diagnosis present

## 2021-07-25 DIAGNOSIS — O26893 Other specified pregnancy related conditions, third trimester: Secondary | ICD-10-CM | POA: Diagnosis present

## 2021-07-25 DIAGNOSIS — O24419 Gestational diabetes mellitus in pregnancy, unspecified control: Secondary | ICD-10-CM

## 2021-07-25 DIAGNOSIS — O99344 Other mental disorders complicating childbirth: Secondary | ICD-10-CM | POA: Diagnosis present

## 2021-07-25 HISTORY — DX: Gestational diabetes mellitus in pregnancy, unspecified control: O24.419

## 2021-07-25 LAB — COMPREHENSIVE METABOLIC PANEL
ALT: 11 U/L (ref 0–44)
AST: 18 U/L (ref 15–41)
Albumin: 2.6 g/dL — ABNORMAL LOW (ref 3.5–5.0)
Alkaline Phosphatase: 151 U/L — ABNORMAL HIGH (ref 38–126)
Anion gap: 9 (ref 5–15)
BUN: 7 mg/dL (ref 6–20)
CO2: 18 mmol/L — ABNORMAL LOW (ref 22–32)
Calcium: 8.5 mg/dL — ABNORMAL LOW (ref 8.9–10.3)
Chloride: 109 mmol/L (ref 98–111)
Creatinine, Ser: 0.59 mg/dL (ref 0.44–1.00)
GFR, Estimated: >60 mL/min (ref >60)
Glucose, Bld: 69 mg/dL — ABNORMAL LOW (ref 70–99)
Potassium: 3.7 mmol/L (ref 3.5–5.1)
Sodium: 136 mmol/L (ref 135–145)
Total Bilirubin: 0.6 mg/dL (ref 0.3–1.2)
Total Protein: 5.5 g/dL — ABNORMAL LOW (ref 6.5–8.1)

## 2021-07-25 LAB — CBC
HCT: 29.8 % — ABNORMAL LOW (ref 36.0–46.0)
Hemoglobin: 9.9 g/dL — ABNORMAL LOW (ref 12.0–15.0)
MCH: 28.4 pg (ref 26.0–34.0)
MCHC: 33.2 g/dL (ref 30.0–36.0)
MCV: 85.4 fL (ref 80.0–100.0)
Platelets: 307 10*3/uL (ref 150–400)
RBC: 3.49 MIL/uL — ABNORMAL LOW (ref 3.87–5.11)
RDW: 13.4 % (ref 11.5–15.5)
WBC: 10.3 10*3/uL (ref 4.0–10.5)
nRBC: 0 % (ref 0.0–0.2)

## 2021-07-25 LAB — TYPE AND SCREEN
ABO/RH(D): O NEG
Antibody Screen: POSITIVE

## 2021-07-25 LAB — GLUCOSE, CAPILLARY
Glucose-Capillary: 63 mg/dL — ABNORMAL LOW (ref 70–99)
Glucose-Capillary: 114 mg/dL — ABNORMAL HIGH (ref 70–99)
Glucose-Capillary: 58 mg/dL — ABNORMAL LOW (ref 70–99)
Glucose-Capillary: 82 mg/dL (ref 70–99)

## 2021-07-25 LAB — URIC ACID: Uric Acid, Serum: 5.5 mg/dL (ref 2.5–7.1)

## 2021-07-25 SURGERY — Surgical Case
Anesthesia: Spinal | Wound class: Clean Contaminated

## 2021-07-25 MED ORDER — DEXTROSE 50 % IV SOLN
12.5000 g | Freq: Once | INTRAVENOUS | Status: AC
  Administered 2021-07-25: 12.5 g via INTRAVENOUS

## 2021-07-25 MED ORDER — DEXAMETHASONE SODIUM PHOSPHATE 4 MG/ML IJ SOLN
INTRAMUSCULAR | Status: DC | PRN
  Administered 2021-07-25: 4 mg via INTRAVENOUS

## 2021-07-25 MED ORDER — MORPHINE SULFATE (PF) 0.5 MG/ML IJ SOLN
INTRAMUSCULAR | Status: DC | PRN
  Administered 2021-07-25: 150 ug via INTRATHECAL

## 2021-07-25 MED ORDER — CEFAZOLIN SODIUM-DEXTROSE 2-3 GM-%(50ML) IV SOLR
INTRAVENOUS | Status: DC | PRN
  Administered 2021-07-25: 2 g via INTRAVENOUS

## 2021-07-25 MED ORDER — FAMOTIDINE 20 MG PO TABS
20.0000 mg | ORAL_TABLET | Freq: Two times a day (BID) | ORAL | Status: DC | PRN
  Administered 2021-07-26: 20 mg via ORAL
  Filled 2021-07-25: qty 1

## 2021-07-25 MED ORDER — NALBUPHINE HCL 10 MG/ML IJ SOLN: 5.0000 mg | Freq: Once | INTRAMUSCULAR | Status: DC | PRN

## 2021-07-25 MED ORDER — DIPHENHYDRAMINE HCL 25 MG PO CAPS: 25.0000 mg | ORAL_CAPSULE | Freq: Four times a day (QID) | ORAL | Status: DC | PRN

## 2021-07-25 MED ORDER — FENTANYL CITRATE (PF) 100 MCG/2ML IJ SOLN
INTRAMUSCULAR | Status: AC
  Filled 2021-07-25: qty 2

## 2021-07-25 MED ORDER — KETOROLAC TROMETHAMINE 30 MG/ML IJ SOLN
30.0000 mg | Freq: Once | INTRAMUSCULAR | Status: AC
  Administered 2021-07-25: 30 mg via INTRAVENOUS

## 2021-07-25 MED ORDER — LACTATED RINGERS IV SOLN: INTRAVENOUS | Status: DC

## 2021-07-25 MED ORDER — ONDANSETRON HCL 4 MG/2ML IJ SOLN
INTRAMUSCULAR | Status: AC
  Filled 2021-07-25: qty 2

## 2021-07-25 MED ORDER — KETOROLAC TROMETHAMINE 30 MG/ML IJ SOLN
INTRAMUSCULAR | Status: AC
  Filled 2021-07-25: qty 1

## 2021-07-25 MED ORDER — ZOLPIDEM TARTRATE 5 MG PO TABS: 5.0000 mg | ORAL_TABLET | Freq: Every evening | ORAL | Status: DC | PRN

## 2021-07-25 MED ORDER — TETANUS-DIPHTH-ACELL PERTUSSIS 5-2.5-18.5 LF-MCG/0.5 IM SUSY: 0.5000 mL | PREFILLED_SYRINGE | Freq: Once | INTRAMUSCULAR | Status: DC

## 2021-07-25 MED ORDER — FENTANYL CITRATE (PF) 100 MCG/2ML IJ SOLN
INTRAMUSCULAR | Status: DC | PRN
  Administered 2021-07-25: 100 ug via INTRAVENOUS

## 2021-07-25 MED ORDER — SODIUM CHLORIDE 0.9 % IR SOLN
Status: DC | PRN
  Administered 2021-07-25: 1000 mL

## 2021-07-25 MED ORDER — MEPERIDINE HCL 25 MG/ML IJ SOLN: 6.2500 mg | INTRAMUSCULAR | Status: DC | PRN

## 2021-07-25 MED ORDER — MIDAZOLAM HCL 2 MG/2ML IJ SOLN
INTRAMUSCULAR | Status: DC | PRN
  Administered 2021-07-25: 2 mg via INTRAVENOUS

## 2021-07-25 MED ORDER — KETOROLAC TROMETHAMINE 30 MG/ML IJ SOLN
30.0000 mg | Freq: Four times a day (QID) | INTRAMUSCULAR | Status: AC
  Administered 2021-07-26 (×2): 30 mg via INTRAVENOUS
  Filled 2021-07-25 (×4): qty 1

## 2021-07-25 MED ORDER — SODIUM CHLORIDE 0.9% FLUSH: 3.0000 mL | INTRAVENOUS | Status: DC | PRN

## 2021-07-25 MED ORDER — NALOXONE HCL 4 MG/10ML IJ SOLN
1.0000 ug/kg/h | INTRAVENOUS | Status: DC | PRN
  Filled 2021-07-25: qty 5

## 2021-07-25 MED ORDER — PROMETHAZINE HCL 25 MG/ML IJ SOLN: 6.2500 mg | INTRAMUSCULAR | Status: DC | PRN

## 2021-07-25 MED ORDER — SENNOSIDES-DOCUSATE SODIUM 8.6-50 MG PO TABS
2.0000 | ORAL_TABLET | ORAL | Status: DC
  Administered 2021-07-26: 2 via ORAL
  Filled 2021-07-25 (×2): qty 2

## 2021-07-25 MED ORDER — OXYCODONE HCL 5 MG PO TABS
5.0000 mg | ORAL_TABLET | ORAL | Status: DC | PRN
  Administered 2021-07-26 – 2021-07-27 (×6): 10 mg via ORAL
  Administered 2021-07-27: 5 mg via ORAL
  Filled 2021-07-25: qty 2
  Filled 2021-07-25: qty 1
  Filled 2021-07-25 (×5): qty 2

## 2021-07-25 MED ORDER — SERTRALINE HCL 100 MG PO TABS
100.0000 mg | ORAL_TABLET | Freq: Every evening | ORAL | Status: DC
  Administered 2021-07-25: 100 mg via ORAL
  Filled 2021-07-25 (×2): qty 1

## 2021-07-25 MED ORDER — DIPHENHYDRAMINE HCL 50 MG/ML IJ SOLN: 12.5000 mg | INTRAMUSCULAR | Status: DC | PRN

## 2021-07-25 MED ORDER — DEXTROSE 50 % IV SOLN
INTRAVENOUS | Status: AC
  Filled 2021-07-25: qty 50

## 2021-07-25 MED ORDER — OXYCODONE HCL 5 MG PO TABS: 5.0000 mg | ORAL_TABLET | Freq: Once | ORAL | Status: DC | PRN

## 2021-07-25 MED ORDER — BUPIVACAINE IN DEXTROSE 0.75-8.25 % IT SOLN
INTRATHECAL | Status: DC | PRN
  Administered 2021-07-25: 1.6 mL via INTRATHECAL

## 2021-07-25 MED ORDER — PRENATAL MULTIVITAMIN CH
1.0000 | ORAL_TABLET | Freq: Every day | ORAL | Status: DC
  Administered 2021-07-26: 1 via ORAL
  Filled 2021-07-25: qty 1

## 2021-07-25 MED ORDER — OXYCODONE HCL 5 MG/5ML PO SOLN: 5.0000 mg | Freq: Once | ORAL | Status: DC | PRN

## 2021-07-25 MED ORDER — ACETAMINOPHEN 10 MG/ML IV SOLN
INTRAVENOUS | Status: DC | PRN
  Administered 2021-07-25: 1000 mg via INTRAVENOUS

## 2021-07-25 MED ORDER — MIDAZOLAM HCL 2 MG/2ML IJ SOLN: INTRAMUSCULAR | Status: DC | PRN

## 2021-07-25 MED ORDER — SIMETHICONE 80 MG PO CHEW
80.0000 mg | CHEWABLE_TABLET | Freq: Three times a day (TID) | ORAL | Status: DC
  Administered 2021-07-26 (×3): 80 mg via ORAL
  Filled 2021-07-25 (×3): qty 1

## 2021-07-25 MED ORDER — ACETAMINOPHEN 500 MG PO TABS
1000.0000 mg | ORAL_TABLET | Freq: Four times a day (QID) | ORAL | Status: DC
  Administered 2021-07-26 – 2021-07-27 (×6): 1000 mg via ORAL
  Filled 2021-07-25 (×7): qty 2

## 2021-07-25 MED ORDER — PHENYLEPHRINE HCL-NACL 20-0.9 MG/250ML-% IV SOLN
INTRAVENOUS | Status: AC
  Filled 2021-07-25: qty 250

## 2021-07-25 MED ORDER — WITCH HAZEL-GLYCERIN EX PADS: 1.0000 "application " | MEDICATED_PAD | CUTANEOUS | Status: DC | PRN

## 2021-07-25 MED ORDER — NALOXONE HCL 0.4 MG/ML IJ SOLN: 0.4000 mg | INTRAMUSCULAR | Status: DC | PRN

## 2021-07-25 MED ORDER — FENTANYL CITRATE (PF) 100 MCG/2ML IJ SOLN
25.0000 ug | INTRAMUSCULAR | Status: DC | PRN
  Administered 2021-07-25: 25 ug via INTRAVENOUS

## 2021-07-25 MED ORDER — OXYTOCIN-SODIUM CHLORIDE 30-0.9 UT/500ML-% IV SOLN: 2.5000 [IU]/h | INTRAVENOUS | Status: AC

## 2021-07-25 MED ORDER — IBUPROFEN 600 MG PO TABS
600.0000 mg | ORAL_TABLET | Freq: Four times a day (QID) | ORAL | Status: DC
  Administered 2021-07-26 – 2021-07-27 (×4): 600 mg via ORAL
  Filled 2021-07-25 (×4): qty 1

## 2021-07-25 MED ORDER — DIBUCAINE (PERIANAL) 1 % EX OINT: 1.0000 "application " | TOPICAL_OINTMENT | CUTANEOUS | Status: DC | PRN

## 2021-07-25 MED ORDER — SCOPOLAMINE 1 MG/3DAYS TD PT72: 1.0000 | MEDICATED_PATCH | Freq: Once | TRANSDERMAL | Status: DC

## 2021-07-25 MED ORDER — COCONUT OIL OIL: 1.0000 "application " | TOPICAL_OIL | Status: DC | PRN

## 2021-07-25 MED ORDER — FENTANYL CITRATE (PF) 100 MCG/2ML IJ SOLN
INTRAMUSCULAR | Status: DC | PRN
  Administered 2021-07-25: 15 ug via INTRATHECAL

## 2021-07-25 MED ORDER — KETAMINE HCL 10 MG/ML IJ SOLN
INTRAMUSCULAR | Status: DC | PRN
  Administered 2021-07-25 (×2): 10 mg via INTRAVENOUS

## 2021-07-25 MED ORDER — MENTHOL 3 MG MT LOZG: 1.0000 | LOZENGE | OROMUCOSAL | Status: DC | PRN

## 2021-07-25 MED ORDER — MIDAZOLAM HCL 2 MG/2ML IJ SOLN
INTRAMUSCULAR | Status: AC
  Filled 2021-07-25: qty 2

## 2021-07-25 MED ORDER — SIMETHICONE 80 MG PO CHEW: 80.0000 mg | CHEWABLE_TABLET | ORAL | Status: DC | PRN

## 2021-07-25 MED ORDER — OXYTOCIN-SODIUM CHLORIDE 30-0.9 UT/500ML-% IV SOLN
INTRAVENOUS | Status: AC
  Filled 2021-07-25: qty 500

## 2021-07-25 MED ORDER — KETAMINE HCL 50 MG/5ML IJ SOSY
PREFILLED_SYRINGE | INTRAMUSCULAR | Status: AC
  Filled 2021-07-25: qty 5

## 2021-07-25 MED ORDER — OXYTOCIN-SODIUM CHLORIDE 30-0.9 UT/500ML-% IV SOLN
INTRAVENOUS | Status: DC | PRN
  Administered 2021-07-25: 30 [IU] via INTRAVENOUS

## 2021-07-25 MED ORDER — ONDANSETRON HCL 4 MG/2ML IJ SOLN
INTRAMUSCULAR | Status: DC | PRN
  Administered 2021-07-25: 4 mg via INTRAVENOUS

## 2021-07-25 MED ORDER — PHENYLEPHRINE HCL-NACL 20-0.9 MG/250ML-% IV SOLN
INTRAVENOUS | Status: DC | PRN
  Administered 2021-07-25: 60 ug/min via INTRAVENOUS

## 2021-07-25 MED ORDER — MORPHINE SULFATE (PF) 0.5 MG/ML IJ SOLN
INTRAMUSCULAR | Status: AC
  Filled 2021-07-25: qty 10

## 2021-07-25 MED ORDER — NALBUPHINE HCL 10 MG/ML IJ SOLN: 5.0000 mg | INTRAMUSCULAR | Status: DC | PRN

## 2021-07-25 MED ORDER — CEFAZOLIN SODIUM-DEXTROSE 2-4 GM/100ML-% IV SOLN
INTRAVENOUS | Status: AC
  Filled 2021-07-25: qty 100

## 2021-07-25 MED ORDER — ONDANSETRON HCL 4 MG/2ML IJ SOLN: 4.0000 mg | Freq: Three times a day (TID) | INTRAMUSCULAR | Status: DC | PRN

## 2021-07-25 MED ORDER — ACETAMINOPHEN 10 MG/ML IV SOLN: 1000.0000 mg | Freq: Once | INTRAVENOUS | Status: DC | PRN

## 2021-07-25 MED ORDER — DEXAMETHASONE SODIUM PHOSPHATE 10 MG/ML IJ SOLN
INTRAMUSCULAR | Status: AC
  Filled 2021-07-25: qty 1

## 2021-07-25 SURGICAL SUPPLY — 37 items
BENZOIN TINCTURE PRP APPL 2/3 (GAUZE/BANDAGES/DRESSINGS) ×2 IMPLANT
CHLORAPREP W/TINT 26ML (MISCELLANEOUS) ×2 IMPLANT
CLAMP CORD UMBIL (MISCELLANEOUS) IMPLANT
CLOSURE STERI STRIP 1/2 X4 (GAUZE/BANDAGES/DRESSINGS) ×2 IMPLANT
CLOTH BEACON ORANGE TIMEOUT ST (SAFETY) ×2 IMPLANT
DRSG OPSITE POSTOP 4X10 (GAUZE/BANDAGES/DRESSINGS) ×2 IMPLANT
ELECT REM PT RETURN 9FT ADLT (ELECTROSURGICAL) ×2
ELECTRODE REM PT RTRN 9FT ADLT (ELECTROSURGICAL) ×1 IMPLANT
EXTRACTOR VACUUM M CUP 4 TUBE (SUCTIONS) IMPLANT
GLOVE BIO SURGEON STRL SZ 6.5 (GLOVE) ×2 IMPLANT
GLOVE BIOGEL PI IND STRL 7.0 (GLOVE) ×2 IMPLANT
GLOVE BIOGEL PI INDICATOR 7.0 (GLOVE) ×2
GOWN STRL REUS W/TWL LRG LVL3 (GOWN DISPOSABLE) ×4 IMPLANT
KIT ABG SYR 3ML LUER SLIP (SYRINGE) IMPLANT
NEEDLE HYPO 22GX1.5 SAFETY (NEEDLE) IMPLANT
NEEDLE HYPO 25X5/8 SAFETYGLIDE (NEEDLE) IMPLANT
NS IRRIG 1000ML POUR BTL (IV SOLUTION) ×2 IMPLANT
PACK C SECTION WH (CUSTOM PROCEDURE TRAY) ×2 IMPLANT
PAD OB MATERNITY 4.3X12.25 (PERSONAL CARE ITEMS) ×2 IMPLANT
PENCIL SMOKE EVAC W/HOLSTER (ELECTROSURGICAL) ×2 IMPLANT
STRIP CLOSURE SKIN 1/2X4 (GAUZE/BANDAGES/DRESSINGS) IMPLANT
SUT MON AB 4-0 PS1 27 (SUTURE) ×2 IMPLANT
SUT PLAIN 0 NONE (SUTURE) IMPLANT
SUT PLAIN 2 0 (SUTURE) ×1
SUT PLAIN 2 0 XLH (SUTURE) ×2 IMPLANT
SUT PLAIN ABS 2-0 CT1 27XMFL (SUTURE) ×1 IMPLANT
SUT VIC AB 0 CT1 27 (SUTURE) ×1
SUT VIC AB 0 CT1 27XBRD ANBCTR (SUTURE) ×1 IMPLANT
SUT VIC AB 0 CT1 36 (SUTURE) ×4 IMPLANT
SUT VIC AB 0 CTX 36 (SUTURE) ×2
SUT VIC AB 0 CTX36XBRD ANBCTRL (SUTURE) ×2 IMPLANT
SUT VIC AB 2-0 CT1 27 (SUTURE) ×2
SUT VIC AB 2-0 CT1 TAPERPNT 27 (SUTURE) ×2 IMPLANT
SYR CONTROL 10ML LL (SYRINGE) IMPLANT
TOWEL OR 17X24 6PK STRL BLUE (TOWEL DISPOSABLE) ×2 IMPLANT
TRAY FOLEY W/BAG SLVR 14FR LF (SET/KITS/TRAYS/PACK) IMPLANT
WATER STERILE IRR 1000ML POUR (IV SOLUTION) ×2 IMPLANT

## 2021-07-25 NOTE — Anesthesia Postprocedure Evaluation (Signed)
Anesthesia Post Note  Patient: Morgan Chang  Procedure(s) Performed: Repeat CESAREAN SECTION     Patient location during evaluation: PACU Anesthesia Type: Spinal Level of consciousness: awake Pain management: pain level controlled Vital Signs Assessment: post-procedure vital signs reviewed and stable Respiratory status: spontaneous breathing, respiratory function stable and patient connected to nasal cannula oxygen Cardiovascular status: blood pressure returned to baseline and stable Postop Assessment: no headache, no backache and no apparent nausea or vomiting Anesthetic complications: no   No notable events documented.  Last Vitals:  Vitals:   07/25/21 2115 07/25/21 2135  BP: 114/76 117/68  Pulse: 88 89  Resp: (!) 23 (!) 22  Temp:  37 C  SpO2: 100% 98%    Last Pain:  Vitals:   07/25/21 2135  TempSrc: Oral  PainSc:    Pain Goal:    LLE Motor Response: Purposeful movement (07/25/21 2100) LLE Sensation: Tingling (07/25/21 2100) RLE Motor Response: Purposeful movement (07/25/21 2100) RLE Sensation: Tingling (07/25/21 2100)     Epidural/Spinal Function Cutaneous sensation: Able to Wiggle Toes (07/25/21 2135), Patient able to flex knees: Yes (07/25/21 2135), Patient able to lift hips off bed: No (07/25/21 2135), Back pain beyond tenderness at insertion site: No (07/25/21 2135), Progressively worsening motor and/or sensory loss: No (07/25/21 2135), Bowel and/or bladder incontinence post epidural: No (07/25/21 2100)  Shanique Aslinger P Aryon Nham

## 2021-07-25 NOTE — Anesthesia Preprocedure Evaluation (Addendum)
Anesthesia Evaluation  Patient identified by MRN, date of birth, ID band Patient awake    Reviewed: Allergy & Precautions, NPO status , Patient's Chart, lab work & pertinent test results  Airway Mallampati: II  TM Distance: >3 FB Neck ROM: Full    Dental no notable dental hx.    Pulmonary neg pulmonary ROS,    Pulmonary exam normal breath sounds clear to auscultation       Cardiovascular hypertension, Normal cardiovascular exam Rhythm:Regular Rate:Normal  PIH  ECG: NSR, rate 78   Neuro/Psych  Headaches, PSYCHIATRIC DISORDERS Anxiety Depression    GI/Hepatic negative GI ROS, Neg liver ROS,   Endo/Other  diabetes, Gestational, Insulin DependentMorbid obesity  Renal/GU negative Renal ROS     Musculoskeletal negative musculoskeletal ROS (+)   Abdominal (+) + obese,   Peds  Hematology  (+) anemia ,   Anesthesia Other Findings Previous Cesarean Section Gestational Diabetes  Reproductive/Obstetrics (+) Pregnancy                            Anesthesia Physical Anesthesia Plan  ASA: 3  Anesthesia Plan: Spinal   Post-op Pain Management:    Induction:   PONV Risk Score and Plan: 2 and Ondansetron, Dexamethasone and Treatment may vary due to age or medical condition  Airway Management Planned: Natural Airway  Additional Equipment:   Intra-op Plan:   Post-operative Plan:   Informed Consent: I have reviewed the patients History and Physical, chart, labs and discussed the procedure including the risks, benefits and alternatives for the proposed anesthesia with the patient or authorized representative who has indicated his/her understanding and acceptance.     Dental advisory given  Plan Discussed with: CRNA  Anesthesia Plan Comments:        Anesthesia Quick Evaluation

## 2021-07-25 NOTE — Progress Notes (Signed)
Hypoglycemic Event  CBG: 63  Treatment: D50 25 mL (12.5 gm)  Symptoms:  pt c/o feeling slightly dizzy  Follow-up CBG: Time:1751 CBG Result:114  Possible Reasons for Event: Inadequate meal intake  Comments/MD notified:MD notified 1732    Morgan Chang, [name] Pt c/o feeling slightly lightheaded

## 2021-07-25 NOTE — H&P (Signed)
Morgan Chang is a 25 y.o. [id number] at [redacted]w[redacted]d presenting for RCS. Pt notes no contractions. Good fetal movement, No vaginal bleeding, not leaking fluid.No HA, no vision change, no RUQ pain.   [facility] at [facility] since first trimester.   Prenatal Transfer Tool  Maternal Diabetes: Yes:  Diabetes Type:  Insulin/Medication controlled Genetic Screening: Normal Maternal Ultrasounds/Referrals: Normal Fetal Ultrasounds or other Referrals:  None Maternal Substance Abuse:  No Significant Maternal Medications:  None Significant Maternal Lab Results: Group B Strep positive     OB History     Gravida  4   Para  1   Term  1   Preterm      AB  2   Living  1      SAB  2   IAB      Ectopic      Multiple  0   Live Births           Obstetric Comments  C/s for breech        Past Medical History:  Diagnosis Date   Anxiety    Depression    Endometriosis    Fibroid    Frank breech presentation 10/29/2016   Gestational diabetes    Migraines    Pregnancy induced hypertension    S/P cesarean section 10/29/2016   Past Surgical History:  Procedure Laterality Date   ADENOIDECTOMY  10/29/2007   BREAST REDUCTION SURGERY     CESAREAN SECTION N/A 10/29/2016   Procedure: CESAREAN SECTION;  Surgeon: Janyth Contes, MD;  Location: [place];  Service: Obstetrics;  Laterality: N/A;   LAPAROSCOPY  81/77/1165   NISSEN FUNDOPLICATION     RHINOPLASTY  03/22/2010   deviated septum   TONSILLECTOMY  2017   Family History: family history includes Heart disease in her mother; Hypertension in her father and mother. Social History:  reports that she has never smoked. She has never used smokeless tobacco. She reports that she does not drink alcohol and does not use drugs.  Review of Systems - Negative except pelvic pain     Blood pressure 123/85, pulse 88, temperature 98.2 F (36.8 C), resp. rate 18, height 5\' 5"  (1.651 m), weight 113.4 kg, SpO2 99 %, unknown if  currently breastfeeding.  Physical Exam:  Gen: well appearing, no distress Back: no CVAT Abd: gravid, NT, no RUQ pain LE: trace edema, equal bilaterally, non-tender   Prenatal labs: ABO, Rh: --/--/PENDING (11/06 1645) Antibody: PENDING (11/06 1645) Rubella:  immune RPR:   NR HBsAg:    neg HIV:   neg GBS:   post 1 hr Glucola 138, abnl 3 hr  Genetic screening nl Panorama Anatomy US normal   Assessment/Plan: 25 y.o. [id number] at [redacted]w[redacted]d Gest htn, mild, no meds, will watch, move to delivery now RCS Desires tubal, risks, including regret d/w pt GDM, on Lantus, stable BS pre-op, will watch BS PP   Morgan Chang 07/25/2021, 6:07 PM

## 2021-07-25 NOTE — Anesthesia Procedure Notes (Signed)
Spinal  Patient location during procedure: OR Start time: 07/25/2021 6:30 PM End time: 07/25/2021 6:36 PM Reason for block: surgical anesthesia Staffing Performed: anesthesiologist  Anesthesiologist: Murvin Natal, MD Preanesthetic Checklist Completed: patient identified, IV checked, risks and benefits discussed, surgical consent, monitors and equipment checked, pre-op evaluation and timeout performed Spinal Block Patient position: sitting Prep: DuraPrep Patient monitoring: cardiac monitor, continuous pulse ox and blood pressure Approach: midline Location: L4-5 Injection technique: single-shot Needle Needle type: Pencan  Needle gauge: 24 G Needle length: 9 cm Assessment Sensory level: T10 Events: CSF return Additional Notes Functioning IV was confirmed and monitors were applied. Sterile prep and drape, including hand hygiene and sterile gloves were used. The patient was positioned and the spine was prepped. The skin was anesthetized with lidocaine.  Free flow of clear CSF was obtained on the second attempt prior to injecting local anesthetic into the CSF.  The spinal needle aspirated freely following injection.  The needle was carefully withdrawn.  The patient tolerated the procedure well.

## 2021-07-25 NOTE — Brief Op Note (Signed)
07/25/2021  8:13 PM  PATIENT:  Morgan Chang  24 y.o. female  PRE-OPERATIVE DIAGNOSIS:  Previous Cesarean Section, A2 Gestational Diabetes, gestational hypertension, desires sterilization  POST-OPERATIVE DIAGNOSIS:  Previous Cesarean Section, A2 Gestational Diabetes, gestational hypertension, desires sterilization  PROCEDURE:  Procedure(s) with comments: Repeat CESAREAN SECTION (N/A) -  Repeat C/S BTL  Low-transverse cesarean section, repeat cesarean section with 2 layer closure.  Left tubal ligation, right distal salpingectomy  SURGEON:  Surgeon(s) and Role:    * Aloha Gell, MD - Primary  PHYSICIAN ASSISTANT:   ASSISTANTS: Derrell Lolling, CNM  ANESTHESIA:   spinal  EBL: Per nursing notes  BLOOD ADMINISTERED:none  DRAINS: Urinary Catheter (Foley)   LOCAL MEDICATIONS USED:  NONE  SPECIMEN: Portion of left fallopian tube, distal right salpingectomy, placenta  DISPOSITION OF SPECIMEN:  PATHOLOGY  COUNTS:  YES  TOURNIQUET:  * No tourniquets in log *  DICTATION: .Note written in EPIC  PLAN OF CARE: Admit to inpatient   PATIENT DISPOSITION:  PACU - hemodynamically stable.   Delay start of Pharmacological VTE agent (>24hrs) due to surgical blood loss or risk of bleeding: yes

## 2021-07-25 NOTE — Op Note (Addendum)
07/25/2021  8:13 PM  PATIENT:  Morgan Chang  25 y.o. female  PRE-OPERATIVE DIAGNOSIS:  Previous Cesarean Section, A2 Gestational Diabetes, gestational hypertension, desires sterilization  POST-OPERATIVE DIAGNOSIS:  Previous Cesarean Section, A2 Gestational Diabetes, gestational hypertension, desires sterilization  PROCEDURE:  Procedure(s) with comments: Repeat CESAREAN SECTION (N/A) -  Repeat C/S BTL  Low-transverse cesarean section, repeat cesarean section with 2 layer closure.  Left tubal ligation, right distal salpingectomy  SURGEON:  Surgeon(s) and Role:    * Aloha Gell, MD - Primary  PHYSICIAN ASSISTANT:   ASSISTANTS: Derrell Lolling, CNM  ANESTHESIA:   spinal  EBL: Per nursing notes  BLOOD ADMINISTERED:none  DRAINS: Urinary Catheter (Foley)   LOCAL MEDICATIONS USED:  NONE  SPECIMEN: Portion of left fallopian tube, distal right salpingectomy, placenta  DISPOSITION OF SPECIMEN:  PATHOLOGY  COUNTS:  YES  TOURNIQUET:  * No tourniquets in log *  DICTATION: .Note written in EPIC  PLAN OF CARE: Admit to inpatient   PATIENT DISPOSITION:  PACU - hemodynamically stable.   Delay start of Pharmacological VTE agent (>24hrs) due to surgical blood loss or risk of bleeding: yes    Findings:  @BABYSEXEBC @ infant,  APGAR (1 MIN):   APGAR (5 MINS):   APGAR (10 MINS):   Normal uterus, tubes and ovaries, normal placenta. 3VC, clear amniotic fluid  EBL: Per nursing notes cc Antibiotics:   2g Ancef Complications: none  Indications: This is a 25 y.o. year-old, G2, P1 at [redacted]w[redacted]d admitted for repeat cesarean section, gestational diabetes, gestational hypertension, desires sterilization. Risks benefits and alternatives of the procedure were discussed with the patient who agreed to proceed  Procedure:  After informed consent was obtained the patient was taken to the operating room where spinal anesthesia was initiated.  She was prepped and draped in the normal sterile fashion  in dorsal supine position with a leftward tilt.  A foley catheter was in place.  A Pfannenstiel skin incision was made 2 cm above the pubic symphysis in the midline with the scalpel over the prior Pfannenstiel incision.  dissection was carried down with the Bovie cautery until the fascia was reached. The fascia was incised in the midline. The incision was extended laterally with the Mayo scissors. The inferior aspect of the fascial incision was grasped with the Coker clamps, elevated up and the underlying rectus muscles were dissected off sharply. The superior aspect of the fascial incision was grasped with the Coker clamps elevated up and the underlying rectus muscles were dissected off sharply.  The peritoneum was entered bluntly. The peritoneal incision was extended superiorly and inferiorly with good visualization of the bladder. The bladder blade was inserted and palpation was done to assess the fetal position and the location of the uterine vessels. The lower segment of the uterus was incised sharply with the scalpel and extended  bluntly in the cephalo-caudal fashion. The infant was grasped, brought to the incision,  rotated and the infant was delivered with fundal pressure. The nose and mouth were bulb suctioned. The cord was clamped and cut after 30 second delay.  We did not allow the full minute due to aggressive bleeding and the known anemia of mother.  The infant was handed off to the waiting pediatrician. The placenta was expressed. The uterus was exteriorized. The uterus was cleared of all clots and debris. The uterine incision was repaired with 0 Vicryl in a running locked fashion.  A second layer of the same suture was used in an imbricating fashion to obtain  excellent hemostasis.  The uterus was then returned to the abdomen, the gutters were cleared of all clots and debris. The uterine incision was reinspected and found to be hemostatic.  Patient's bowel and omentum continued to extrude through the  peritoneal opening.  A large malleable device was needed to help keep the bowel inside the pelvis.  The peritoneum was grasped and closed with 2-0 Vicryl in a running fashion. The cut muscle edges and the underside of the fascia were inspected and found to be hemostatic. The fascia was closed with 0 Vicryl in a single layer . The subcutaneous tissue was irrigated. Scarpa's layer was closed with a 2-0 plain gut suture.  At this point we recognized the patient desired tubal ligation.  Decision was reconfirmed with the patient who wished to proceed.  The sutures of the subcutaneous layer were cut.  The fascial sutures were cut and removed.  The peritoneal sutures were cut and removed.  The uterus was exteriorized and covered in a damp towel.  The left fallopian tube was grasped with a Babcock in the mid isthmic portion.  Plain gut ties were not immediately available and several minutes were spent while the circulating nurse went to find suture that was needed for the case.  The left  tube was visualized out to its fimbriated end. A Babcock clamp was used to elevated the mid-isthmic portion of the tube. A free tie of plan gut suture was used to tie off a 1 cm knuckle of tube. A second free tie was placed just below the first. A window was created in the mesosalpynx and the the knuckle of tube was transected.  Intention was to proceed with the identical procedure on the right side however marked adnexal varicosities were noted.  The uterus was replaced back into the pelvis to see if we could reduce the pelvic vasculature that was filling the broad ligament all the way up to the fallopian tubes.  I then attempted to massage the vessels but there was no window in the mesosalpinx for which to proceed with the traditional tubal ligation.  I then placed a Trameka Dorough clamp along the fimbriated edge and for about 2 cm proximal.  A distal salpingectomy was performed with a sharp scissors and a free tie x2 was placed.  Good hemostasis  was noted.  The uterus was then returned to the abdomen both tubal segments were reevaluated and found to be hemostatic.  Again a large malleable was used to retract the extruding bowel and omentum.  The peritoneum was grasped and closed with 2-0 Vicryl in a running fashion.  The fascia was closed with 0 Vicryl in a running locked fashion.  Scarpa's layer was closed with 2-0 plain gut.  The skin was closed with a 4-0 Monocryl in a single layer. The patient tolerated the procedure well. Sponge lap and needle counts were correct x3 and patient was taken to the recovery room in a stable condition.  Ala Dach 07/25/2021 8:15 PM    Of note.  Case scheduled for 6:15 PM start.  Patient in room at 618.  Case started at 652.  I was done with the procedure at 756

## 2021-07-25 NOTE — Transfer of Care (Signed)
Immediate Anesthesia Transfer of Care Note  Patient: Morgan Chang  Procedure(s) Performed: Repeat CESAREAN SECTION  Patient Location: PACU  Anesthesia Type:Spinal  Level of Consciousness: awake, alert , oriented and patient cooperative  Airway & Oxygen Therapy: Patient Spontanous Breathing  Post-op Assessment: Report given to RN, Post -op Vital signs reviewed and stable and Patient moving all extremities X 4  Post vital signs: Reviewed and stable  Last Vitals:  Vitals Value Taken Time  BP 116/71 07/25/21 2020  Temp    Pulse 78 07/25/21 2022  Resp 14 07/25/21 2022  SpO2 100 % 07/25/21 2022  Vitals shown include unvalidated device data.  Last Pain:  Vitals:   07/25/21 1618  PainSc: 0-No pain         Complications: No notable events documented.

## 2021-07-26 ENCOUNTER — Encounter (HOSPITAL_COMMUNITY): Payer: Self-pay | Admitting: Obstetrics

## 2021-07-26 DIAGNOSIS — O24419 Gestational diabetes mellitus in pregnancy, unspecified control: Secondary | ICD-10-CM

## 2021-07-26 DIAGNOSIS — Z9079 Acquired absence of other genital organ(s): Secondary | ICD-10-CM

## 2021-07-26 LAB — RPR: RPR Ser Ql: NONREACTIVE

## 2021-07-26 LAB — GLUCOSE, CAPILLARY
Glucose-Capillary: 109 mg/dL — ABNORMAL HIGH (ref 70–99)
Glucose-Capillary: 104 mg/dL — ABNORMAL HIGH (ref 70–99)

## 2021-07-26 LAB — CBC
HCT: 29 % — ABNORMAL LOW (ref 36.0–46.0)
Hemoglobin: 9.6 g/dL — ABNORMAL LOW (ref 12.0–15.0)
MCH: 28.2 pg (ref 26.0–34.0)
MCHC: 33.1 g/dL (ref 30.0–36.0)
MCV: 85.3 fL (ref 80.0–100.0)
Platelets: 279 10*3/uL (ref 150–400)
RBC: 3.4 MIL/uL — ABNORMAL LOW (ref 3.87–5.11)
RDW: 13.2 % (ref 11.5–15.5)
WBC: 15.9 10*3/uL — ABNORMAL HIGH (ref 4.0–10.5)
nRBC: 0 % (ref 0.0–0.2)

## 2021-07-26 MED ORDER — RHO D IMMUNE GLOBULIN 1500 UNIT/2ML IJ SOSY
300.0000 ug | PREFILLED_SYRINGE | Freq: Once | INTRAMUSCULAR | Status: AC
  Administered 2021-07-26: 300 ug via INTRAVENOUS
  Filled 2021-07-26: qty 2

## 2021-07-26 MED ORDER — VORTIOXETINE HBR 20 MG PO TABS
20.0000 mg | ORAL_TABLET | Freq: Every day | ORAL | Status: DC
  Administered 2021-07-26: 20 mg via ORAL
  Filled 2021-07-26 (×2): qty 1

## 2021-07-26 NOTE — Progress Notes (Signed)
POSTOPERATIVE DAY # 1 S/P Repeat LTCS for new onset GHTN, s/p right salpingectomy and left tubal ligation, baby girl "Alaina"    S:         Reports feeling well, no complaints, minimal discomfort  Denies HA, visual changes, RUQ/epigastric pain             Tolerating po intake / no nausea / no vomiting / no flatus / no BM  Denies dizziness, SOB, or CP             Bleeding is light             Pain controlled with Toradol and Tylenol             Up ad lib / ambulatory/ voiding QS without difficulty   Newborn breast and formula feeding  - reports hx of breast reduction 6 yrs ago, unable to breastfeed first baby   O:  VS: BP 103/67 (BP Location: Right Arm)   Pulse 63   Temp 98 F (36.7 C) (Oral)   Resp 17   Ht 5\' 5"  (1.651 m)   Wt 113.4 kg   SpO2 99%   Breastfeeding Unknown   BMI 41.60 kg/m  Patient Vitals for the past 24 hrs:  BP Temp Temp src Pulse Resp SpO2 Height Weight  07/26/21 0753 103/67 98 F (36.7 C) Oral 63 17 99 % -- --  07/26/21 0443 97/60 98 F (36.7 C) Oral 74 14 100 % -- --  07/26/21 0051 116/74 97.8 F (36.6 C) Oral 73 16 99 % -- --  07/25/21 2350 120/76 98.7 F (37.1 C) Oral 77 16 99 % -- --  07/25/21 2225 126/82 98 F (36.7 C) Oral 78 15 99 % -- --  07/25/21 2135 117/68 98.6 F (37 C) Oral 89 (!) 22 98 % -- --  07/25/21 2115 114/76 -- -- 88 (!) 23 100 % -- --  07/25/21 2100 (!) 149/84 98.2 F (36.8 C) -- 83 19 98 % -- --  07/25/21 2045 125/72 -- -- 79 13 98 % -- --  07/25/21 2030 111/85 -- -- 81 18 100 % -- --  07/25/21 2020 116/71 98.2 F (36.8 C) -- 83 18 100 % -- --  07/25/21 2015 (!) 118/106 -- -- -- -- -- -- --  07/25/21 1618 123/85 98.2 F (36.8 C) -- 88 18 99 % 5\' 5"  (1.651 m) 113.4 kg      LABS:               Recent Labs    07/25/21 1649 07/26/21 0508  WBC 10.3 15.9*  HGB 9.9* 9.6*  PLT 307 279               Bloodtype: --/--/O NEG (11/06 1645)  Rubella:          Immune            Flu: UTD Tdap: UTD Covid: ?                           I&O: Intake/Output      11/06 0701 11/07 0700 11/07 0701 11/08 0700   I.V. (mL/kg) 2427.4 (21.4)    Total Intake(mL/kg) 2427.4 (21.4)    Urine (mL/kg/hr) 2150    Blood 520    Total Output 2670    Net -242.7  Physical Exam:             Alert and Oriented X3  Lungs: Clear and unlabored  Heart: regular rate and rhythm / no murmurs  Abdomen: soft, non-tender, moderate gaseous distention, active bowel sounds in all quadrants              Fundus: firm, non-tender, U-E             Dressing: honeycomb with steri-strips c/d/i              Incision:  approximated with sutures /  no erythema / no ecchymosis / no drainage  Perineum: intact  Lochia: appropriate   Extremities: +1 LE edema, no calf pain or tenderness, negative Homans  A/P:    POD # 1 S/P Repeat LTCS             s/p right salpingectomy and left tubal ligation  Gestational HTN   - BPs WNL not on medication   - no neural s/s  RH Negative, baby O Positive   - Rhogam today, then d/c IV   A2GDM   - previously on Lantus in pregnancy   - elevated fasting this morning, but pt states she had apple juice overnight   - continue monitoring fasting CBGs and 2 hr PP   - If continued elevated readings, plan to start Metformin  Hx. Of Depression/anxiety   - on Zoloft 100mg  daily    - watch closely PP  Routine postoperative care              Ambulation encouraged; warm liquids to help promote bowel motility    Lactation support PRN  Desires early d/c home if stable with close BP f/u in 1 week  Lars Pinks, MSN, CNM [facility]

## 2021-07-26 NOTE — Progress Notes (Signed)
Late entry note: Notified by RN of patient's desire to switch from Zoloft to Trintellix 20mg  daily. Per RN, patient spoke with Dr. Pamala Hurry about plan and desires to start it tonight. Verbal orders given. Consulted pharmacist, Seth Bake, okay to transition from Zoloft to Trintellix due to long half life of Zoloft. Will have patient monitor for s/s of withdrawal. Will need close f/u with psych.   Lars Pinks, CNM

## 2021-07-26 NOTE — Social Work (Signed)
MOB was referred for history of depression/anxiety. * Referral screened out by Clinical Social Worker because none of the following criteria appear to apply: ~ History of anxiety/depression during this pregnancy, or of post-partum depression following prior delivery. ~ Diagnosis of anxiety and/or depression within last 3 years OR * MOB's symptoms currently being treated with medication and/or therapy. Per chart review, MOB symptoms are treated with Zoloft medication.   Please contact the Clinical Social Worker if needs arise, by [facility] request, or if MOB scores greater than 9/yes to question 10 on Edinburgh Postpartum Depression Screen.   Kathrin Greathouse, MSW, LCSW [facility] [place] [facility]  [phone] 07/26/2021  9:11 AM

## 2021-07-26 NOTE — Lactation Note (Signed)
This note was copied from a baby's chart. Lactation Consultation Note  Patient Name: Morgan Chang RRNHA'F Date: 07/26/2021 Reason for consult: Initial assessment;Breast reduction;Early term 37-38.6wks;1st time breastfeeding Age:27 hours  P2, Mother states she did not breastfeed her first child. Mother had breast reduction 6 years ago. She has been hand expressing and has some colostrum at home.  Baby was recently formula fed 10 ml. Set up DEBP and recommend mother post pump. Encouraged breastfeeding before offering formula to establish her milk supply.  Mother states she did not attempt last night due to being exhausted but plans to latch baby with cues. Referred mother to [email or web address].org for more information on breast reduction and breastfeeding. Feed on demand with cues.  Goal 8-12+ times per day after first 24 hrs.  Place baby STS if not cueing.  Mom made aware of O/P services, breastfeeding support groups, community resources, and our phone # for post-discharge questions.    Maternal Data Has patient been taught Hand Expression?: Yes Does the patient have breastfeeding experience prior to this delivery?: No  Feeding Mother's Current Feeding Choice: Breast Milk and Formula  Lactation Tools Discussed/Used Tools: Pump Breast pump type: Double-Electric Breast Pump Pump Education: Setup, frequency, and cleaning;Milk Storage Reason for Pumping: stimulation Pumping frequency:  (q 3 hours) Pumped volume:  (drops)  Interventions Interventions: Breast feeding basics reviewed;DEBP  Discharge Pump: Personal;DEBP  Consult Status Consult Status: Follow-up Date: 07/27/21 Follow-up type: In-patient    Vivianne Master [facility] 07/26/2021, 1:14 PM

## 2021-07-27 ENCOUNTER — Encounter: Payer: Self-pay | Admitting: Obstetrics and Gynecology

## 2021-07-27 LAB — GLUCOSE, CAPILLARY
Glucose-Capillary: 63 mg/dL — ABNORMAL LOW (ref 70–99)
Glucose-Capillary: 68 mg/dL — ABNORMAL LOW (ref 70–99)
Glucose-Capillary: 86 mg/dL (ref 70–99)

## 2021-07-27 LAB — KLEIHAUER-BETKE STAIN
# Vials RhIg: 2
Fetal Cells %: 1 %
Quantitation Fetal Hemoglobin: 0.0049 mL

## 2021-07-27 MED ORDER — IBUPROFEN 600 MG PO TABS: 600.0000 mg | ORAL_TABLET | Freq: Four times a day (QID) | ORAL | 0 refills | Status: DC

## 2021-07-27 MED ORDER — VORTIOXETINE HBR 20 MG PO TABS: 20.0000 mg | ORAL_TABLET | Freq: Every day | ORAL | 1 refills | Status: DC

## 2021-07-27 MED ORDER — SENNOSIDES-DOCUSATE SODIUM 8.6-50 MG PO TABS
2.0000 | ORAL_TABLET | ORAL | 0 refills | Status: DC
Start: 2021-07-27 — End: 2021-09-14

## 2021-07-27 MED ORDER — ACETAMINOPHEN 500 MG PO TABS: 1000.0000 mg | ORAL_TABLET | Freq: Four times a day (QID) | ORAL | 0 refills | Status: DC

## 2021-07-27 MED ORDER — FERROUS SULFATE 325 (65 FE) MG PO TBEC: 325.0000 mg | DELAYED_RELEASE_TABLET | Freq: Three times a day (TID) | ORAL | 0 refills | Status: DC

## 2021-07-27 MED ORDER — OXYCODONE HCL 5 MG PO TABS: 5.0000 mg | ORAL_TABLET | ORAL | 0 refills | Status: DC | PRN

## 2021-07-27 NOTE — Progress Notes (Signed)
POSTOPERATIVE DAY # 2 S/P Repeat LTCS at 37+ wks for new onset GHTN, s/p right salpingectomy and left tubal ligation, baby girl "Morgan Chang"    S:    no complaints. Some pain when moving. Pain meds help. No n/v/ eating well. Requesting discharge Girl, formula. Reports mastectomy hx and no success with breast feeding prior pregnancy     O:  VS: BP 108/83 (BP Location: Right Arm)   Pulse 71   Temp 98 F (36.7 C) (Oral)   Resp 16   Ht 5\' 5"  (1.651 m)   Wt 113.4 kg   SpO2 100%   Breastfeeding Unknown   BMI 41.60 kg/m  Patient Vitals for the past 24 hrs:  BP Temp Temp src Pulse Resp SpO2  07/27/21 0538 108/83 98 F (36.7 C) Oral 71 16 100 %  07/26/21 2228 (!) 107/58 97.8 F (36.6 C) Oral 72 16 100 %  07/26/21 1418 104/81 97.8 F (36.6 C) Oral 61 16 100 %  07/26/21 0753 103/67 98 F (36.7 C) Oral 63 17 99 %       LABS:               CBC Latest Ref Rng & Units 07/26/2021 07/25/2021 07/23/2021  WBC 4.0 - 10.5 K/uL 15.9(H) 10.3 10.7(H)  Hemoglobin 12.0 - 15.0 g/dL 9.6(L) 9.9(L) 10.7(L)  Hematocrit 36.0 - 46.0 % 29.0(L) 29.8(L) 32.1(L)  Platelets 150 - 400 K/uL 279 307 335                Bloodtype: --/--/O NEG (11/06 1645)  Rubella:          Immune            Flu: UTD Tdap: UTD Covid: ?                       Physical Exam:             Alert and Oriented X3  Lungs: Clear and unlabored  Heart: regular rate and rhythm / no murmurs  Abdomen: soft, non-tender, moderate gaseous distention, active bowel sounds in all quadrants              Fundus: firm, non-tender, U-E             Dressing: honeycomb with steri-strips c/d/i              Incision:  approximated with sutures /  no erythema / no ecchymosis / no drainage  Perineum: intact  Lochia: appropriate   Extremities: trace LE edema, no calf pain or tenderness, negative Homans  A/P:    POD # 2 S/P Repeat LTCS             s/p right salpingectomy and left tubal ligation  Gestational HTN   - BPs WNL not on medication   - no neural  s/s  RH Negative, baby O Positive   - Rhogam done   A2GDM   -Lantus in pregnancy, Nl BS now. No meds.                  - continue monitoring fasting CBGs and 2 hr PP   - If continued elevated readings, plan to start Metformin  Hx. Of Depression/anxiety   - switched to Trintellix per pt's choice after consulting pharmacist (Zoloft taper not needed)     - watch closely PP  Discharge home, postpartum and post-op care and call back parameters d/w pt  Needs BP check in  office with Dr Pamala Hurry on 11/14   Azucena Fallen MD [facility]

## 2021-07-27 NOTE — Progress Notes (Signed)
Hypoglycemic Event  CBG: 63  Treatment: 4 oz juice/soda  Symptoms: None  Follow-up CBG: Time: 0604 CBG Result:68  4 additional oz juice given  Follow up CBG 0629- 86  Possible Reasons for Event: fasting overnight  Comments/MD notified: Lars Pinks, CNM made aware.    Marcene Brawn

## 2021-07-27 NOTE — Discharge Summary (Signed)
Postpartum Discharge Summary    Patient Name: Morgan Chang DOB: [birth year] MRN: [id number]  Date of admission: 07/25/2021 Delivery date:07/25/2021  Delivering provider: Aloha Gell  Date of discharge: 07/27/2021  Admitting diagnosis: Gestational hypertension [O13.9] Intrauterine pregnancy: [redacted]w[redacted]d    Secondary diagnosis:  Principal Problem:   Postpartum care following cesarean delivery 11/6 Active Problems:   Previous cesarean section   Gestational hypertension- no medication   Maternal anemia, with delivery - IDA   GDM, class A2   Depression (was on Trintellix for 3 yrs, switched to Zoloft 107min pregnancy and switched back to Trintellix 20 mg postpartum per pt's request    Status post Repeat c-section and right salpingectomy; left tubal ligation     Discharge diagnosis: Term Pregnancy Delivered, Gestational Hypertension, and GDM A2                                           Post partum procedures: none Augmentation: N/A Complications: None  Hospital course: Sceduled C/S  at 37.3 weeks for Gest HTN. Pt was not on medication for GHTN. A2GDM, on Lantus but normal BS postpartum, no meds Pain well controlled, Mild anemia (anemia of pregnancy) and tolerating iron supplement       Newborn Data: Birth date:07/25/2021  Birth time:7:03 PM  Gender:Female  Living status:Living  Apgars:8 ,9  Weight:3370 g     Magnesium Sulfate received: No BMZ received: No Rhophylac:Yes MMR:N/A T-DaP:Given prenatally Flu: Yes prenatally Transfusion:No  Physical exam  Vitals:   07/26/21 0753 07/26/21 1418 07/26/21 2228 07/27/21 0538  BP: 103/67 104/81 (!) 107/58 108/83  Pulse: 63 61 72 71  Resp: 17 16 16 16   Temp: 98 F (36.7 C) 97.8 F (36.6 C) 97.8 F (36.6 C) 98 F (36.7 C)  TempSrc: Oral Oral Oral Oral  SpO2: 99% 100% 100% 100%  Weight:      Height:       General: alert, cooperative, and no distress Lochia: appropriate Uterine Fundus: firm Incision: Healing well with no  significant drainage DVT Evaluation: No evidence of DVT seen on physical exam.  Labs: CBC Latest Ref Rng & Units 07/26/2021 07/25/2021 07/23/2021  WBC 4.0 - 10.5 K/uL 15.9(H) 10.3 10.7(H)  Hemoglobin 12.0 - 15.0 g/dL 9.6(L) 9.9(L) 10.7(L)  Hematocrit 36.0 - 46.0 % 29.0(L) 29.8(L) 32.1(L)  Platelets 150 - 400 K/uL 279 307 335    CMP Latest Ref Rng & Units 07/25/2021  Glucose 70 - 99 mg/dL 69(L)  BUN 6 - 20 mg/dL 7  Creatinine 0.44 - 1.00 mg/dL 0.59  Sodium 135 - 145 mmol/L 136  Potassium 3.5 - 5.1 mmol/L 3.7  Chloride 98 - 111 mmol/L 109  CO2 22 - 32 mmol/L 18(L)  Calcium 8.9 - 10.3 mg/dL 8.5(L)  Total Protein 6.5 - 8.1 g/dL 5.5(L)  Total Bilirubin 0.3 - 1.2 mg/dL 0.6  Alkaline Phos 38 - 126 U/L 151(H)  AST 15 - 41 U/L 18  ALT 0 - 44 U/L 11   CBG (last 3)  Recent Labs    07/27/21 0542 07/27/21 0604 07/27/21 0629  GLUCAP 63* 68* 86    Edinburgh Score: Edinburgh Postnatal Depression Scale Screening Tool 07/26/2021  I have been able to laugh and see the funny side of things. 0  I have looked forward with enjoyment to things. 1  I have blamed myself unnecessarily when things went wrong.  2  I have been anxious or worried for no good reason. 3  I have felt scared or panicky for no good reason. 3  Things have been getting on top of me. 2  I have been so unhappy that I have had difficulty sleeping. 0  I have felt sad or miserable. 2  I have been so unhappy that I have been crying. 1  The thought of harming myself has occurred to me. 0  Edinburgh Postnatal Depression Scale Total 14      After visit meds:  Allergies as of 07/27/2021   No Known Allergies      Medication List     STOP taking these medications    cyclobenzaprine 5 MG tablet Commonly known as: FLEXERIL   famotidine 20 MG tablet Commonly known as: PEPCID   Lantus SoloStar 100 UNIT/ML Solostar Pen Generic drug: insulin glargine   sertraline 100 MG tablet Commonly known as: ZOLOFT   terconazole 0.4 %  vaginal cream Commonly known as: TERAZOL 7   zolpidem 5 MG tablet Commonly known as: AMBIEN       TAKE these medications    acetaminophen 500 MG tablet Commonly known as: TYLENOL Take 2 tablets (1,000 mg total) by mouth every 6 (six) hours. What changed:  when to take this reasons to take this   ferrous sulfate 325 (65 FE) MG EC tablet Take 1 tablet (325 mg total) by mouth 3 (three) times daily with meals.   ibuprofen 600 MG tablet Commonly known as: ADVIL Take 1 tablet (600 mg total) by mouth every 6 (six) hours.   oxyCODONE 5 MG immediate release tablet Commonly known as: Oxy IR/ROXICODONE Take 1-2 tablets (5-10 mg total) by mouth every 4 (four) hours as needed for moderate pain.   Prenatal Gummies/DHA & FA 0.4-32.5 MG Chew Chew 2 each by mouth at bedtime.   senna-docusate 8.6-50 MG tablet Commonly known as: Senokot-S Take 2 tablets by mouth daily.   vortioxetine HBr 20 MG Tabs tablet Commonly known as: TRINTELLIX Take 1 tablet (20 mg total) by mouth daily at 6 PM.               Discharge Care Instructions  (From admission, onward)           Start     Ordered   07/27/21 0000  Change dressing (specify)       Comments: Remove honeycomb dressing on 07/31/21   07/27/21 1126             Discharge home in stable condition Infant Feeding: Bottle Infant Disposition:home with mother Discharge instruction: per After Visit Summary and Postpartum booklet. Activity: Advance as tolerated. Pelvic rest for 6 weeks.  Diet: routine diet Anticipated Birth Control: BTL done PP at C/section  Postpartum Appointment: on 08/02/21 with Dr Pamala Hurry, please call office to schedule for BP check Future Appointments:6 weeks PP check  Post-op and PP care d/w pt and call parameters reviewed    07/27/2021 Elveria Royals, MD

## 2021-07-27 NOTE — Social Work (Signed)
CSW received consult for [place] 14.  CSW met with MOB to offer support and complete assessment.    CSW met with MOB at bedside and introduced CSW role. CSW observed MOB sitting up in the bed and FOB sitting in the recliner holding the infant. MOB presented calm and welcomed CSW to complete the assessment with FOB present. CSW inquired how MOB has felt since giving birth. MOB expressed feeling "relief. MOB shared throughout the pregnancy she was in a lot of physical pain and suffered a sprained ankle. MOB shared emotionally she felt "alone and isolated" even though she had support from her spouse and family. 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 MOB shared she answered the [place] based on her emotions throughout the pregnancy not just the past 7 days. MOB shared she is feeling emotionally better since the L&D. CSW inquired about MOB mental health diagnosis. MOB shared she has a history of anxiety and depression. She was diagnosed with anxiety in middle school and depression in college. MOB shared she takes medication to treatment and as of today her medication switched from Zoloft to Trintellix. MOB shared she will continue taking the medication postpartum. MOB shared saw a therapist in the past however she felt the therapist was not a good fit. MOB shared she is open to therapy in the future and receptive to resources. CSW inquired if MOB experienced PPD. MOB reported she experienced the baby blues but not PPD. CSW provided education regarding the baby blues period vs. perinatal mood disorders, discussed treatment and gave resources for mental health. CSW recommends self-evaluation during the postpartum time period using the New Mom Checklist from Postpartum Progress and encouraged MOB to contact a medical professional if symptoms are noted at any time. MOB shared she feels comfortable reaching out to her doctor if concerns arise. MOB denied thoughts of  harm to self and others. CSW inquired about MOB supports. MOB identified her spouse, mom and close family as supports.    CSW provided review of Sudden Infant Death Syndrome (SIDS) precautions. MOB shared she has essential items for the infant including a bassinet where the infant will sleep. MOB shared has chosen [facility] for infant's follow up care. CSW assessed MOB for additional needs. MOB reported no further needs.   CSW identifies no further need for intervention and no barriers to discharge at this time.  Terrilynn Postell, MSW, LCSW [facility]  Clinical Social Worker  [phone] 07/27/2021  12:39 PM  

## 2021-07-29 LAB — RH IG WORKUP (INCLUDES ABO/RH)
Fetal Screen: POSITIVE
Gestational Age(Wks): 37
Unit division: 0
Unit division: 0
Weak D: NEGATIVE

## 2021-07-29 LAB — SURGICAL PATHOLOGY

## 2021-08-04 ENCOUNTER — Telehealth (HOSPITAL_COMMUNITY): Payer: Self-pay | Admitting: Lactation Services

## 2021-08-04 NOTE — Telephone Encounter (Signed)
LC returned the phone call/ this  mom called - with Questions due to pumping with history of breast reduction  and flange being uncomfortable.  Mom mentioned she has #24 F and #28 F and the #24 F is painful and the #28 F is to big. Able to hand express well .  Per mom has a DEBP -and has checked the sizes of the flanges for purchase online ( and #27 F are available). The most pumped off is 2- 1l2 ozs.  LC reassured mom that is excellent for a breast reduction.  Mom mentioned she has been only pumping one breast at a time , due to being sure why the other side of the DEBP doesn't pump. LC encouraged mom to check the membrane and make sure its flat or connected well .  Mom mentioned the baby is not latching / and old receiving bottles up to 70 ml. > 5-6 wets a day and > 2-3 stools ( yellow brown). Baby is gaining well / 52 days old.   LC recommended - moist heat to the breast 7-10 mins , hand express, pump both breast for 15 -20 mins . Save milk for the next feeding.  Use a dab of coconut oil on the nipple / areola ( soften the nipple / areola complex and try the #24 F and if comfortable use it .  If not order the #27 F.  LC encouraged mom to call back with Questions.

## 2021-08-09 ENCOUNTER — Telehealth (HOSPITAL_COMMUNITY): Payer: Self-pay | Admitting: *Deleted

## 2021-08-09 NOTE — Telephone Encounter (Signed)
Phone voicemail message left to return nurse call. Dr. Pamala Hurry faxed hospital EPDS score of 14.  Odis Hollingshead, RN 08-09-2021 at 2:10pm

## 2021-09-14 ENCOUNTER — Encounter: Payer: Self-pay | Admitting: Physician Assistant

## 2021-09-14 ENCOUNTER — Ambulatory Visit (INDEPENDENT_AMBULATORY_CARE_PROVIDER_SITE_OTHER): Admitting: Physician Assistant

## 2021-09-14 ENCOUNTER — Other Ambulatory Visit: Payer: Self-pay

## 2021-09-14 VITALS — BP 122/70 | HR 90 | Temp 98.0°F | Ht 65.0 in | Wt 232.8 lb

## 2021-09-14 DIAGNOSIS — D649 Anemia, unspecified: Secondary | ICD-10-CM

## 2021-09-14 DIAGNOSIS — N12 Tubulo-interstitial nephritis, not specified as acute or chronic: Secondary | ICD-10-CM | POA: Insufficient documentation

## 2021-09-14 DIAGNOSIS — Z8759 Personal history of other complications of pregnancy, childbirth and the puerperium: Secondary | ICD-10-CM | POA: Insufficient documentation

## 2021-09-14 DIAGNOSIS — F902 Attention-deficit hyperactivity disorder, combined type: Secondary | ICD-10-CM

## 2021-09-14 DIAGNOSIS — F331 Major depressive disorder, recurrent, moderate: Secondary | ICD-10-CM | POA: Diagnosis not present

## 2021-09-14 DIAGNOSIS — F411 Generalized anxiety disorder: Secondary | ICD-10-CM

## 2021-09-14 DIAGNOSIS — G479 Sleep disorder, unspecified: Secondary | ICD-10-CM | POA: Diagnosis not present

## 2021-09-14 DIAGNOSIS — O24414 Gestational diabetes mellitus in pregnancy, insulin controlled: Secondary | ICD-10-CM

## 2021-09-14 HISTORY — DX: Anemia, unspecified: D64.9

## 2021-09-14 MED ORDER — AMPHETAMINE-DEXTROAMPHET ER 30 MG PO CP24: 30.0000 mg | ORAL_CAPSULE | Freq: Every day | ORAL | 0 refills | Status: DC

## 2021-09-14 MED ORDER — MIRTAZAPINE 15 MG PO TABS: 15.0000 mg | ORAL_TABLET | Freq: Every day | ORAL | 0 refills | Status: DC

## 2021-09-14 NOTE — Progress Notes (Signed)
Morgan Chang is a 25 y.o. female here to establish care and medication refill.  History of Present Illness:   Chief Complaint  Patient presents with   ADHD    Pt had baby 11/6 and wanting to get back on her ADHD meds (Vyvanse 75mg ) pt got TDAP and flu vaccine when pregnant. Pt starting new job and needs form completed that she is ok to work with children   ADHD Currently, Morgan Chang is interested in restating medication for her ADHD now that she has had her second child. States she was initially dx in 2015, but didn't start Vyvanse 70 mg daily until 2018 following the birth of her son. 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 She has expressed that she is interested in trialing a different medication if possible as Vyvanse is quite expensive. Her records are available from prior PCP in epic confirming diagnosis and medication.  Hx of Gestational Diabetes During this past pregnancy, Morgan Chang's A1c was in the range of diabetes and was at the time prescribed Lantus 30 units injection daily. She was compliant with this medication and experienced no adverse effects throughout her second pregnancy. According to Morgan Chang, she stopped this medication after birth and was set to follow up on the issue at her 6 week check up. Following her checkup her A1c was normal, resulting as 5.   At this time, Morgan Chang states she is trying to get her weight back down following the birth of her second child, but would like to know the best diet to participate in. She has already gotten a gym membership but wanted to make sure her progress wouldn't be held up by eating the wrong things. She is not breast feeding at this time and is open to trialing a medication if deemed necessary in the future.   Hx of Anemia According to pt, she was unaware of this issue until the end of her pregnancy. Despite this she was taking an iron  supplement shortly per her OBGYN's recommendation but shortly transitioned into only taking a calcium supplement. Denies any symptoms at this time and is managing well.   Hx of Gestational HTN Pt expresses this has been an issue toward the third trimester of her pregnancies but would resolved shortly after birth. Reports she has never taken medication for this issue, instead having her BP closely monitored. Denies excessive caffeine intake, stimulant usage, excessive alcohol intake, or increase in salt consumption. She is managing well.   BP Readings from Last 3 Encounters:  09/14/21 122/70  07/27/21 108/83  07/23/21 113/78     Depression/Anxiety At this time pt is compliant with taking Wellbutrin SR 150 mg twice daily and remeron 15 mg daily as needed for sleep, about 3 times a week. Although she has recently started use of Wellbutrin SR, she is tolerating well. Denies SI/HI.   Morgan Chang was previously taking trintellix 20 mg daily which she found to be very beneficial for sleep, but due to high cost she had to trial a different medication. In the past she has also trialed Morgan Chang and Morgan Chang but both provided her with no relief.   Health Maintenance: Immunizations -- Covid- UTD Influenza- UTD Tdap- UTD per patient report Colonoscopy -- N/A Mammogram -- N/A PAP -- UTD; postpartum Bone Density -- N/A Diet -- Eats all food groups Sleep habits -- Normal schedule due to medication Exercise -- None  currently Weight -- Stable Wt Readings from Last 10 Encounters:  09/14/21 232 lb 12.8 oz (105.6 kg)  07/25/21 250 lb (113.4 kg)  07/23/21 247 lb 9.6 oz (112.3 kg)  06/12/21 238 lb 4.8 oz (108.1 kg)  02/17/21 220 lb (99.8 kg)  10/29/16 216 lb 0.6 oz (98 kg)  10/26/16 210 lb (95.3 kg)  10/12/16 202 lb (91.6 kg)    Mood -- Stable Alcohol -- No reported use Tobacco -- Never  Depression screen PHQ 2/9 04/14/2021  Decreased Interest 0  Down, Depressed, Hopeless 0  PHQ - 2 Score 0    No  flowsheet data found.   Other providers/specialists: Patient Care Team: Inda Coke, [place] as PCP - General (Physician Assistant)   Past Medical History:  Diagnosis Date   Anxiety    Depression    Endometriosis    Fibroid    Pilar Plate breech presentation 10/29/2016   Gestational diabetes    Migraines    Pregnancy induced hypertension    S/P cesarean section 10/29/2016     Social History   Tobacco Use   Smoking status: Never   Smokeless tobacco: Never  Vaping Use   Vaping Use: Never used  Substance Use Topics   Alcohol use: No   Drug use: No    Past Surgical History:  Procedure Laterality Date   ADENOIDECTOMY  10/29/2007   BREAST REDUCTION SURGERY     CESAREAN SECTION N/A 10/29/2016   Procedure: CESAREAN SECTION;  Surgeon: Janyth Contes, MD;  Location: [facility];  Service: Obstetrics;  Laterality: N/A;   CESAREAN SECTION N/A 07/25/2021   Procedure: Repeat CESAREAN SECTION;  Surgeon: Aloha Gell, MD;  Location: [facility];  Service: Obstetrics;  Laterality: N/A;   Repeat C/S BTL   LAPAROSCOPY  16/06/9603   NISSEN FUNDOPLICATION     RHINOPLASTY  03/22/2010   deviated septum   TONSILLECTOMY  2017    Family History  Problem Relation Age of Onset   Heart disease Mother    Hypertension Mother    Hypertension Father     No Known Allergies   Current Medications:   Current Outpatient Medications:    acetaminophen (TYLENOL) 500 MG tablet, Take 2 tablets (1,000 mg total) by mouth every 6 (six) hours., Disp: 30 tablet, Rfl: 0   Prenatal MV-Min-FA-Omega-3 (PRENATAL GUMMIES/DHA & FA) 0.4-32.5 MG CHEW, Chew 2 each by mouth at bedtime., Disp: , Rfl:    ferrous sulfate 325 (65 FE) MG EC tablet, Take 1 tablet (325 mg total) by mouth 3 (three) times daily with meals., Disp: 30 tablet, Rfl: 0   vortioxetine HBr (TRINTELLIX) 20 MG TABS tablet, Take 1 tablet (20 mg total) by mouth daily at 6 PM. (Patient not taking: Reported on 09/14/2021), Disp: 90 tablet, Rfl:  1   Review of Systems:   ROS Negative unless otherwise specified per HPI. Vitals:   Vitals:   09/14/21 1058  BP: 122/70  Pulse: 90  Temp: 98 F (36.7 C)  TempSrc: Temporal  SpO2: 99%  Weight: 232 lb 12.8 oz (105.6 kg)  Height: 5\' 5"  (1.651 m)      Body mass index is 38.74 kg/m.  Physical Exam:   Physical Exam Vitals and nursing note reviewed.  Constitutional:      General: She is not in acute distress.    Appearance: Normal appearance. She is well-developed. She is not ill-appearing or toxic-appearing.  HENT:     Head: Normocephalic and atraumatic.     Right Ear: Tympanic  membrane, ear canal and external ear normal. Tympanic membrane is not erythematous, retracted or bulging.     Left Ear: Tympanic membrane, ear canal and external ear normal. Tympanic membrane is not erythematous, retracted or bulging.  Eyes:     General: Lids are normal.     Conjunctiva/sclera: Conjunctivae normal.     Pupils: Pupils are equal, round, and reactive to light.  Neck:     Trachea: Trachea normal.  Cardiovascular:     Rate and Rhythm: Normal rate and regular rhythm.     Heart sounds: Normal heart sounds, S1 normal and S2 normal.  Pulmonary:     Effort: Pulmonary effort is normal. No tachypnea or respiratory distress.     Breath sounds: Normal breath sounds. No decreased breath sounds, wheezing, rhonchi or rales.  Abdominal:     General: Bowel sounds are normal.     Palpations: Abdomen is soft.     Tenderness: There is no abdominal tenderness.  Musculoskeletal:        General: Normal range of motion.     Cervical back: Full passive range of motion without pain.  Lymphadenopathy:     Cervical: No cervical adenopathy.  Skin:    General: Skin is warm and dry.  Neurological:     Mental Status: She is alert.     GCS: GCS eye subscore is 4. GCS verbal subscore is 5. GCS motor subscore is 6.     Cranial Nerves: No cranial nerve deficit.     Sensory: No sensory deficit.     Deep  Tendon Reflexes: Reflexes are normal and symmetric.  Psychiatric:        Speech: Speech normal.        Behavior: Behavior normal. Behavior is cooperative.    Assessment and Plan:   Attention deficit hyperactivity disorder (ADHD), combined type No red flags per database review  Start Adderall XR 30 mg daily -- will trial this as more affordable option to Vyvanse Informed patient to reach out via mychart in two weeks if continued use is desired  Follow up in 3 months, sooner if concerns occur  Generalized anxiety disorderModerate episode of recurrent major depressive disorder ([place]) Overall controlled Continue Wellbutrin SR 150 mg twice daily -- consider switch to Wellbutrin 300 mg XR daily Patient denies SI/HI at today's visit I advised patient that if they develop any SI, to tell someone immedicately and seek medical attention  Sleep difficulties Maintain use of Remeron 15 mg prn for sleep Follow up as needed   Gestational diabetes mellitus, antepartum Well controlled Continue working on healthy lifestyle Continue to monitor, yearly A1c and further intervention as neede  Anemia Well controlled Continue to monitor  Hx of gestational HTN Normotensive in office Continue to monitor   I,Havlyn C Ratchford,acting as a scribe for [employer], PA.,have documented all relevant documentation on the behalf of Inda Coke, PA,as directed by  Inda Coke, PA while in the presence of Inda Coke, [place].  I, Inda Coke, [place], have reviewed all documentation for this visit. The documentation on 09/14/21 for the exam, diagnosis, procedures, and orders are all accurate and complete.  Time spent with patient today was 45 minutes which consisted of chart review, discussing diagnosis, work up, treatment answering questions and documentation.    Inda Coke, [name]

## 2021-09-14 NOTE — Patient Instructions (Addendum)
It was great to see you!  Start adderall 30 mg extended release Let me know via mychart after about two weeks if you'd like to continue this dose Follow-up IN OFFICE in 3 months, sooner if concerns  Take care,  Inda Coke [name]

## 2021-09-24 ENCOUNTER — Encounter: Payer: Self-pay | Admitting: Physician Assistant

## 2021-09-24 NOTE — Telephone Encounter (Signed)
See note

## 2021-10-03 ENCOUNTER — Ambulatory Visit

## 2021-10-14 ENCOUNTER — Encounter: Payer: Self-pay | Admitting: Physician Assistant

## 2021-10-14 ENCOUNTER — Other Ambulatory Visit: Payer: Self-pay | Admitting: Physician Assistant

## 2021-10-15 ENCOUNTER — Ambulatory Visit: Admitting: Physician Assistant

## 2021-10-15 MED ORDER — AMPHETAMINE-DEXTROAMPHET ER 30 MG PO CP24: 30.0000 mg | ORAL_CAPSULE | Freq: Every day | ORAL | 0 refills | Status: DC

## 2021-10-19 ENCOUNTER — Encounter: Payer: Self-pay | Admitting: Physician Assistant

## 2021-10-19 NOTE — Telephone Encounter (Signed)
Morgan Chang, please see message and advise what else you can prescribe for patient.

## 2021-10-20 ENCOUNTER — Other Ambulatory Visit: Payer: Self-pay | Admitting: Physician Assistant

## 2021-10-20 MED ORDER — METHYLPHENIDATE HCL ER (LA) 20 MG PO CP24: 20.0000 mg | ORAL_CAPSULE | ORAL | 0 refills | Status: DC

## 2021-10-20 MED ORDER — VORTIOXETINE HBR 5 MG PO TABS: 5.0000 mg | ORAL_TABLET | Freq: Every day | ORAL | 1 refills | Status: DC

## 2021-10-28 ENCOUNTER — Ambulatory Visit: Payer: Self-pay

## 2021-11-01 ENCOUNTER — Institutional Professional Consult (permissible substitution): Payer: Self-pay | Admitting: Plastic Surgery

## 2021-11-01 ENCOUNTER — Encounter: Payer: Self-pay | Admitting: Physician Assistant

## 2021-11-01 ENCOUNTER — Other Ambulatory Visit: Payer: Self-pay

## 2021-11-01 ENCOUNTER — Telehealth (INDEPENDENT_AMBULATORY_CARE_PROVIDER_SITE_OTHER): Admitting: Physician Assistant

## 2021-11-01 VITALS — Ht 65.0 in | Wt 225.0 lb

## 2021-11-01 DIAGNOSIS — E669 Obesity, unspecified: Secondary | ICD-10-CM | POA: Diagnosis not present

## 2021-11-01 DIAGNOSIS — F411 Generalized anxiety disorder: Secondary | ICD-10-CM | POA: Diagnosis not present

## 2021-11-01 DIAGNOSIS — F331 Major depressive disorder, recurrent, moderate: Secondary | ICD-10-CM | POA: Diagnosis not present

## 2021-11-01 MED ORDER — WEGOVY 0.5 MG/0.5ML ~~LOC~~ SOAJ: 0.5000 mg | SUBCUTANEOUS | 1 refills | Status: DC

## 2021-11-01 MED ORDER — WEGOVY 0.25 MG/0.5ML ~~LOC~~ SOAJ: 0.2500 mg | SUBCUTANEOUS | 0 refills | Status: DC

## 2021-11-01 NOTE — Addendum Note (Signed)
Addended by: Marian Sorrow on: 11/01/2021 01:59 PM   Modules accepted: Orders

## 2021-11-01 NOTE — Progress Notes (Signed)
I acted as a Education administrator for [employer], [name] Anselmo Pickler, LPN  Virtual Visit via Video Note   I, , connected with  Morgan Chang  ([id number], March 20, 1996) on 11/01/21 at  1:30 PM EST by a video-enabled telemedicine application and verified that I am speaking with the correct person using two identifiers.  Location: Patient: Home Provider: [place] office   I discussed the limitations of evaluation and management by telemedicine and the availability of in person appointments. The patient expressed understanding and agreed to proceed.    History of Present Illness: Morgan Chang is a 26 y.o. who identifies as a female who was assigned female at birth, and is being seen today for weight loss medication and depression.    Obesity Pt would like to discuss weight loss medications. She has checked her insurance and [place] and Ozempic are covered. Pt is doing calorie restriction 1800 a day and walking. She denies prior hx of pancreatitis or fam hx of thyroid cancer. She has not noticed benefit from appetite suppression with Ritalin LA. She would like to trial GLP-1.  Depression and Anxiety She is currently on wellbutrin 300 mg daily. She feels as though this is working well for her currently. Earlier in January, she messaged me, stating that she was interested in switching to trintellix as she has done well with this in the past and her anxiety had worsened. Since that time, she has not received the trintellix due to PA issues, but now feels her Wellbutrin is working well. Denies SI/HI.   Problems:  Patient Active Problem List   Diagnosis Date Noted   History of gestational hypertension 09/14/2021   Anemia 09/14/2021   Attention deficit hyperactivity disorder (ADHD), 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   Gestational diabetes mellitus (GDM), antepartum 04/14/2021   Generalized anxiety disorder 03/11/2020   Moderate episode of recurrent major depressive disorder ([place]) 03/11/2020    Sleep difficulties 03/11/2020    Allergies: No Known Allergies Medications:  Current Outpatient Medications:    buPROPion (WELLBUTRIN XL) 300 MG 24 hr tablet, Take 300 mg by mouth daily., Disp: , Rfl:    methylphenidate (RITALIN LA) 20 MG 24 hr capsule, Take 1 capsule (20 mg total) by mouth every morning., Disp: 30 capsule, Rfl: 0   mirtazapine (REMERON) 15 MG tablet, Take 1 tablet (15 mg total) by mouth at bedtime., Disp: 60 tablet, Rfl: 0   Prenatal MV-Min-FA-Omega-3 (PRENATAL GUMMIES/DHA & FA) 0.4-32.5 MG CHEW, Chew 2 each by mouth at bedtime., Disp: , Rfl:   Observations/Objective: Patient is well-developed, well-nourished in no acute distress.  Resting comfortably  at home.  Head is normocephalic, atraumatic.  No labored breathing.  Speech is clear and coherent with logical content.  Patient is alert and oriented at baseline.    Assessment and Plan: 1. Obesity, unspecified classification, unspecified obesity type, unspecified whether serious comorbidity present Uncontrolled I believe she is a good candidate for Wegovy - no obvious contraindications Sample provided of 2.5 mg x 1 month; may increase to [facility] 0.5 mg weekly after sample completed if desired Will go ahead and send in next dose for patient Follow-up with me in 1 month, sooner if concerns  2. Generalized anxiety disorder; 3. Moderate episode of recurrent major depressive disorder (HCC) Well controlled Continue wellbutrin 300 mg SR daily Follow-up in 6 months, sooner if concerns I discussed with patient that if they develop any SI, to tell someone immediately and seek medical attention.  Follow Up Instructions: I discussed the  assessment and treatment plan with the patient. The patient was provided an opportunity to ask questions and all were answered. The patient agreed with the plan and demonstrated an understanding of the instructions.  A copy of instructions were sent to the patient via MyChart unless otherwise  noted below.   The patient was advised to call back or seek an in-person evaluation if the symptoms worsen or if the condition fails to improve as anticipated.  Inda Coke, [place]

## 2021-11-02 ENCOUNTER — Telehealth: Payer: Self-pay | Admitting: *Deleted

## 2021-11-02 NOTE — Telephone Encounter (Signed)
(  Key: [id number]) Rx #: [id number] 0.5MG /0.5ML auto-injectors Waiting for determination

## 2021-11-03 NOTE — Telephone Encounter (Signed)
PA Denied  After reviewing the information provided it has been determined that the request cannot be approved. Coverage is provided in situations where the patient is greater than or equal to 28 years of age and has tried and failed or has a contraindication to ALL of the following agents: generic phentermine, Qsymia, Xenical, and Contrave

## 2021-11-05 NOTE — Telephone Encounter (Signed)
Wait for Determination Please wait for [employer] [employer] to return a determination. 

## 2021-11-08 NOTE — Telephone Encounter (Signed)
Your prior authorization for Mancel Parsons has been denied

## 2021-11-09 NOTE — Telephone Encounter (Signed)
Left message on voicemail to call office.  

## 2021-11-10 ENCOUNTER — Encounter: Payer: Self-pay | Admitting: Physician Assistant

## 2021-11-10 ENCOUNTER — Ambulatory Visit: Payer: Self-pay | Admitting: Physician Assistant

## 2021-11-10 NOTE — Telephone Encounter (Signed)
Patient has called back.  I have given her Sam's response.    Please give patient a call back at [phone] with any other options.

## 2021-11-11 ENCOUNTER — Encounter: Payer: Self-pay | Admitting: *Deleted

## 2021-11-11 NOTE — Telephone Encounter (Signed)
Called pt to clarify spelling of insurance Morgan Chang asked her if she has a phone number I can call? Pt said let me look and I will call you right back.

## 2021-11-11 NOTE — Telephone Encounter (Signed)
[place] at [phone] and gave my number to an automated system to call me back due to wait time is greater than 25 minutes.

## 2021-11-11 NOTE — Telephone Encounter (Signed)
Spoke to pt told her I am going to try to do PA again for [facility] 0.5 mg need to know ID number for [facility] insurance? Pt said insurance changed it is now [place] and ID # is her social security [id number]. Told her okay, I will let you know. Pt verbalized understanding.

## 2021-11-11 NOTE — Telephone Encounter (Signed)
Pt called back # is [phone] or [phone].

## 2021-11-17 ENCOUNTER — Encounter: Payer: Self-pay | Admitting: Physician Assistant

## 2021-11-17 NOTE — Telephone Encounter (Signed)
Pt called back told her tried to do PA for [facility] through [employer] and was told it would be denied. There is no coverage for weigh loss medications. She asked if there was anything else? Told her I tried Ozempic and that showed $274.00 and also they said you have a 2nd insurance. Pt said yes, but coverage was suppose to be discontinued to [facility]. Told her okay, sorry no other medications available. Pt verbalized understanding. ?

## 2021-11-17 NOTE — Telephone Encounter (Signed)
Late entry from yesterday 11/16/2021 [facility] was transferred several times finally spoke to someone and they said I need to contact [employer] for PA for medications. ?

## 2021-11-17 NOTE — Telephone Encounter (Signed)
Left message on voicemail to call office.  

## 2021-11-17 NOTE — Telephone Encounter (Signed)
Called [employer] and spoke to [place], told her need to do a prior authorization for [facility]. Alyse Low said no coverage for Wegovy, Saxenda or Qsymia and also showing pt has 2nd insurance. Told her I am not sure pt told me this was her insurance now. Asked her if Ozempic would be covered? She looked it up and would be around $274 dollars. Told her okay I will let pt know and let her know that it shows another coverage. Alyse Low said if pt does not have another insurance she will need to contact [place] to straighten it out. Told her okay I will let pt know, thank you. ?

## 2021-11-18 ENCOUNTER — Other Ambulatory Visit: Payer: Self-pay | Admitting: Physician Assistant

## 2021-11-18 MED ORDER — LISDEXAMFETAMINE DIMESYLATE 30 MG PO CAPS: 30.0000 mg | ORAL_CAPSULE | Freq: Every day | ORAL | 0 refills | Status: DC

## 2021-11-23 ENCOUNTER — Ambulatory Visit: Payer: Self-pay | Admitting: Family Medicine

## 2021-11-26 ENCOUNTER — Ambulatory Visit: Payer: Self-pay | Admitting: Family Medicine

## 2021-11-26 NOTE — Telephone Encounter (Signed)
Patient would like to continue taking ritalin due to her insurance not covering the vyvanse. She would like a refill sent to her pharmacy.  ?

## 2021-11-29 ENCOUNTER — Other Ambulatory Visit: Payer: Self-pay | Admitting: Physician Assistant

## 2021-11-29 ENCOUNTER — Ambulatory Visit: Payer: Self-pay | Admitting: Physician Assistant

## 2021-11-29 MED ORDER — METHYLPHENIDATE HCL ER (LA) 20 MG PO CP24: 20.0000 mg | ORAL_CAPSULE | ORAL | 0 refills | Status: DC

## 2021-12-05 ENCOUNTER — Ambulatory Visit: Admission: EM | Admit: 2021-12-05 | Discharge: 2021-12-05 | Disposition: A | Discharge status: Home / Self Care

## 2021-12-05 ENCOUNTER — Other Ambulatory Visit: Payer: Self-pay

## 2021-12-05 DIAGNOSIS — R000 Tachycardia, unspecified: Secondary | ICD-10-CM

## 2021-12-05 DIAGNOSIS — Z9189 Other specified personal risk factors, not elsewhere classified: Secondary | ICD-10-CM

## 2021-12-05 DIAGNOSIS — R509 Fever, unspecified: Secondary | ICD-10-CM | POA: Diagnosis not present

## 2021-12-05 DIAGNOSIS — R052 Subacute cough: Secondary | ICD-10-CM

## 2021-12-05 DIAGNOSIS — R52 Pain, unspecified: Secondary | ICD-10-CM | POA: Diagnosis not present

## 2021-12-05 DIAGNOSIS — B349 Viral infection, unspecified: Secondary | ICD-10-CM

## 2021-12-05 DIAGNOSIS — R051 Acute cough: Secondary | ICD-10-CM

## 2021-12-05 DIAGNOSIS — R0789 Other chest pain: Secondary | ICD-10-CM

## 2021-12-05 MED ORDER — OSELTAMIVIR PHOSPHATE 75 MG PO CAPS: 75.0000 mg | ORAL_CAPSULE | Freq: Two times a day (BID) | ORAL | 0 refills | Status: DC

## 2021-12-05 MED ORDER — PROMETHAZINE-DM 6.25-15 MG/5ML PO SYRP: 5.0000 mL | ORAL_SOLUTION | Freq: Every evening | ORAL | 0 refills | Status: DC | PRN

## 2021-12-05 MED ORDER — BENZONATATE 100 MG PO CAPS: 100.0000 mg | ORAL_CAPSULE | Freq: Three times a day (TID) | ORAL | 0 refills | Status: DC | PRN

## 2021-12-05 MED ORDER — IBUPROFEN 800 MG PO TABS
800.0000 mg | ORAL_TABLET | Freq: Once | ORAL | Status: AC
  Administered 2021-12-05: 800 mg via ORAL

## 2021-12-05 NOTE — Discharge Instructions (Signed)
We will notify you of your test results as they arrive and may take between 48-72 hours.  I encourage you to sign up for MyChart if you have not already done so as this can be the easiest way for [place] to communicate results to you online or through a phone app.  Generally, we only contact you if it is a positive test result.  In the meantime, if you develop worsening symptoms including fever, chest pain, shortness of breath despite our current treatment plan then please report to the emergency room as this may be a sign of worsening status from possible viral infection. ? ?Otherwise, we will manage this as a viral syndrome such as influenza with Tamiflu. If your results are positive for COVID stop Tamiflu and our nurse, RN Valetta Fuller, will send a prescription for you to take Paxlovid. For sore throat or cough try using a honey-based tea. Use 3 teaspoons of honey with juice squeezed from half lemon. Place shaved pieces of ginger into 1/2-1 cup of water and warm over stove top. Then mix the ingredients and repeat every 4 hours as needed. Please take Tylenol '500mg'$ -'650mg'$  every 6 hours for aches and pains, fevers. Hydrate very well with at least 2 liters of water. Eat light meals such as soups to replenish electrolytes and soft fruits, veggies. Start an antihistamine like Zyrtec for postnasal drainage, sinus congestion.  You can take this together with the cough medications. ?

## 2021-12-05 NOTE — ED Triage Notes (Signed)
Pt reports fever 102.0 F, cough, chills, body aches and chest congestion x 1 day.  ?

## 2021-12-05 NOTE — ED Provider Notes (Signed)
?[place] ? ? ?MRN: [id number] DOB: 1996/04/03 ? ?Subjective:  ? ?Morgan Chang is a 26 y.o. female presenting for 1 day history of acute onset high fevers, coughing, chills, chest pain, body aches, chest congestion.  Feels like she had a rapid decline.  No history of asthma or COPD.  She is not a smoker.  Has not had COVID or flu in the past 6 months.  No history of kidney disease. ? ?No current facility-administered medications for this encounter. ? ?Current Outpatient Medications:  ?  magnesium citrate solution, Take 296 mLs by mouth once., Disp: , Rfl:  ?  Magnesium Gluconate (MAGNESIUM 27 PO), Take by mouth., Disp: , Rfl:  ?  VITAMIN D PO, Take by mouth., Disp: , Rfl:  ?  buPROPion (WELLBUTRIN XL) 300 MG 24 hr tablet, Take 300 mg by mouth daily., Disp: , Rfl:  ?  methylphenidate (RITALIN LA) 20 MG 24 hr capsule, Take 1 capsule (20 mg total) by mouth every morning., Disp: 30 capsule, Rfl: 0 ?  mirtazapine (REMERON) 15 MG tablet, Take 1 tablet (15 mg total) by mouth at bedtime., Disp: 60 tablet, Rfl: 0 ?  Prenatal MV-Min-FA-Omega-3 (PRENATAL GUMMIES/DHA & FA) 0.4-32.5 MG CHEW, Chew 2 each by mouth at bedtime., Disp: , Rfl:   ? ?No Known Allergies ? ?Past Medical History:  ?Diagnosis Date  ? Anemia 09/14/2021  ? Anxiety   ? Depression   ? Endometriosis   ? Fibroid   ? Frank breech presentation 10/29/2016  ? Gestational diabetes   ? Migraines   ? Pregnancy induced hypertension   ? S/P cesarean section 10/29/2016  ?  ? ?Past Surgical History:  ?Procedure Laterality Date  ? ADENOIDECTOMY  10/29/2007  ? BREAST REDUCTION SURGERY    ? CESAREAN SECTION N/A 10/29/2016  ? Procedure: CESAREAN SECTION;  Surgeon: Janyth Contes, MD;  Location: [facility];  Service: Obstetrics;  Laterality: N/A;  ? CESAREAN SECTION N/A 07/25/2021  ? Procedure: Repeat CESAREAN SECTION;  Surgeon: Aloha Gell, MD;  Location: [facility];  Service: Obstetrics;  Laterality: N/A;   Repeat C/S BTL  ? LAPAROSCOPY   12/10/2014  ? NISSEN FUNDOPLICATION    ? RHINOPLASTY  03/22/2010  ? deviated septum  ? TONSILLECTOMY  2017  ? TUBAL LIGATION  07/25/2021  ? ? ?Family History  ?Problem Relation Age of Onset  ? Heart disease Mother   ? Hypertension Mother   ? Anxiety disorder Mother   ? Depression Mother   ? Hypertension Father   ? Anxiety disorder Father   ? Depression Father   ? Testicular cancer Brother   ? ? ?Social History  ? ?Tobacco Use  ? Smoking status: Never  ? Smokeless tobacco: Never  ?Vaping Use  ? Vaping Use: Never used  ?Substance Use Topics  ? Alcohol use: No  ? Drug use: No  ? ? ?ROS ? ? ?Objective:  ? ?Vitals: ?BP 116/81 (BP Location: Right Arm)   Pulse (!) 114   Temp (!) 100.8 ?F (38.2 ?C) (Oral)   Resp 18   LMP 11/23/2021 (Exact Date)   SpO2 98%   Breastfeeding No  ? ?Physical Exam ?Constitutional:   ?   General: She is not in acute distress. ?   Appearance: Normal appearance. She is well-developed and normal weight. She is not ill-appearing, toxic-appearing or diaphoretic.  ?HENT:  ?   Head: Normocephalic and atraumatic.  ?   Right Ear: Tympanic membrane, ear canal and external ear normal. No  drainage or tenderness. No middle ear effusion. There is no impacted cerumen. Tympanic membrane is not erythematous.  ?   Left Ear: Tympanic membrane, ear canal and external ear normal. No drainage or tenderness.  No middle ear effusion. There is no impacted cerumen. Tympanic membrane is not erythematous.  ?   Nose: Congestion present. No rhinorrhea.  ?   Mouth/Throat:  ?   Mouth: Mucous membranes are moist. No oral lesions.  ?   Pharynx: Posterior oropharyngeal erythema present. No pharyngeal swelling, oropharyngeal exudate or uvula swelling.  ?   Tonsils: No tonsillar exudate or tonsillar abscesses.  ?   Comments: Significant postnasal drainage overlying pharynx with associated erythema. ?Eyes:  ?   General: No scleral icterus.    ?   Right eye: No discharge.     ?   Left eye: No discharge.  ?   Extraocular Movements:  Extraocular movements intact.  ?   Right eye: Normal extraocular motion.  ?   Left eye: Normal extraocular motion.  ?   Conjunctiva/sclera: Conjunctivae normal.  ?Cardiovascular:  ?   Rate and Rhythm: Tachycardia present.  ?   Heart sounds: No murmur heard. ?  No friction rub. No gallop.  ?Pulmonary:  ?   Effort: Pulmonary effort is normal. No respiratory distress.  ?   Breath sounds: No stridor. No wheezing, rhonchi or rales.  ?Chest:  ?   Chest wall: No tenderness.  ?Musculoskeletal:  ?   Cervical back: Normal range of motion and neck supple.  ?Lymphadenopathy:  ?   Cervical: No cervical adenopathy.  ?Skin: ?   General: Skin is warm and dry.  ?Neurological:  ?   General: No focal deficit present.  ?   Mental Status: She is alert and oriented to person, place, and time.  ?Psychiatric:     ?   Mood and Affect: Mood normal.     ?   Behavior: Behavior normal.  ? ? ?Assessment and Plan :  ? ?PDMP not reviewed this encounter. ? ?1. Acute viral syndrome   ?2. Fever, unspecified   ?3. Body aches   ?4. Subacute cough   ?5. Atypical chest pain   ?6. At increased risk of exposure to COVID-19 virus   ?7. Acute cough   ?8. Tachycardia, unspecified   ? ?Patient declined a chest x-ray.  Recommended that we go ahead and cover for an acute viral syndrome such as influenza with Tamiflu.  She is agreeable to this.  For COVID and flu test is pending.  It is possible that she has COVID instead but given the window to start Tamiflu we decided to go with that.  If she turns out to have COVID-19, she would be a good candidate for Paxlovid.  Her blood work confirms that she has never had kidney disease and therefore deferred repeat testing which the patient prefers.  Use supportive care otherwise.  Counseled patient on potential for adverse effects with medications prescribed/recommended today, ER and return-to-clinic precautions discussed, patient verbalized understanding. ? ?  ?Jaynee Eagles, [name] ?12/05/21 1120 ? ?

## 2021-12-06 LAB — COVID-19, FLU A+B NAA
Influenza A, NAA: NOT DETECTED
Influenza B, NAA: NOT DETECTED
SARS-CoV-2, NAA: DETECTED — AB

## 2021-12-07 ENCOUNTER — Ambulatory Visit: Payer: Self-pay | Admitting: Physician Assistant

## 2021-12-07 ENCOUNTER — Telehealth (HOSPITAL_COMMUNITY): Payer: Self-pay | Admitting: Internal Medicine

## 2021-12-07 MED ORDER — NIRMATRELVIR/RITONAVIR (PAXLOVID)TABLET: 3.0000 | ORAL_TABLET | Freq: Two times a day (BID) | ORAL | 0 refills | Status: AC

## 2021-12-07 NOTE — Telephone Encounter (Signed)
COVID-19 test is positive.  Paxlovid has been sent to the pharmacy on file.  GFR is greater than 60

## 2021-12-13 ENCOUNTER — Other Ambulatory Visit: Payer: Self-pay

## 2021-12-13 ENCOUNTER — Encounter: Payer: Self-pay | Admitting: Plastic Surgery

## 2021-12-13 ENCOUNTER — Ambulatory Visit (INDEPENDENT_AMBULATORY_CARE_PROVIDER_SITE_OTHER): Admitting: Plastic Surgery

## 2021-12-13 ENCOUNTER — Telehealth: Payer: Self-pay | Admitting: *Deleted

## 2021-12-13 DIAGNOSIS — M793 Panniculitis, unspecified: Secondary | ICD-10-CM

## 2021-12-13 NOTE — Telephone Encounter (Signed)
Called and spoke with the patient and she will be getting her op notes from [facility] for us.//AB/CMA ?

## 2021-12-13 NOTE — Progress Notes (Signed)
? ?  Patient ID: Morgan Chang, female    DOB: April 24, 1996, 26 y.o.   MRN: [id number] ? ? ?Chief Complaint  ?Patient presents with  ? Skin Problem  ? ? ?The patient is a 26 yrs old female here for evaluation of her abdomen.  She is 5 feet 6 inches tall and weighs 214 pounds.  In the last year she decreased her weight from 260 pounds.  She has a baby that is 59 months old and a child who is 66 years old.  She did not breast-feed.  She had a breast reduction in 2016 in [place].  She does not have a family history of breast cancer.  She thinks she is a DDD cup and would like to be a C cup.  Her past surgical history includes a C-section, Nissen fundoplication and tubal ligation.  She is not a smoker and does not have diabetes.  She complains of neck pain and back pain.  She has good strength of her muscles of her abdomen but does have a slight rectus diastases.  This may be a result of only being 5 months post gestation. ? ? ?Review of Systems  ?Constitutional:  Positive for activity change. Negative for appetite change.  ?HENT: Negative.    ?Eyes: Negative.   ?Respiratory: Negative.  Negative for chest tightness.   ?Cardiovascular: Negative.  Negative for leg swelling.  ?Gastrointestinal: Negative.  Negative for abdominal distention.  ?Endocrine: Negative.   ?Genitourinary: Negative.   ?Musculoskeletal:  Positive for back pain and neck pain.  ?Skin:  Positive for rash.  ?Hematological: Negative.   ?Psychiatric/Behavioral: Negative.    ? ?Past Medical History:  ?Diagnosis Date  ? Anemia 09/14/2021  ? Anxiety   ? Depression   ? Endometriosis   ? Fibroid   ? Frank breech presentation 10/29/2016  ? Gestational diabetes   ? Migraines   ? Pregnancy induced hypertension   ? S/P cesarean section 10/29/2016  ?  ?Past Surgical History:  ?Procedure Laterality Date  ? ADENOIDECTOMY  10/29/2007  ? BREAST REDUCTION SURGERY    ? CESAREAN SECTION N/A 10/29/2016  ? Procedure: CESAREAN SECTION;  Surgeon: Janyth Contes, MD;   Location: [facility];  Service: Obstetrics;  Laterality: N/A;  ? CESAREAN SECTION N/A 07/25/2021  ? Procedure: Repeat CESAREAN SECTION;  Surgeon: Aloha Gell, MD;  Location: [facility];  Service: Obstetrics;  Laterality: N/A;   Repeat C/S BTL  ? LAPAROSCOPY  12/10/2014  ? NISSEN FUNDOPLICATION    ? RHINOPLASTY  03/22/2010  ? deviated septum  ? TONSILLECTOMY  2017  ? TUBAL LIGATION  07/25/2021  ?  ? ? ?Current Outpatient Medications:  ?  benzonatate (TESSALON) 100 MG capsule, Take 1-2 capsules (100-200 mg total) by mouth 3 (three) times daily as needed for cough., Disp: 60 capsule, Rfl: 0 ?  buPROPion (WELLBUTRIN XL) 300 MG 24 hr tablet, Take 300 mg by mouth daily., Disp: , Rfl:  ?  magnesium citrate solution, Take 296 mLs by mouth once., Disp: , Rfl:  ?  Magnesium Gluconate (MAGNESIUM 27 PO), Take by mouth., Disp: , Rfl:  ?  methylphenidate (RITALIN LA) 20 MG 24 hr capsule, Take 1 capsule (20 mg total) by mouth every morning., Disp: 30 capsule, Rfl: 0 ?  mirtazapine (REMERON) 15 MG tablet, Take 1 tablet (15 mg total) by mouth at bedtime., Disp: 60 tablet, Rfl: 0 ?  oseltamivir (TAMIFLU) 75 MG capsule, Take 1 capsule (75 mg total) by mouth 2 (two) times daily.,  Disp: 10 capsule, Rfl: 0 ?  Prenatal MV-Min-FA-Omega-3 (PRENATAL GUMMIES/DHA & FA) 0.4-32.5 MG CHEW, Chew 2 each by mouth at bedtime., Disp: , Rfl:  ?  promethazine-dextromethorphan (PROMETHAZINE-DM) 6.25-15 MG/5ML syrup, Take 5 mLs by mouth at bedtime as needed for cough., Disp: 100 mL, Rfl: 0 ?  VITAMIN D PO, Take by mouth., Disp: , Rfl:   ? ?Objective:  ? ?There were no vitals filed for this visit. ? ?Physical Exam ?Vitals reviewed.  ?Constitutional:   ?   Appearance: Normal appearance.  ?HENT:  ?   Head: Normocephalic and atraumatic.  ?Cardiovascular:  ?   Rate and Rhythm: Normal rate.  ?   Pulses: Normal pulses.  ?Pulmonary:  ?   Effort: Pulmonary effort is normal.  ?Abdominal:  ?   General: There is no distension.  ?   Palpations: Abdomen is  soft.  ?Musculoskeletal:     ?   General: No swelling.  ?Skin: ?   Capillary Refill: Capillary refill takes less than 2 seconds.  ?   Coloration: Skin is not jaundiced.  ?   Findings: No bruising.  ?Neurological:  ?   Mental Status: She is alert and oriented to person, place, and time.  ?Psychiatric:     ?   Mood and Affect: Mood normal.     ?   Behavior: Behavior normal.     ?   Thought Content: Thought content normal.  ? ? ?Assessment & Plan:  ?Panniculitis ? ?Recommend that she give more time for normalization of her abdomen, weight loss and normalization of her hormones prior to surgery on either her abdomen or her breasts.  In the meantime I asked that she get her op note from [place] so we can look at which technique was used.  I recommend high-protein low sugar and carb diet.  And follow-up in 6 months. ? ?Pictures were obtained of the patient and placed in the chart with the patient's or guardian's permission. ? ?Loel Lofty Yoneko Talerico, DO ?

## 2021-12-14 ENCOUNTER — Ambulatory Visit: Payer: Self-pay | Admitting: Physician Assistant

## 2021-12-28 ENCOUNTER — Encounter: Payer: Self-pay | Admitting: Physician Assistant

## 2021-12-28 ENCOUNTER — Ambulatory Visit (INDEPENDENT_AMBULATORY_CARE_PROVIDER_SITE_OTHER): Admitting: Physician Assistant

## 2021-12-28 VITALS — BP 110/80 | HR 79 | Temp 98.3°F | Ht 66.0 in | Wt 220.2 lb

## 2021-12-28 DIAGNOSIS — F331 Major depressive disorder, recurrent, moderate: Secondary | ICD-10-CM | POA: Diagnosis not present

## 2021-12-28 DIAGNOSIS — F902 Attention-deficit hyperactivity disorder, combined type: Secondary | ICD-10-CM | POA: Diagnosis not present

## 2021-12-28 DIAGNOSIS — F411 Generalized anxiety disorder: Secondary | ICD-10-CM | POA: Diagnosis not present

## 2021-12-28 DIAGNOSIS — O24414 Gestational diabetes mellitus in pregnancy, insulin controlled: Secondary | ICD-10-CM

## 2021-12-28 LAB — POCT GLYCOSYLATED HEMOGLOBIN (HGB A1C): Hemoglobin A1C: 5.2 % (ref 4.0–5.6)

## 2021-12-28 MED ORDER — BUPROPION HCL ER (XL) 150 MG PO TB24: 150.0000 mg | ORAL_TABLET | Freq: Every day | ORAL | 1 refills | Status: DC

## 2021-12-28 MED ORDER — LISDEXAMFETAMINE DIMESYLATE 70 MG PO CAPS: 70.0000 mg | ORAL_CAPSULE | Freq: Every day | ORAL | 0 refills | Status: DC

## 2021-12-28 MED ORDER — CITALOPRAM HYDROBROMIDE 10 MG PO TABS: 10.0000 mg | ORAL_TABLET | Freq: Every day | ORAL | 1 refills | Status: DC

## 2021-12-28 NOTE — Progress Notes (Signed)
Morgan Chang is a 26 y.o. female here for a follow up of a pre-existing problem. ? ?History of Present Illness:  ? ?Chief Complaint  ?Patient presents with  ? Anxiety  ?  Pt would like to discuss medication for anxiety.   ? Depression  ? ADHD  ?  Pt needs refill but,  pt says it is not working well. She would like to discuss other medications.  ? ? ?Anxiety/Depression ?Morgan Chang is currently compliant with taking wellbutrin XL 300 mg daily  with no adverse effects. States that she stopped taking the remeron 15 mg daily about 2 weeks ago due to finding it ineffective for her worsening anxiety. She describes anxiety as being racing thoughts that are seemingly unreasonable. Morgan Chang provided examples such as experiencing anxiety when reading text messages or deleting a facebook post due to it not getting enough likes. At this time she would like to continue the Wellbutrin, but trial a different medication for her anxiety. She would also like to receive a referral for talk therapy. Denies SI/HI.  ? ?She has tried multiple SSRIs in the past -- prozac, zoloft and lexapro that has caused headaches. ? ?ADHD ?Although pt has been compliant with taking ritalin 20 mg daily, she has found it is not working for her throughout the day. States that she is able to focus after taking the medication but as the day goes on she can feel it wearing off. Morgan Chang has trialed vyvanse in the past and found it beneficial prior to having to stop it due to pregnancy. At this time she is interested in restarting this medication. She was on 70 mg vyvanse daily most recently. ? ? ?Hx of Gestational Diabetes ?For the past couple of weeks, pt has been experiencing lightheadedness following eating something sugary and increased thirst. States that when she had gestational diabetes, she was experiencing the same sx. Due to this she is worried that she could be developing diabetes and would like this checked today.  ? ?Past Medical History:  ?Diagnosis  Date  ? Anemia 09/14/2021  ? Anxiety   ? Depression   ? Endometriosis   ? Fibroid   ? Frank breech presentation 10/29/2016  ? Gestational diabetes   ? Migraines   ? Pregnancy induced hypertension   ? S/P cesarean section 10/29/2016  ? ?  ?Social History  ? ?Tobacco Use  ? Smoking status: Never  ? Smokeless tobacco: Never  ?Vaping Use  ? Vaping Use: Never used  ?Substance Use Topics  ? Alcohol use: No  ? Drug use: No  ? ? ?Past Surgical History:  ?Procedure Laterality Date  ? ADENOIDECTOMY  10/29/2007  ? BREAST REDUCTION SURGERY    ? CESAREAN SECTION N/A 10/29/2016  ? Procedure: CESAREAN SECTION;  Surgeon: Janyth Contes, MD;  Location: [facility];  Service: Obstetrics;  Laterality: N/A;  ? CESAREAN SECTION N/A 07/25/2021  ? Procedure: Repeat CESAREAN SECTION;  Surgeon: Aloha Gell, MD;  Location: [facility];  Service: Obstetrics;  Laterality: N/A;   Repeat C/S BTL  ? LAPAROSCOPY  12/10/2014  ? NISSEN FUNDOPLICATION    ? RHINOPLASTY  03/22/2010  ? deviated septum  ? TONSILLECTOMY  2017  ? TUBAL LIGATION  07/25/2021  ? ? ?Family History  ?Problem Relation Age of Onset  ? Heart disease Mother   ? Hypertension Mother   ? Anxiety disorder Mother   ? Depression Mother   ? Hypertension Father   ? Anxiety disorder Father   ? Depression  Father   ? Testicular cancer Brother   ? ? ?No Known Allergies ? ?Current Medications:  ? ?Current Outpatient Medications:  ?  buPROPion (WELLBUTRIN XL) 150 MG 24 hr tablet, Take 1 tablet (150 mg total) by mouth daily., Disp: 90 tablet, Rfl: 1 ?  citalopram (CELEXA) 10 MG tablet, Take 1 tablet (10 mg total) by mouth daily., Disp: 90 tablet, Rfl: 1 ?  lisdexamfetamine (VYVANSE) 70 MG capsule, Take 1 capsule (70 mg total) by mouth daily., Disp: 30 capsule, Rfl: 0 ?  Magnesium Gluconate (MAGNESIUM 27 PO), Take by mouth., Disp: , Rfl:  ?  Prenatal MV-Min-FA-Omega-3 (PRENATAL GUMMIES/DHA & FA) 0.4-32.5 MG CHEW, Chew 2 each by mouth at bedtime., Disp: , Rfl:  ?  VITAMIN D PO, Take  by mouth., Disp: , Rfl:   ? ?Review of Systems:  ? ?Review of Systems  ?Psychiatric/Behavioral:  Positive for depression.   ?Negative unless otherwise specified per HPI. ? ?Vitals:  ? ?Vitals:  ? 12/28/21 0846  ?BP: 110/80  ?Pulse: 79  ?Temp: 98.3 ?F (36.8 ?C)  ?TempSrc: Temporal  ?SpO2: 97%  ?Weight: 220 lb 4 oz (99.9 kg)  ?Height: '5\' 6"'$  (1.676 m)  ?   ?Body mass index is 35.55 kg/m?. ? ?Physical Exam:  ? ?Physical Exam ?Vitals and nursing note reviewed.  ?Constitutional:   ?   General: She is not in acute distress. ?   Appearance: She is well-developed. She is not ill-appearing or toxic-appearing.  ?Cardiovascular:  ?   Rate and Rhythm: Normal rate and regular rhythm.  ?   Pulses: Normal pulses.  ?   Heart sounds: Normal heart sounds, S1 normal and S2 normal.  ?Pulmonary:  ?   Effort: Pulmonary effort is normal.  ?   Breath sounds: Normal breath sounds.  ?Skin: ?   General: Skin is warm and dry.  ?Neurological:  ?   Mental Status: She is alert.  ?   GCS: GCS eye subscore is 4. GCS verbal subscore is 5. GCS motor subscore is 6.  ?Psychiatric:     ?   Speech: Speech normal.     ?   Behavior: Behavior normal. Behavior is cooperative.  ? ? ?Assessment and Plan:  ? ?Generalized anxiety disorder; Moderate episode of recurrent major depressive disorder ([place]) ?Uncontrolled ?Patient denies SI/HI ?Stop Remeron 15 mg daily as this is not helping her and may be causing her weight gain ?Decrease Wellbutrin XL to 150 mg daily ?Start Celexa 10 mg daily  ?I advised patient that if they develop any SI, to tell someone immediately and seek medical attention ?Follow up in 3 months, sooner if concerns ? ? ?Attention deficit hyperactivity disorder (ADHD), combined type ?Uncontrolled ?No red flags upon database review  ?Stop Ritalin 20 mg daily, Start Vyvanse 70 mg daily  ?Informed patient that if paperwork completion is needed for financial assistance to reach out via MyChart ?Follow up in 3 months, sooner if concerns ? ? ?Insulin  controlled gestational diabetes mellitus (GDM) during pregnancy, antepartum ?HgbA1c 5.2% ?Recommended patient continue to monitor symptoms ?If persists, or new symptoms, we will get updated blood work at next visit ?Encouraged participation in healthy eating and regular exercise  ?Follow up if new/worsening symptoms or concerns occur ? ? ?I,Havlyn C Ratchford,acting as a scribe for [employer], PA.,have documented all relevant documentation on the behalf of Inda Coke, PA,as directed by  Inda Coke, PA while in the presence of Inda Coke, [place]. ? ? ?I, Inda Coke, PA, have  reviewed all documentation for this visit. The documentation on 12/28/21 for the exam, diagnosis, procedures, and orders are all accurate and complete. ? ? ?Inda Coke, [name] ? ?

## 2021-12-28 NOTE — Patient Instructions (Signed)
It was great to see you! ? ?Stop Ritalin, Start 70 mg Vyvanse daily ?If you need paperwork completed for this medication, please let me know ? ?Decrease Wellbutrin to 150 mg daily ?Start Celexa 10 mg daily ? ?Talk therapy referral placed ? ?A1c is 5.2. This is GREAT!  ? ?Let's follow-up in 3 months, sooner if you have concerns. ? ?If a referral was placed today, you will be contacted for an appointment. Please note that routine referrals can sometimes take up to 3-4 weeks to process. Please call our office if you haven't heard anything after this time frame. ? ?Take care, ? ?Inda Coke [name]  ?

## 2022-01-10 ENCOUNTER — Ambulatory Visit
Admission: EM | Admit: 2022-01-10 | Discharge: 2022-01-10 | Disposition: A | Discharge status: Home / Self Care | Attending: Emergency Medicine | Admitting: Emergency Medicine

## 2022-01-10 DIAGNOSIS — M62838 Other muscle spasm: Secondary | ICD-10-CM | POA: Diagnosis not present

## 2022-01-10 MED ORDER — NAPROXEN 500 MG PO TABS: 500.0000 mg | ORAL_TABLET | Freq: Two times a day (BID) | ORAL | 0 refills | Status: DC

## 2022-01-10 MED ORDER — DICLOFENAC SODIUM 1 % EX GEL: 4.0000 g | Freq: Four times a day (QID) | CUTANEOUS | 2 refills | Status: DC

## 2022-01-10 NOTE — ED Triage Notes (Signed)
Pt c/o neck pain, back pain (tightness), headache and discomfort to her right arm. ?

## 2022-01-10 NOTE — ED Provider Notes (Signed)
?[facility] ? ? ? ?CSN: [id number] ?Arrival date & time: 01/10/22  0845 ?  ? ?HISTORY  ? ?Chief Complaint  ?Patient presents with  ? Back Pain  ?  Pulled Neck and back pain - Entered by patient  ? ?HPI ?Morgan Chang is a 26 y.o. female. Patient presents to urgent care complaining of neck pain, back pain, headache and right arm discomfort, states this has been occurring intermittently for the past several weeks but in the past 2 days it is gotten significantly worse.  Patient states she has been taking naproxen and applying ice and heat to the active areas.  Patient reports a history of anxiety currently on Wellbutrin.  Vital signs are normal on arrival. ? ?The history is provided by the patient.  ?Past Medical History:  ?Diagnosis Date  ? Anemia 09/14/2021  ? Anxiety   ? Depression   ? Endometriosis   ? Fibroid   ? Frank breech presentation 10/29/2016  ? Gestational diabetes   ? Migraines   ? Pregnancy induced hypertension   ? S/P cesarean section 10/29/2016  ? ?Patient Active Problem List  ? Diagnosis Date Noted  ? Panniculitis 12/13/2021  ? History of gestational hypertension 09/14/2021  ? Anemia 09/14/2021  ? Attention deficit hyperactivity disorder (ADHD), 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  ? Gestational diabetes mellitus (GDM), antepartum 04/14/2021  ? Generalized anxiety disorder 03/11/2020  ? Moderate episode of recurrent major depressive disorder ([place]) 03/11/2020  ? Sleep difficulties 03/11/2020  ? ?Past Surgical History:  ?Procedure Laterality Date  ? ADENOIDECTOMY  10/29/2007  ? BREAST REDUCTION SURGERY    ? CESAREAN SECTION N/A 10/29/2016  ? Procedure: CESAREAN SECTION;  Surgeon: Janyth Contes, MD;  Location: [place];  Service: Obstetrics;  Laterality: N/A;  ? CESAREAN SECTION N/A 07/25/2021  ? Procedure: Repeat CESAREAN SECTION;  Surgeon: Aloha Gell, MD;  Location: [place] [facility];  Service: Obstetrics;  Laterality: N/A;   Repeat C/S BTL  ? LAPAROSCOPY  12/10/2014  ? NISSEN  FUNDOPLICATION    ? RHINOPLASTY  03/22/2010  ? deviated septum  ? TONSILLECTOMY  2017  ? TUBAL LIGATION  07/25/2021  ? ?OB History   ? ? Gravida  ?4  ? Para  ?2  ? Term  ?2  ? Preterm  ?   ? AB  ?2  ? Living  ?2  ?  ? ? SAB  ?2  ? IAB  ?   ? Ectopic  ?   ? Multiple  ?0  ? Live Births  ?1  ?   ?  ? Obstetric Comments  ?C/s for breech  ?  ? ?  ? ?Home Medications   ? ?Prior to Admission medications   ?Medication Sig Start Date End Date Taking? Authorizing Provider  ?buPROPion (WELLBUTRIN XL) 150 MG 24 hr tablet Take 1 tablet (150 mg total) by mouth daily. 12/28/21   Inda Coke, PA  ?citalopram (CELEXA) 10 MG tablet Take 1 tablet (10 mg total) by mouth daily. 12/28/21   Inda Coke, PA  ?lisdexamfetamine (VYVANSE) 70 MG capsule Take 1 capsule (70 mg total) by mouth daily. 12/28/21   Inda Coke, [place]  ?Magnesium Gluconate (MAGNESIUM 27 PO) Take by mouth.    [provider]  ?Prenatal MV-Min-FA-Omega-3 (PRENATAL GUMMIES/DHA & FA) 0.4-32.5 MG CHEW Chew 2 each by mouth at bedtime.    [provider]  ?VITAMIN D PO Take by mouth.    [provider]  ? ? ?Family History ?Family History  ?Problem  Relation Age of Onset  ? Heart disease Mother   ? Hypertension Mother   ? Anxiety disorder Mother   ? Depression Mother   ? Hypertension Father   ? Anxiety disorder Father   ? Depression Father   ? Testicular cancer Brother   ? ?Social History ?Social History  ? ?Tobacco Use  ? Smoking status: Never  ? Smokeless tobacco: Never  ?Vaping Use  ? Vaping Use: Never used  ?Substance Use Topics  ? Alcohol use: No  ? Drug use: No  ? ?Allergies   ?Patient has no known allergies. ? ?Review of Systems ?Review of Systems ?Pertinent findings noted in history of present illness.  ? ?Physical Exam ?Triage Vital Signs ?ED Triage Vitals  ?Enc Vitals Group  ?   BP 07/16/21 0827 (!) 147/82  ?   Pulse Rate 07/16/21 0827 72  ?   Resp 07/16/21 0827 18  ?   Temp 07/16/21 0827 98.3 ?F (36.8 ?C)  ?   Temp Source 07/16/21  0827 Oral  ?   SpO2 07/16/21 0827 98 %  ?   Weight --   ?   Height --   ?   Head Circumference --   ?   Peak Flow --   ?   Pain Score 07/16/21 0826 5  ?   Pain Loc --   ?   Pain Edu? --   ?   Excl. in [place]? --   ?No data found. ? ?Updated Vital Signs ?BP 103/69 (BP Location: Left Arm)   Pulse 75   Temp 98.2 ?F (36.8 ?C) (Oral)   Resp 18   LMP 12/23/2021 (Exact Date)   SpO2 97%  ? ?Physical Exam ?Vitals and nursing note reviewed.  ?Constitutional:   ?   General: She is not in acute distress. ?   Appearance: Normal appearance. She is not ill-appearing.  ?HENT:  ?   Head: Normocephalic and atraumatic.  ?Eyes:  ?   General: Lids are normal.     ?   Right eye: No discharge.     ?   Left eye: No discharge.  ?   Extraocular Movements: Extraocular movements intact.  ?   Conjunctiva/sclera: Conjunctivae normal.  ?   Right eye: Right conjunctiva is not injected.  ?   Left eye: Left conjunctiva is not injected.  ?Neck:  ?   Trachea: Trachea and phonation normal.  ?Cardiovascular:  ?   Rate and Rhythm: Normal rate and regular rhythm.  ?   Pulses: Normal pulses.  ?   Heart sounds: Normal heart sounds. No murmur heard. ?  No friction rub. No gallop.  ?Pulmonary:  ?   Effort: Pulmonary effort is normal. No accessory muscle usage, prolonged expiration or respiratory distress.  ?   Breath sounds: Normal breath sounds. No stridor, decreased air movement or transmitted upper airway sounds. No decreased breath sounds, wheezing, rhonchi or rales.  ?Chest:  ?   Chest wall: No tenderness.  ?Musculoskeletal:     ?   General: Tenderness (Left SCM) present. Normal range of motion.  ?   Cervical back: Normal range of motion and neck supple. Normal range of motion.  ?Lymphadenopathy:  ?   Cervical: No cervical adenopathy.  ?Skin: ?   General: Skin is warm and dry.  ?   Findings: No erythema or rash.  ?Neurological:  ?   General: No focal deficit present.  ?   Mental Status: She is alert and oriented to person,  place, and time.   ?Psychiatric:     ?   Mood and Affect: Mood normal.     ?   Behavior: Behavior normal.  ? ? ?Visual Acuity ?Right Eye Distance:   ?Left Eye Distance:   ?Bilateral Distance:   ? ?Right Eye Near:   ?Left Eye Near:    ?Bilateral Near:    ? ?UC Couse / Diagnostics / Procedures:  ?  ?EKG ? ?Radiology ?No results found. ? ?Procedures ?Procedures (including critical care time) ? ?UC Diagnoses / Final Clinical Impressions(s)   ?I have reviewed the triage vital signs and the nursing notes. ? ?Pertinent labs & imaging results that were available during my care of the patient were reviewed by me and considered in my medical decision making (see chart for details).   ? ?Final diagnoses:  ?Cervical paraspinous muscle spasm  ? ? ?Patient was advised that I recommend conservative care at home.  Patient provided with information regarding self-care.  Patient provided with prescriptions for naproxen and diclofenac gel.  Return precautions advised. ? ?ED Prescriptions   ? ? Medication Sig Dispense Auth. Provider  ? diclofenac Sodium (VOLTAREN) 1 % GEL Apply 4 g topically 4 (four) times daily. Apply to affected areas 4 times daily as needed for pain. 100 g Lynden Oxford Scales, [name]  ? naproxen (NAPROSYN) 500 MG tablet Take 1 tablet (500 mg total) by mouth 2 (two) times daily. 30 tablet Lynden Oxford Scales, [name]  ? ?  ? ?PDMP not reviewed this encounter. ? ?Pending results:  ?Labs Reviewed - No data to display ? ?Medications Ordered in UC: ?Medications - No data to display ? ?Disposition Upon Discharge:  ?Condition: stable for discharge home ?Home: take medications as prescribed; routine discharge instructions as discussed; follow up as advised. ? ?Patient presented with an acute illness with associated systemic symptoms and significant discomfort requiring urgent management. In my opinion, this is a condition that a prudent lay person (someone who possesses an average knowledge of health and medicine) may potentially expect to  result in complications if not addressed urgently such as respiratory distress, impairment of bodily function or dysfunction of bodily organs.  ? ?Routine symptom specific, illness specific and/or disease specific instruction

## 2022-01-10 NOTE — Discharge Instructions (Signed)
Please read the enclosed information for how to care for muscle cramps and spasms at home. ? ?I provided you with a prescription for naproxen and diclofenac gel.  Naproxen can be taken twice daily and diclofenac gel can be used up to 4 times daily.  Gentle stretching, yoga, deep relaxation techniques recommended as well. ? ?Thank you for visiting urgent care today. ?

## 2022-02-05 ENCOUNTER — Other Ambulatory Visit: Payer: Self-pay | Admitting: Physician Assistant

## 2022-02-07 MED ORDER — LISDEXAMFETAMINE DIMESYLATE 70 MG PO CAPS: 70.0000 mg | ORAL_CAPSULE | Freq: Every day | ORAL | 0 refills | Status: DC

## 2022-02-07 NOTE — Telephone Encounter (Signed)
Pt requesting refill for Vyvanse 70 mg. Last OV 12/2021, scheduled for 03/2022.

## 2022-02-15 ENCOUNTER — Encounter: Payer: Self-pay | Admitting: Physician Assistant

## 2022-02-16 NOTE — Telephone Encounter (Signed)
Message sent to [facility] to send referral to another office.

## 2022-02-25 ENCOUNTER — Encounter: Payer: Self-pay | Admitting: Plastic Surgery

## 2022-03-10 ENCOUNTER — Other Ambulatory Visit: Payer: Self-pay | Admitting: Physician Assistant

## 2022-03-10 MED ORDER — LISDEXAMFETAMINE DIMESYLATE 70 MG PO CAPS: 70.0000 mg | ORAL_CAPSULE | Freq: Every day | ORAL | 0 refills | Status: DC

## 2022-03-10 NOTE — Telephone Encounter (Signed)
Pt requesting refill on Vyvanse. Last OV 12/28/2021.

## 2022-03-11 ENCOUNTER — Telehealth: Payer: Self-pay | Admitting: Physician Assistant

## 2022-03-18 ENCOUNTER — Telehealth: Admitting: Physician Assistant

## 2022-03-24 ENCOUNTER — Ambulatory Visit

## 2022-03-25 ENCOUNTER — Telehealth: Payer: Self-pay | Admitting: Physician Assistant

## 2022-03-25 ENCOUNTER — Telehealth (INDEPENDENT_AMBULATORY_CARE_PROVIDER_SITE_OTHER): Admitting: Physician Assistant

## 2022-03-25 ENCOUNTER — Encounter: Payer: Self-pay | Admitting: Physician Assistant

## 2022-03-25 ENCOUNTER — Ambulatory Visit (HOSPITAL_COMMUNITY)

## 2022-03-25 VITALS — Ht 66.0 in | Wt 198.0 lb

## 2022-03-25 DIAGNOSIS — M542 Cervicalgia: Secondary | ICD-10-CM | POA: Diagnosis not present

## 2022-03-25 DIAGNOSIS — N62 Hypertrophy of breast: Secondary | ICD-10-CM | POA: Diagnosis not present

## 2022-03-25 DIAGNOSIS — R6889 Other general symptoms and signs: Secondary | ICD-10-CM

## 2022-03-25 DIAGNOSIS — F902 Attention-deficit hyperactivity disorder, combined type: Secondary | ICD-10-CM

## 2022-03-25 MED ORDER — LISDEXAMFETAMINE DIMESYLATE 70 MG PO CAPS: 70.0000 mg | ORAL_CAPSULE | Freq: Every day | ORAL | 0 refills | Status: DC

## 2022-03-25 NOTE — Telephone Encounter (Signed)
Pt states she was instructed to call [facility] to set up a lab appointment.  No active orders were found at time of call.       Please route back to [place] pool for any needed scheduling follow up

## 2022-03-25 NOTE — Telephone Encounter (Signed)
Orders are in the chart listed as future.

## 2022-03-25 NOTE — Progress Notes (Signed)
I acted as a Education administrator for [employer], [name] Anselmo Pickler, LPN  Virtual Visit via Video Note   I, Inda Coke PA, connected with  Morgan Chang  ([id number], 03-07-1996) on 03/25/22 at  1:20 PM EDT by a video-enabled telemedicine application and verified that I am speaking with the correct person using two identifiers.  Location: Patient: Home Provider: [place] office   I discussed the limitations of evaluation and management by telemedicine and the availability of in person appointments. The patient expressed understanding and agreed to proceed.    History of Present Illness: Morgan Chang is a 26 y.o. who identifies as a female who was assigned female at birth, and is being seen today.  Neck pain Pt c/o neck pain x 2-3 months, she thinks headaches are being caused from her neck pain, which is caused by her heavy breasts. She has been taking Ibuprofen, Naproxen, Tylenol and Voltaren gel no relief. She is having headaches everyday at base of neck. She is interested in a breast reduction.  [place]  [employer] intolerance Pt c/o sweating profusely without even doing anything, has been happening since Nov. She initially thought that this was related to post-partum status but it is continuing to happen. Does not feel like it is related to her medications -- vyvanse, wellbutrin, celexa. Denies night sweats. Is trying to lose weight and has been successful with approx 20 lb in the last 3 months. Denies increased thirst or urination.  ADHD Currently taking Vyvanse 70 mg daily. Tolerating well. Denies insomnia, palpitation, chest pain. Feels like this improves her focus.  Problems:  Patient Active Problem List   Diagnosis Date Noted   Panniculitis 12/13/2021   History of gestational hypertension 09/14/2021   Anemia 09/14/2021   Attention deficit hyperactivity disorder (ADHD), 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   Gestational diabetes mellitus (GDM), antepartum  04/14/2021   Generalized anxiety disorder 03/11/2020   Moderate episode of recurrent major depressive disorder ([place]) 03/11/2020   Sleep difficulties 03/11/2020    Allergies: No Known Allergies Medications:  Current Outpatient Medications:    buPROPion (WELLBUTRIN XL) 150 MG 24 hr tablet, Take 1 tablet (150 mg total) by mouth daily., Disp: 90 tablet, Rfl: 1   citalopram (CELEXA) 10 MG tablet, Take 1 tablet (10 mg total) by mouth daily., Disp: 90 tablet, Rfl: 1   diclofenac Sodium (VOLTAREN) 1 % GEL, Apply 4 g topically 4 (four) times daily. Apply to affected areas 4 times daily as needed for pain., Disp: 100 g, Rfl: 2   lisdexamfetamine (VYVANSE) 70 MG capsule, Take 1 capsule (70 mg total) by mouth daily., Disp: 30 capsule, Rfl: 0   naproxen (NAPROSYN) 500 MG tablet, Take 1 tablet (500 mg total) by mouth 2 (two) times daily., Disp: 30 tablet, Rfl: 0  Observations/Objective: Patient is well-developed, well-nourished in no acute distress.  Resting comfortably  at home.  Head is normocephalic, atraumatic.  No labored breathing.  Speech is clear and coherent with logical content.  Patient is alert and oriented at baseline.    Assessment and Plan: Neck pain; Macromastia No red flags Requesting referral to cosmetic surgery -- referral placed Recommend consideration of PT -- she declined Continue supportive care  Heat intolerance Unclear etiology however could definitely still be related to post-partum status Will update blood work -- future orders placed-- to rule out blood sugar, thyroid or other causes Continue to monitor and stay well hydrated Also continue to trend weight and follow-up if any  unintentional weight loss  ADHD Well controlled Continue vyvanse 70 mg daily PDMP reviewed -- no red flags Follow-up in 6 months, sooner if concerns   Follow Up Instructions: I discussed the assessment and treatment plan with the patient. The patient was provided an opportunity to ask  questions and all were answered. The patient agreed with the plan and demonstrated an understanding of the instructions.  A copy of instructions were sent to the patient via MyChart unless otherwise noted below.   The patient was advised to call back or seek an in-person evaluation if the symptoms worsen or if the condition fails to improve as anticipated.  Inda Coke, [place]

## 2022-03-29 ENCOUNTER — Ambulatory Visit: Admitting: Physician Assistant

## 2022-03-30 ENCOUNTER — Other Ambulatory Visit (INDEPENDENT_AMBULATORY_CARE_PROVIDER_SITE_OTHER)

## 2022-03-30 DIAGNOSIS — R6889 Other general symptoms and signs: Secondary | ICD-10-CM | POA: Diagnosis not present

## 2022-03-30 LAB — COMPREHENSIVE METABOLIC PANEL
ALT: 11 U/L (ref 0–35)
AST: 15 U/L (ref 0–37)
Albumin: 4.8 g/dL (ref 3.5–5.2)
Alkaline Phosphatase: 60 U/L (ref 39–117)
BUN: 17 mg/dL (ref 6–23)
CO2: 26 mEq/L (ref 19–32)
Calcium: 9.6 mg/dL (ref 8.4–10.5)
Chloride: 104 mEq/L (ref 96–112)
Creatinine, Ser: 0.82 mg/dL (ref 0.40–1.20)
GFR: 97.63 mL/min (ref >60.00)
Glucose, Bld: 92 mg/dL (ref 70–99)
Potassium: 4.1 mEq/L (ref 3.5–5.1)
Sodium: 138 mEq/L (ref 135–145)
Total Bilirubin: 0.4 mg/dL (ref 0.2–1.2)
Total Protein: 7.2 g/dL (ref 6.0–8.3)

## 2022-03-30 LAB — HEMOGLOBIN A1C: Hgb A1c MFr Bld: 5.4 % (ref 4.6–6.5)

## 2022-03-30 LAB — CBC WITH DIFFERENTIAL/PLATELET
Basophils Absolute: 0.1 10*3/uL (ref 0.0–0.1)
Basophils Relative: 0.8 % (ref 0.0–3.0)
Eosinophils Absolute: 0.1 10*3/uL (ref 0.0–0.7)
Eosinophils Relative: 1.6 % (ref 0.0–5.0)
HCT: 40.3 % (ref 36.0–46.0)
Hemoglobin: 13.5 g/dL (ref 12.0–15.0)
Lymphocytes Relative: 36.6 % (ref 12.0–46.0)
Lymphs Abs: 2.5 10*3/uL (ref 0.7–4.0)
MCHC: 33.5 g/dL (ref 30.0–36.0)
MCV: 89.5 fl (ref 78.0–100.0)
Monocytes Absolute: 0.6 10*3/uL (ref 0.1–1.0)
Monocytes Relative: 8.3 % (ref 3.0–12.0)
Neutro Abs: 3.7 10*3/uL (ref 1.4–7.7)
Neutrophils Relative %: 52.7 % (ref 43.0–77.0)
Platelets: 277 10*3/uL (ref 150.0–400.0)
RBC: 4.5 Mil/uL (ref 3.87–5.11)
RDW: 13.5 % (ref 11.5–15.5)
WBC: 6.9 10*3/uL (ref 4.0–10.5)

## 2022-03-30 LAB — TSH: TSH: 1.34 u[IU]/mL (ref 0.35–5.50)

## 2022-04-08 DIAGNOSIS — F430 Acute stress reaction: Secondary | ICD-10-CM | POA: Insufficient documentation

## 2022-04-14 ENCOUNTER — Other Ambulatory Visit: Payer: Self-pay | Admitting: Physician Assistant

## 2022-04-15 MED ORDER — LISDEXAMFETAMINE DIMESYLATE 70 MG PO CAPS: 70.0000 mg | ORAL_CAPSULE | Freq: Every day | ORAL | 0 refills | Status: DC

## 2022-04-15 NOTE — Telephone Encounter (Signed)
Last OV: 03/25/22  Next OV: none scheduled  Last filled: 03/25/22  Quantity: 30

## 2022-04-19 ENCOUNTER — Ambulatory Visit
Admission: RE | Admit: 2022-04-19 | Discharge: 2022-04-19 | Disposition: A | Discharge status: Home / Self Care | Source: Ambulatory Visit | Attending: Urgent Care | Admitting: Urgent Care

## 2022-04-19 VITALS — BP 131/88 | HR 62 | Temp 99.0°F | Resp 20

## 2022-04-19 DIAGNOSIS — R300 Dysuria: Secondary | ICD-10-CM | POA: Diagnosis not present

## 2022-04-19 DIAGNOSIS — B3731 Acute candidiasis of vulva and vagina: Secondary | ICD-10-CM | POA: Diagnosis not present

## 2022-04-19 DIAGNOSIS — N898 Other specified noninflammatory disorders of vagina: Secondary | ICD-10-CM | POA: Diagnosis not present

## 2022-04-19 DIAGNOSIS — N3001 Acute cystitis with hematuria: Secondary | ICD-10-CM | POA: Insufficient documentation

## 2022-04-19 LAB — POCT URINALYSIS DIP (MANUAL ENTRY)
Bilirubin, UA: NEGATIVE
Glucose, UA: NEGATIVE mg/dL
Ketones, POC UA: NEGATIVE mg/dL
Nitrite, UA: NEGATIVE
Protein Ur, POC: 30 mg/dL — AB
Spec Grav, UA: >=1.03 — AB (ref 1.010–1.025)
Urobilinogen, UA: 1 E.U./dL
pH, UA: 6.5 (ref 5.0–8.0)

## 2022-04-19 MED ORDER — FLUCONAZOLE 150 MG PO TABS: 150.0000 mg | ORAL_TABLET | ORAL | 0 refills | Status: DC

## 2022-04-19 MED ORDER — CEPHALEXIN 500 MG PO CAPS: 500.0000 mg | ORAL_CAPSULE | Freq: Two times a day (BID) | ORAL | 0 refills | Status: DC

## 2022-04-19 NOTE — ED Triage Notes (Signed)
Pt here with vaginal burning and irritation x 1 week with burning during urination as well. Pt also states she has had vaginal discharge that is thick and white. Pt may have some risk for STI.

## 2022-04-19 NOTE — ED Provider Notes (Signed)
[place]   MRN: [id number] DOB: 1996/05/28  Subjective:   Morgan Chang is a 26 y.o. female presenting for 1 week history of acute onset persistent vaginal irritation and discharge, dysuria and urinary frequency.  Patient has a history of yeast infections.  Would like to make sure she does not have a sexually transmitted infection.  She tries to hydrate well with at least 96 ounces of water daily.  She does drink sodas daily.  Patient has a history of tubal ligation.  No current facility-administered medications for this encounter.  Current Outpatient Medications:    buPROPion (WELLBUTRIN XL) 150 MG 24 hr tablet, Take 1 tablet (150 mg total) by mouth daily., Disp: 90 tablet, Rfl: 1   citalopram (CELEXA) 10 MG tablet, Take 1 tablet (10 mg total) by mouth daily., Disp: 90 tablet, Rfl: 1   diclofenac Sodium (VOLTAREN) 1 % GEL, Apply 4 g topically 4 (four) times daily. Apply to affected areas 4 times daily as needed for pain., Disp: 100 g, Rfl: 2   [START ON 05/24/2022] lisdexamfetamine (VYVANSE) 70 MG capsule, Take 1 capsule (70 mg total) by mouth daily before breakfast., Disp: 30 capsule, Rfl: 0   lisdexamfetamine (VYVANSE) 70 MG capsule, Take 1 capsule (70 mg total) by mouth daily before breakfast., Disp: 30 capsule, Rfl: 0   naproxen (NAPROSYN) 500 MG tablet, Take 1 tablet (500 mg total) by mouth 2 (two) times daily., Disp: 30 tablet, Rfl: 0   No Known Allergies  Past Medical History:  Diagnosis Date   Anemia 09/14/2021   Anxiety    Depression    Endometriosis    Fibroid    Frank breech presentation 10/29/2016   Gestational diabetes    Migraines    Pregnancy induced hypertension    S/P cesarean section 10/29/2016     Past Surgical History:  Procedure Laterality Date   ADENOIDECTOMY  10/29/2007   BREAST REDUCTION SURGERY     CESAREAN SECTION N/A 10/29/2016   Procedure: CESAREAN SECTION;  Surgeon: Janyth Contes, MD;  Location: [place];  Service: Obstetrics;  Laterality: N/A;   CESAREAN SECTION N/A 07/25/2021   Procedure: Repeat CESAREAN SECTION;  Surgeon: Aloha Gell, MD;  Location: [place] [facility];  Service: Obstetrics;  Laterality: N/A;   Repeat C/S BTL   LAPAROSCOPY  28/31/5176   NISSEN FUNDOPLICATION     RHINOPLASTY  03/22/2010   deviated septum   TONSILLECTOMY  2017   TUBAL LIGATION  07/25/2021    Family History  Problem Relation Age of Onset   Heart disease Mother    Hypertension Mother    Anxiety disorder Mother    Depression Mother    Hypertension Father    Anxiety disorder Father    Depression Father    Testicular cancer Brother     Social History   Tobacco Use   Smoking status: Never   Smokeless tobacco: Never  Vaping Use   Vaping Use: Never used  Substance Use Topics   Alcohol use: No   Drug use: No    ROS   Objective:   Vitals: BP 131/88   Pulse 62   Temp 99 F (37.2 C)   Resp 20   LMP 03/18/2022 (Exact Date)   SpO2 98%   Physical Exam Constitutional:      General: She is not in acute distress.    Appearance: Normal appearance. She is well-developed. She is not ill-appearing, toxic-appearing or diaphoretic.  HENT:  Head: Normocephalic and atraumatic.     Nose: Nose normal.     Mouth/Throat:     Mouth: Mucous membranes are moist.  Eyes:     General: No scleral icterus.       Right eye: No discharge.        Left eye: No discharge.     Extraocular Movements: Extraocular movements intact.     Conjunctiva/sclera: Conjunctivae normal.  Cardiovascular:     Rate and Rhythm: Normal rate.  Pulmonary:     Effort: Pulmonary effort is normal.  Abdominal:     General: Bowel sounds are normal. There is no distension.     Palpations: Abdomen is soft. There is no mass.     Tenderness: There is no abdominal tenderness. There is no right CVA tenderness, left CVA tenderness, guarding or rebound.  Skin:    General: Skin is warm and dry.  Neurological:     General: No focal  deficit present.     Mental Status: She is alert and oriented to person, place, and time.  Psychiatric:        Mood and Affect: Mood normal.        Behavior: Behavior normal.        Thought Content: Thought content normal.        Judgment: Judgment normal.     Results for orders placed or performed during the hospital encounter of 04/19/22 (from the past 24 hour(s))  POCT urinalysis dipstick     Status: Abnormal   Collection Time: 04/19/22  5:58 PM  Result Value Ref Range   Color, UA yellow yellow   Clarity, UA cloudy (A) clear   Glucose, UA negative negative mg/dL   Bilirubin, UA negative negative   Ketones, POC UA negative negative mg/dL   Spec Grav, UA >=1.030 (A) 1.010 - 1.025   Blood, UA large (A) negative   pH, UA 6.5 5.0 - 8.0   Protein Ur, POC =30 (A) negative mg/dL   Urobilinogen, UA 1.0 0.2 or 1.0 E.U./dL   Nitrite, UA Negative Negative   Leukocytes, UA Small (1+) (A) Negative    Assessment and Plan :   PDMP not reviewed this encounter.  1. Acute cystitis with hematuria   2. Dysuria   3. Vaginal itching   4. Vaginal discharge   5. Yeast vaginitis    Start Keflex to cover for acute cystitis, urine culture pending.  We will also cover for yeast vaginitis with fluconazole.  Recommended aggressive hydration, limiting urinary irritants. Counseled patient on potential for adverse effects with medications prescribed/recommended today, ER and return-to-clinic precautions discussed, patient verbalized understanding.    Jaynee Eagles, [place] 04/19/22 [id number]

## 2022-04-19 NOTE — Discharge Instructions (Addendum)
Please start Keflex to address an urinary tract infection. Make sure you hydrate very well with plain water and a quantity of 80 ounces of water a day.  Please limit drinks that are considered urinary irritants such as soda, sweet tea, coffee, energy drinks, alcohol.  These can worsen your urinary and genital symptoms but also be the source of them.  I will let you know about your urine culture results through MyChart to see if we need to prescribe or change your antibiotics based off of those results.  

## 2022-04-20 ENCOUNTER — Telehealth: Payer: Self-pay

## 2022-04-20 ENCOUNTER — Encounter: Payer: Self-pay | Admitting: Physician Assistant

## 2022-04-20 LAB — CERVICOVAGINAL ANCILLARY ONLY
Bacterial Vaginitis (gardnerella): POSITIVE — AB
Candida Glabrata: NEGATIVE
Candida Vaginitis: NEGATIVE
Chlamydia: NEGATIVE
Comment: NEGATIVE
Comment: NEGATIVE
Comment: NEGATIVE
Comment: NEGATIVE
Comment: NEGATIVE
Comment: NORMAL
Neisseria Gonorrhea: NEGATIVE
Trichomonas: NEGATIVE

## 2022-04-20 MED ORDER — METRONIDAZOLE 500 MG PO TABS: 500.0000 mg | ORAL_TABLET | Freq: Two times a day (BID) | ORAL | 0 refills | Status: DC

## 2022-04-20 NOTE — Telephone Encounter (Signed)
Attempt to return patient call but no answer and voicemail is full. Provider made aware and prescription sent.

## 2022-04-20 NOTE — Telephone Encounter (Signed)
Flagyl prescribed for treatment of BV.

## 2022-04-21 ENCOUNTER — Other Ambulatory Visit (HOSPITAL_COMMUNITY): Payer: Self-pay

## 2022-04-21 LAB — URINE CULTURE: Culture: >=100000 — AB

## 2022-04-21 MED ORDER — LISDEXAMFETAMINE DIMESYLATE 70 MG PO CAPS
70.0000 mg | ORAL_CAPSULE | Freq: Every day | ORAL | 0 refills | Status: DC
Start: 2022-05-24 — End: 2022-04-26

## 2022-04-23 ENCOUNTER — Telehealth: Payer: Self-pay

## 2022-04-23 MED ORDER — NITROFURANTOIN MONOHYD MACRO 100 MG PO CAPS: 100.0000 mg | ORAL_CAPSULE | Freq: Two times a day (BID) | ORAL | 0 refills | Status: AC

## 2022-04-25 NOTE — Telephone Encounter (Signed)
Duplicate

## 2022-04-25 NOTE — Telephone Encounter (Signed)
Pt has called in stating, as in her previous msg, she cannot find any pharmacy that is not on back order for her rx. She is asking if there is an alternative or what else she can do. Please advise

## 2022-04-26 ENCOUNTER — Other Ambulatory Visit: Payer: Self-pay | Admitting: Physician Assistant

## 2022-04-26 ENCOUNTER — Telehealth: Payer: Self-pay | Admitting: Physician Assistant

## 2022-04-26 MED ORDER — AMPHETAMINE-DEXTROAMPHET ER 30 MG PO CP24: 30.0000 mg | ORAL_CAPSULE | ORAL | 0 refills | Status: DC

## 2022-04-26 NOTE — Telephone Encounter (Signed)
Discussed with Aldona Bar, she said pt can contact [facility].

## 2022-04-26 NOTE — Telephone Encounter (Signed)
Patient called stating [facility] does not have an available appointment any time soon and would like to be seen somewhere else so that she can be treated sooner.

## 2022-04-26 NOTE — Telephone Encounter (Signed)
Spoke to pt told her Aldona Bar said you can contact [facility] to see if they can see you sooner. Pt verbalized understanding.

## 2022-05-03 ENCOUNTER — Ambulatory Visit: Admitting: Physician Assistant

## 2022-06-03 ENCOUNTER — Encounter: Payer: Self-pay | Admitting: Physician Assistant

## 2022-06-03 ENCOUNTER — Ambulatory Visit (INDEPENDENT_AMBULATORY_CARE_PROVIDER_SITE_OTHER): Admitting: Physician Assistant

## 2022-06-03 VITALS — BP 102/80 | HR 76 | Temp 98.0°F | Ht 66.0 in | Wt 185.5 lb

## 2022-06-03 DIAGNOSIS — R300 Dysuria: Secondary | ICD-10-CM | POA: Diagnosis not present

## 2022-06-03 DIAGNOSIS — N3941 Urge incontinence: Secondary | ICD-10-CM | POA: Diagnosis not present

## 2022-06-03 DIAGNOSIS — K644 Residual hemorrhoidal skin tags: Secondary | ICD-10-CM | POA: Diagnosis not present

## 2022-06-03 DIAGNOSIS — N390 Urinary tract infection, site not specified: Secondary | ICD-10-CM

## 2022-06-03 LAB — POCT URINALYSIS DIPSTICK
Bilirubin, UA: NEGATIVE
Blood, UA: NEGATIVE
Glucose, UA: NEGATIVE
Ketones, UA: NEGATIVE
Nitrite, UA: NEGATIVE
Protein, UA: NEGATIVE
Spec Grav, UA: 1.01 (ref 1.010–1.025)
Urobilinogen, UA: 0.2 E.U./dL
pH, UA: 6.5 (ref 5.0–8.0)

## 2022-06-03 MED ORDER — HYDROCORTISONE (PERIANAL) 2.5 % EX CREA: TOPICAL_CREAM | CUTANEOUS | 0 refills | Status: DC

## 2022-06-03 MED ORDER — NITROFURANTOIN MONOHYD MACRO 100 MG PO CAPS: 100.0000 mg | ORAL_CAPSULE | Freq: Two times a day (BID) | ORAL | 0 refills | Status: DC

## 2022-06-03 NOTE — Patient Instructions (Signed)
It was great to see you!  Start macrobid Referral to urology  Trial rectal cream Referral to GI  Flu shot today  General instructions Make sure you: Pee until your bladder is empty. Do not hold pee for a long time. Empty your bladder after sex. Wipe from front to back after pooping if you are a female. Use each tissue one time when you wipe. Drink enough fluid to keep your pee pale yellow. Keep all follow-up visits as told by your doctor. This is important. Contact a doctor if: You do not get better after 1-2 days. Your symptoms go away and then come back. Get help right away if: You have very bad back pain. You have very bad pain in your lower belly. You have a fever. You are sick to your stomach (nauseous). You are throwing up.   Take care,  Inda Coke [name]

## 2022-06-03 NOTE — Progress Notes (Signed)
Morgan Chang is a 26 y.o. female here for a follow up of a pre-existing problem.  History of Present Illness:   Chief Complaint  Patient presents with  . Dysuria    Pt c/o urinary frequency started a week ago, yesterday started having left flank pain and burning & pain with urination. Denies fever, but his having chills.    HPI  UTI One week ago developed urinary frequency Then developed left flank pain and burning with urination Having chills, no fever  She went to urgent care in August and had UTI and BV. Was initially prescribed keflex but then switched to macrobid based on culture. She also took flagyl for this.  Has had changes to urination since birth of daughter Having urge incontinence at times   External hemorrhoid She has had issues with her hemorrhoids since childbirth She is wanting these to be further evaluated and possibly removed She does get some bleeding at times  Past Medical History:  Diagnosis Date  . Anemia 09/14/2021  . Anxiety   . Depression   . Endometriosis   . Fibroid   . Frank breech presentation 10/29/2016  . Gestational diabetes   . Migraines   . Pregnancy induced hypertension   . S/P cesarean section 10/29/2016     Social History   Tobacco Use  . Smoking status: Never  . Smokeless tobacco: Never  Vaping Use  . Vaping Use: Never used  Substance Use Topics  . Alcohol use: No  . Drug use: No    Past Surgical History:  Procedure Laterality Date  . ADENOIDECTOMY  10/29/2007  . BREAST REDUCTION SURGERY    . CESAREAN SECTION N/A 10/29/2016   Procedure: CESAREAN SECTION;  Surgeon: Janyth Contes, MD;  Location: [facility];  Service: Obstetrics;  Laterality: N/A;  . CESAREAN SECTION N/A 07/25/2021   Procedure: Repeat CESAREAN SECTION;  Surgeon: Aloha Gell, MD;  Location: [facility];  Service: Obstetrics;  Laterality: N/A;   Repeat C/S BTL  . LAPAROSCOPY  12/10/2014  . NISSEN FUNDOPLICATION    . RHINOPLASTY   03/22/2010   deviated septum  . TONSILLECTOMY  2017  . TUBAL LIGATION  07/25/2021    Family History  Problem Relation Age of Onset  . Heart disease Mother   . Hypertension Mother   . Anxiety disorder Mother   . Depression Mother   . Hypertension Father   . Anxiety disorder Father   . Depression Father   . Testicular cancer Brother     No Known Allergies  Current Medications:   Current Outpatient Medications:  .  amphetamine-dextroamphetamine (ADDERALL XR) 30 MG 24 hr capsule, Take 1 capsule (30 mg total) by mouth every morning., Disp: 30 capsule, Rfl: 0 .  buPROPion (WELLBUTRIN XL) 150 MG 24 hr tablet, Take 1 tablet (150 mg total) by mouth daily., Disp: 90 tablet, Rfl: 1 .  busPIRone (BUSPAR) 7.5 MG tablet, Take 7.5 mg by mouth 2 (two) times daily., Disp: , Rfl:  .  hydrocortisone (ANUSOL-HC) 2.5 % rectal cream, Apply to the affected area 1-2 times daily, Disp: 30 g, Rfl: 0 .  nitrofurantoin, macrocrystal-monohydrate, (MACROBID) 100 MG capsule, Take 1 capsule (100 mg total) by mouth 2 (two) times daily., Disp: 10 capsule, Rfl: 0 .  propranolol (INDERAL) 10 MG tablet, Take 10 mg by mouth 2 (two) times daily., Disp: , Rfl:  .  QUEtiapine (SEROQUEL) 100 MG tablet, Take 100 mg by mouth at bedtime., Disp: , Rfl:    Review  of Systems:   ROS Negative unless otherwise specified per HPI.  Vitals:   Vitals:   06/03/22 1323  BP: 102/80  Pulse: 76  Temp: 98 F (36.7 C)  TempSrc: Temporal  SpO2: 100%  Weight: 185 lb 8 oz (84.1 kg)  Height: '5\' 6"'$  (1.676 m)     Body mass index is 29.94 kg/m.  Physical Exam:   Physical Exam Vitals and nursing note reviewed.  Constitutional:      General: She is not in acute distress.    Appearance: She is well-developed. She is not ill-appearing or toxic-appearing.  Cardiovascular:     Rate and Rhythm: Normal rate and regular rhythm.     Pulses: Normal pulses.     Heart sounds: Normal heart sounds, S1 normal and S2 normal.  Pulmonary:      Effort: Pulmonary effort is normal.     Breath sounds: Normal breath sounds.  Abdominal:     Tenderness: There is no right CVA tenderness or left CVA tenderness.  Skin:    General: Skin is warm and dry.  Neurological:     Mental Status: She is alert.     GCS: GCS eye subscore is 4. GCS verbal subscore is 5. GCS motor subscore is 6.  Psychiatric:        Speech: Speech normal.        Behavior: Behavior normal. Behavior is cooperative.    Results for orders placed or performed in visit on 06/03/22  POCT urinalysis dipstick  Result Value Ref Range   Color, UA yellow    Clarity, UA cloudy    Glucose, UA Negative Negative   Bilirubin, UA Negative    Ketones, UA Negative    Spec Grav, UA 1.010 1.010 - 1.025   Blood, UA Negative    pH, UA 6.5 5.0 - 8.0   Protein, UA Negative Negative   Urobilinogen, UA 0.2 0.2 or 1.0 E.U./dL   Nitrite, UA Negative    Leukocytes, UA Small (1+) (A) Negative   Appearance     Odor       Assessment and Plan:   Dysuria No red flags on discussion Will treat based on prior urine culture result as current urine does have leukocytes Will treat with Macrobid 100 mg twice daily x5 days Urine culture pending Worsened precautions advised  External hemorrhoid This was not personally evaluated I did prescribe Anusol rectal cream for her to use as needed Gastro enterology referral placed for further evaluation  Recurrent UTI; Urge incontinence Referral to urology for further evaluation of recurrent UTIs and urge incontinence Would likely benefit from pelvic floor PT but will start initial eval with urology    Inda Coke, [name]

## 2022-06-05 LAB — URINE CULTURE
MICRO NUMBER:: 13923953
SPECIMEN QUALITY:: ADEQUATE

## 2022-06-06 ENCOUNTER — Encounter: Payer: Self-pay | Admitting: Physician Assistant

## 2022-06-13 ENCOUNTER — Encounter: Payer: Self-pay | Admitting: *Deleted

## 2022-06-13 ENCOUNTER — Telehealth: Payer: Self-pay | Admitting: Plastic Surgery

## 2022-06-13 NOTE — Telephone Encounter (Signed)
LVM for patient to call and reschedule 06/27/22 appt due to provider unable to be in clinic that day.

## 2022-06-22 NOTE — Progress Notes (Deleted)
06/24/2022 5:05 PM   Morgan Chang 25-Jun-1996 [id number]  Referring provider: Inda Coke, [place],  [place] [address]  No chief complaint on file.   HPI:    PMH: Past Medical History:  Diagnosis Date   Anemia 09/14/2021   Anxiety    Depression    Endometriosis    Fibroid    Frank breech presentation 10/29/2016   Gestational diabetes    Migraines    Pregnancy induced hypertension    S/P cesarean section 10/29/2016    Surgical History: Past Surgical History:  Procedure Laterality Date   ADENOIDECTOMY  10/29/2007   BREAST REDUCTION SURGERY     CESAREAN SECTION N/A 10/29/2016   Procedure: CESAREAN SECTION;  Surgeon: Janyth Contes, MD;  Location: [facility];  Service: Obstetrics;  Laterality: N/A;   CESAREAN SECTION N/A 07/25/2021   Procedure: Repeat CESAREAN SECTION;  Surgeon: Aloha Gell, MD;  Location: [facility];  Service: Obstetrics;  Laterality: N/A;   Repeat C/S BTL   LAPAROSCOPY  07/16/2535   NISSEN FUNDOPLICATION     RHINOPLASTY  03/22/2010   deviated septum   TONSILLECTOMY  2017   TUBAL LIGATION  07/25/2021    Home Medications:  Allergies as of 06/24/2022   No Known Allergies      Medication List        Accurate as of June 22, 2022  5:05 PM. If you have any questions, ask your nurse or doctor.          amphetamine-dextroamphetamine 30 MG 24 hr capsule Commonly known as: ADDERALL XR Take 1 capsule (30 mg total) by mouth every morning.   buPROPion 150 MG 24 hr tablet Commonly known as: Wellbutrin XL Take 1 tablet (150 mg total) by mouth daily.   busPIRone 7.5 MG tablet Commonly known as: BUSPAR Take 7.5 mg by mouth 2 (two) times daily.   hydrocortisone 2.5 % rectal cream Commonly known as: Anusol-HC Apply to the affected area 1-2 times daily   nitrofurantoin (macrocrystal-monohydrate) 100 MG capsule Commonly known as: Macrobid Take 1 capsule (100 mg total) by mouth 2 (two) times  daily.   propranolol 10 MG tablet Commonly known as: INDERAL Take 10 mg by mouth 2 (two) times daily.   QUEtiapine 100 MG tablet Commonly known as: SEROQUEL Take 100 mg by mouth at bedtime.        Allergies: No Known Allergies  Family History: Family History  Problem Relation Age of Onset   Heart disease Mother    Hypertension Mother    Anxiety disorder Mother    Depression Mother    Hypertension Father    Anxiety disorder Father    Depression Father    Testicular cancer Brother     Social History:  reports that she has never smoked. She has never used smokeless tobacco. She reports that she does not drink alcohol and does not use drugs.   Physical Exam: LMP 05/10/2022 (Exact Date)   Constitutional:  Alert and oriented, No acute distress. HEENT: [place] AT, moist mucus membranes.  Trachea midline, no masses. Cardiovascular: No clubbing, cyanosis, or edema. Respiratory: Normal respiratory effort, no increased work of breathing. GI: Abdomen is soft, nontender, nondistended, no abdominal masses GU: No CVA tenderness Skin: No rashes, bruises or suspicious lesions. Neurologic: Grossly intact, no focal deficits, moving all 4 extremities. Psychiatric: Normal mood and affect.  Laboratory Data: Lab Results  Component Value Date   WBC 6.9 03/30/2022   HGB 13.5 03/30/2022  HCT 40.3 03/30/2022   MCV 89.5 03/30/2022   PLT 277.0 03/30/2022    Lab Results  Component Value Date   CREATININE 0.82 03/30/2022    No results found for: "PSA"  No results found for: "TESTOSTERONE"  Lab Results  Component Value Date   HGBA1C 5.4 03/30/2022    Urinalysis    Component Value Date/Time   COLORURINE YELLOW 07/23/2021 0645   APPEARANCEUR CLOUDY (A) 07/23/2021 0645   LABSPEC 1.027 07/23/2021 0645   PHURINE 5.0 07/23/2021 0645   GLUCOSEU NEGATIVE 07/23/2021 0645   HGBUR NEGATIVE 07/23/2021 0645   BILIRUBINUR Negative 06/03/2022 1325   KETONESUR negative 04/19/2022 1758    KETONESUR NEGATIVE 07/23/2021 0645   PROTEINUR Negative 06/03/2022 1325   PROTEINUR 30 (A) 07/23/2021 0645   UROBILINOGEN 0.2 06/03/2022 1325   NITRITE Negative 06/03/2022 1325   NITRITE NEGATIVE 07/23/2021 0645   LEUKOCYTESUR Small (1+) (A) 06/03/2022 1325   LEUKOCYTESUR TRACE (A) 07/23/2021 0645    Lab Results  Component Value Date   BACTERIA FEW (A) 07/23/2021    Pertinent Imaging: *** No results found for this or any previous visit.  No results found for this or any previous visit.  No results found for this or any previous visit.  No results found for this or any previous visit.  Results for orders placed during the hospital encounter of 06/16/21  US RENAL  Narrative CLINICAL DATA:  Flank pain  EXAM: RENAL / URINARY TRACT ULTRASOUND COMPLETE  COMPARISON:  None.  FINDINGS: Right Kidney:  Renal measurements: 12.4 x 3.2 x 5.4 cm = volume: 180 mL. Mild pelviectasis of the right kidney. Echogenicity within normal limits. No mass visualized.  Left Kidney:  Renal measurements: 12.2 x 5.6 x 5.0 cm = volume: 177 mL. Echogenicity within normal limits. No mass or hydronephrosis visualized.  Bladder:  Appears normal for degree of bladder distention.  Other:  None.  IMPRESSION: Mild pelviectasis of the right kidney.  No visualized renal stones.   Electronically Signed By: Yetta Glassman M.D. On: 06/16/2021 11:48  No valid procedures specified. No results found for this or any previous visit.  No results found for this or any previous visit.   Assessment & Plan:    There are no diagnoses linked to this encounter.  No follow-ups on file.  Hollice Espy, MD  [facility] [address] [place], [place] [address] [phone]

## 2022-06-23 ENCOUNTER — Other Ambulatory Visit: Payer: Self-pay

## 2022-06-23 DIAGNOSIS — N3281 Overactive bladder: Secondary | ICD-10-CM

## 2022-06-24 ENCOUNTER — Encounter: Payer: Self-pay | Admitting: Urology

## 2022-06-27 ENCOUNTER — Ambulatory Visit (INDEPENDENT_AMBULATORY_CARE_PROVIDER_SITE_OTHER): Admitting: Surgical

## 2022-06-27 VITALS — Ht 66.0 in | Wt 181.8 lb

## 2022-06-27 DIAGNOSIS — M546 Pain in thoracic spine: Secondary | ICD-10-CM

## 2022-06-27 DIAGNOSIS — M25511 Pain in right shoulder: Secondary | ICD-10-CM

## 2022-06-27 DIAGNOSIS — M542 Cervicalgia: Secondary | ICD-10-CM

## 2022-06-27 DIAGNOSIS — N62 Hypertrophy of breast: Secondary | ICD-10-CM | POA: Diagnosis not present

## 2022-06-27 DIAGNOSIS — M25512 Pain in left shoulder: Secondary | ICD-10-CM

## 2022-06-27 DIAGNOSIS — M545 Low back pain, unspecified: Secondary | ICD-10-CM | POA: Diagnosis not present

## 2022-06-27 NOTE — Progress Notes (Signed)
Referring Provider Morgan Chang, Morgan Chang,  Morgan Chang [id number]   CC:  Chief Complaint  Patient presents with   Follow-up    Breast reduction/panniculectomy    Morgan Chang is an 26 y.o. female.  HPI:  The patient is a 26 year old female here for evaluation of her abdomen and bilateral breasts.  She is 5 feet 6 inches tall, weighs 181.8 pounds.  She initially saw Dr. Marla Roe on 12/13/2021 for consultation related to abdomen and breast.  At her last appointment in March 2023 she was 214 pounds, she has lost approximately 34 pounds over the past 7 months.  The patient has a history of mammary hyperplasia for several years.  She has extremely large breasts causing symptoms that include the following: Back pain in the upper and lower back, including neck pain and shoulder pain. She has shoulder grooving, reports significant upper back pain when she removes her bra.  She reports that she has back pain and her back gets "locked" causing her pain with movement. She also reports noticing numbing to her right hand when she removes her bra. Reports she no longer exercises/lifts weights due to pain in her shoulders that she attributes to her large breasts.   She has tried multiple modalities for pain relief with no results, including: icy hot patches, ice/heat, stretching, yoga, chiropractor.  She has a history of a breast reduction in [place] in 2016, she thinks she is a triple D cup and would like to be about a C cup.  She reports that even with weight loss she has not had any changes to the size of her breast.  No family history of breast cancer. She reports she requested her operative note from her surgeon in [place], [place]. Dr. Pamala Duffel, MD at [facility].  She reports her abdomen has "shrunk" more since her last visit, she still has a mild abdominal fold.  No Known Allergies  Outpatient Encounter Medications as of 06/27/2022  Medication  Sig   amphetamine-dextroamphetamine (ADDERALL XR) 30 MG 24 hr capsule Take 1 capsule (30 mg total) by mouth every morning.   buPROPion (WELLBUTRIN XL) 150 MG 24 hr tablet Take 1 tablet (150 mg total) by mouth daily.   busPIRone (BUSPAR) 7.5 MG tablet Take 7.5 mg by mouth 2 (two) times daily.   hydrocortisone (ANUSOL-HC) 2.5 % rectal cream Apply to the affected area 1-2 times daily   nitrofurantoin, macrocrystal-monohydrate, (MACROBID) 100 MG capsule Take 1 capsule (100 mg total) by mouth 2 (two) times daily.   propranolol (INDERAL) 10 MG tablet Take 10 mg by mouth 2 (two) times daily.   QUEtiapine (SEROQUEL) 100 MG tablet Take 100 mg by mouth at bedtime.   No facility-administered encounter medications on file as of 06/27/2022.     Past Medical History:  Diagnosis Date   Anemia 09/14/2021   Anxiety    Depression    Endometriosis    Fibroid    Pilar Plate breech presentation 10/29/2016   Gestational diabetes    Migraines    Pregnancy induced hypertension    S/P cesarean section 10/29/2016    Past Surgical History:  Procedure Laterality Date   ADENOIDECTOMY  10/29/2007   BREAST REDUCTION SURGERY     CESAREAN SECTION N/A 10/29/2016   Procedure: CESAREAN SECTION;  Surgeon: Janyth Contes, MD;  Location: [facility];  Service: Obstetrics;  Laterality: N/A;   CESAREAN SECTION N/A 07/25/2021   Procedure: Repeat CESAREAN SECTION;  Surgeon: Aloha Gell,  MD;  Location: [facility];  Service: Obstetrics;  Laterality: N/A;   Repeat C/S BTL   LAPAROSCOPY  12/08/2246   NISSEN FUNDOPLICATION     RHINOPLASTY  03/22/2010   deviated septum   TONSILLECTOMY  2017   TUBAL LIGATION  07/25/2021    Family History  Problem Relation Age of Onset   Heart disease Mother    Hypertension Mother    Anxiety disorder Mother    Depression Mother    Hypertension Father    Anxiety disorder Father    Depression Father    Testicular cancer Brother     Social History   Social History  Narrative   Pre-school teacher     Review of Systems General: Denies fevers, chills MSK: Positive back, neck and shoulder pain Skin: Positive for rash  Physical Exam    06/27/2022    9:48 AM 06/03/2022    1:23 PM 04/19/2022    5:57 PM  Vitals with BMI  Height '5\' 6"'$  '5\' 6"'$    Weight 181 lbs 13 oz 185 lbs 8 oz   BMI 25.00 37.04   Systolic  888 916  Diastolic  80 88  Pulse  76 62    General:  No acute distress,  Alert and oriented, Non-Toxic, Normal speech and affect Breast: Significant breast ptosis and macromastia. Her breasts are extremely large and fairly symmetric. Previous surgical scars noted.  Assessment/Plan The patient has bilateral symptomatic macromastia, she has had a previous reduction in 2016. Unknown technique. We do not have access to the operative report, patient previously requested. This will be important for determining surgical technique necessary for repeat reduction. Will have patient reach out to plastic surgeon in [place] to request records. I did not have access to ROI forms today, discussed with patient she can visit [address] location to complete ROI forms. FD notified. Dr. Pamala Duffel, [facility], (F) is [phone]  She is a good candidate for a breast reduction.  She is interested in pursuing surgical treatment.  She has tried supportive garments and fitted bras with no relief. We did not measure STN today, will need to see Dr. Marla Roe prior to surgery to discuss again.   Pictures were obtained of the patient and placed in the chart with the patient's or guardian's permission.  BSA is 1.96. Will need to discuss amount of tissue to be removed with Dr. Leretha Pol Vaden Becherer 06/27/2022, 10:22 AM

## 2022-07-08 ENCOUNTER — Other Ambulatory Visit: Payer: Self-pay | Admitting: Physician Assistant

## 2022-07-11 MED ORDER — AMPHETAMINE-DEXTROAMPHET ER 30 MG PO CP24
30.0000 mg | ORAL_CAPSULE | ORAL | 0 refills | Status: DC
Start: 2022-07-11 — End: 2022-08-23

## 2022-07-11 NOTE — Progress Notes (Deleted)
07/12/2022 2:29 PM   Morgan Chang [birth year] [id number]  Referring provider: Inda Coke, [place],  [place] [address]  No chief complaint on file.   HPI:    PMH: Past Medical History:  Diagnosis Date   Anemia 09/14/2021   Anxiety    Depression    Endometriosis    Fibroid    Frank breech presentation 10/29/2016   Gestational diabetes    Migraines    Pregnancy induced hypertension    S/P cesarean section 10/29/2016    Surgical History: Past Surgical History:  Procedure Laterality Date   ADENOIDECTOMY  10/29/2007   BREAST REDUCTION SURGERY     CESAREAN SECTION N/A 10/29/2016   Procedure: CESAREAN SECTION;  Surgeon: Janyth Contes, MD;  Location: [facility];  Service: Obstetrics;  Laterality: N/A;   CESAREAN SECTION N/A 07/25/2021   Procedure: Repeat CESAREAN SECTION;  Surgeon: Aloha Gell, MD;  Location: [facility];  Service: Obstetrics;  Laterality: N/A;   Repeat C/S BTL   LAPAROSCOPY  24/40/1027   NISSEN FUNDOPLICATION     RHINOPLASTY  03/22/2010   deviated septum   TONSILLECTOMY  2017   TUBAL LIGATION  07/25/2021    Home Medications:  Allergies as of 07/12/2022   No Known Allergies      Medication List        Accurate as of July 11, 2022  2:29 PM. If you have any questions, ask your nurse or doctor.          amphetamine-dextroamphetamine 30 MG 24 hr capsule Commonly known as: ADDERALL XR Take 1 capsule (30 mg total) by mouth every morning.   buPROPion 150 MG 24 hr tablet Commonly known as: Wellbutrin XL Take 1 tablet (150 mg total) by mouth daily.   busPIRone 7.5 MG tablet Commonly known as: BUSPAR Take 7.5 mg by mouth 2 (two) times daily.   hydrocortisone 2.5 % rectal cream Commonly known as: Anusol-HC Apply to the affected area 1-2 times daily   nitrofurantoin (macrocrystal-monohydrate) 100 MG capsule Commonly known as: Macrobid Take 1 capsule (100 mg total) by mouth 2 (two) times  daily.   propranolol 10 MG tablet Commonly known as: INDERAL Take 10 mg by mouth 2 (two) times daily.   QUEtiapine 100 MG tablet Commonly known as: SEROQUEL Take 100 mg by mouth at bedtime.        Allergies: No Known Allergies  Family History: Family History  Problem Relation Age of Onset   Heart disease Mother    Hypertension Mother    Anxiety disorder Mother    Depression Mother    Hypertension Father    Anxiety disorder Father    Depression Father    Testicular cancer Brother     Social History:  reports that she has never smoked. She has never used smokeless tobacco. She reports that she does not drink alcohol and does not use drugs.   Physical Exam: There were no vitals taken for this visit.  Constitutional:  Alert and oriented, No acute distress. HEENT: [place] AT, moist mucus membranes.  Trachea midline, no masses. Cardiovascular: No clubbing, cyanosis, or edema. Respiratory: Normal respiratory effort, no increased work of breathing. GI: Abdomen is soft, nontender, nondistended, no abdominal masses GU: No CVA tenderness Skin: No rashes, bruises or suspicious lesions. Neurologic: Grossly intact, no focal deficits, moving all 4 extremities. Psychiatric: Normal mood and affect.  Laboratory Data: Lab Results  Component Value Date   WBC 6.9 03/30/2022   HGB 13.5  03/30/2022   HCT 40.3 03/30/2022   MCV 89.5 03/30/2022   PLT 277.0 03/30/2022    Lab Results  Component Value Date   CREATININE 0.82 03/30/2022    No results found for: "PSA"  No results found for: "TESTOSTERONE"  Lab Results  Component Value Date   HGBA1C 5.4 03/30/2022    Urinalysis    Component Value Date/Time   COLORURINE YELLOW 07/23/2021 0645   APPEARANCEUR CLOUDY (A) 07/23/2021 0645   LABSPEC 1.027 07/23/2021 0645   PHURINE 5.0 07/23/2021 0645   GLUCOSEU NEGATIVE 07/23/2021 0645   HGBUR NEGATIVE 07/23/2021 0645   BILIRUBINUR Negative 06/03/2022 1325   KETONESUR negative  04/19/2022 1758   KETONESUR NEGATIVE 07/23/2021 0645   PROTEINUR Negative 06/03/2022 1325   PROTEINUR 30 (A) 07/23/2021 0645   UROBILINOGEN 0.2 06/03/2022 1325   NITRITE Negative 06/03/2022 1325   NITRITE NEGATIVE 07/23/2021 0645   LEUKOCYTESUR Small (1+) (A) 06/03/2022 1325   LEUKOCYTESUR TRACE (A) 07/23/2021 0645    Lab Results  Component Value Date   BACTERIA FEW (A) 07/23/2021    Pertinent Imaging: *** No results found for this or any previous visit.  No results found for this or any previous visit.  No results found for this or any previous visit.  No results found for this or any previous visit.  Results for orders placed during the hospital encounter of 06/16/21  US RENAL  Narrative CLINICAL DATA:  Flank pain  EXAM: RENAL / URINARY TRACT ULTRASOUND COMPLETE  COMPARISON:  None.  FINDINGS: Right Kidney:  Renal measurements: 12.4 x 3.2 x 5.4 cm = volume: 180 mL. Mild pelviectasis of the right kidney. Echogenicity within normal limits. No mass visualized.  Left Kidney:  Renal measurements: 12.2 x 5.6 x 5.0 cm = volume: 177 mL. Echogenicity within normal limits. No mass or hydronephrosis visualized.  Bladder:  Appears normal for degree of bladder distention.  Other:  None.  IMPRESSION: Mild pelviectasis of the right kidney.  No visualized renal stones.   Electronically Signed By: Yetta Glassman M.D. On: 06/16/2021 11:48  No valid procedures specified. No results found for this or any previous visit.  No results found for this or any previous visit.   Assessment & Plan:    There are no diagnoses linked to this encounter.  No follow-ups on file.  Hollice Espy, MD  [facility] [address], [place],  [address] [phone]

## 2022-07-12 ENCOUNTER — Encounter: Payer: Self-pay | Admitting: Urology

## 2022-07-13 ENCOUNTER — Encounter: Payer: Self-pay | Admitting: Urology

## 2022-07-18 ENCOUNTER — Telehealth: Payer: Self-pay

## 2022-07-18 NOTE — Telephone Encounter (Signed)
Pt called to inform us that per her interaction with her insurance company, [employer] a prior authorization for breast reduction is not needed as long as required guidelines are met. She had a consultation with Matt on 10/9 as Dr. Marla Roe was not available.   [employer] interaction # [id number]

## 2022-07-19 NOTE — Telephone Encounter (Signed)
Pt returning Tracy's phone call.

## 2022-07-19 NOTE — Telephone Encounter (Signed)
Called patient, LMVM inquired if she signed a release of medical records when she was in our office in order to receive the OP note from the surgeon in [place].

## 2022-07-20 NOTE — Telephone Encounter (Signed)
Returned patients call, she came by the office yesterday and signed release of medical records and were faxed to her previous surgeon

## 2022-07-21 ENCOUNTER — Encounter: Payer: Self-pay | Admitting: Emergency Medicine

## 2022-07-21 ENCOUNTER — Ambulatory Visit (INDEPENDENT_AMBULATORY_CARE_PROVIDER_SITE_OTHER)

## 2022-07-21 ENCOUNTER — Ambulatory Visit
Admission: EM | Admit: 2022-07-21 | Discharge: 2022-07-21 | Disposition: A | Discharge status: Home / Self Care | Attending: Urgent Care | Admitting: Urgent Care

## 2022-07-21 DIAGNOSIS — M542 Cervicalgia: Secondary | ICD-10-CM

## 2022-07-21 DIAGNOSIS — M436 Torticollis: Secondary | ICD-10-CM

## 2022-07-21 MED ORDER — NAPROXEN 375 MG PO TABS: 375.0000 mg | ORAL_TABLET | Freq: Two times a day (BID) | ORAL | 0 refills | Status: DC

## 2022-07-21 MED ORDER — TIZANIDINE HCL 4 MG PO TABS: 4.0000 mg | ORAL_TABLET | Freq: Every day | ORAL | 0 refills | Status: DC

## 2022-07-21 MED ORDER — PREDNISONE 50 MG PO TABS: 50.0000 mg | ORAL_TABLET | Freq: Every day | ORAL | 0 refills | Status: DC

## 2022-07-21 NOTE — ED Triage Notes (Signed)
Pt was walking and moving arms when got intense pain in left side of neck. Reports hard to turn head or move head up and down due to pain. Took Naproxen for pain and Biofreeze.

## 2022-07-21 NOTE — ED Provider Notes (Signed)
[name] - URGENT [facility]ARE [facility]  Note:  This document was prepared using Systems analyst and may include unintentional dictation errors.  MRN: [id number] DOB: 04-Nov-1995  Subjective:   Morgan Chang is a 26 y.o. female presenting for acute onset recurrent left-sided neck pain and stiffness.  Patient has had significant difficulty moving her neck anywhere away from the left side.  Symptoms started randomly.  She felt a pop and then found it very difficult to move.  Has had intermittent left arm numbness and tingling.  No fall, trauma.  Has had recurrent issues for the past several months.  They have actually discussed breast reduction surgery as I believe its potentially related.  This is currently in the works.  Has not had any particular neck imaging, has not seen a spine specialist.  No current facility-administered medications for this encounter.  Current Outpatient Medications:    amphetamine-dextroamphetamine (ADDERALL XR) 30 MG 24 hr capsule, Take 1 capsule (30 mg total) by mouth every morning., Disp: 30 capsule, Rfl: 0   buPROPion (WELLBUTRIN XL) 150 MG 24 hr tablet, Take 1 tablet (150 mg total) by mouth daily., Disp: 90 tablet, Rfl: 1   busPIRone (BUSPAR) 7.5 MG tablet, Take 7.5 mg by mouth 2 (two) times daily., Disp: , Rfl:    hydrocortisone (ANUSOL-HC) 2.5 % rectal cream, Apply to the affected area 1-2 times daily, Disp: 30 g, Rfl: 0   nitrofurantoin, macrocrystal-monohydrate, (MACROBID) 100 MG capsule, Take 1 capsule (100 mg total) by mouth 2 (two) times daily., Disp: 10 capsule, Rfl: 0   propranolol (INDERAL) 10 MG tablet, Take 10 mg by mouth 2 (two) times daily., Disp: , Rfl:    QUEtiapine (SEROQUEL) 100 MG tablet, Take 100 mg by mouth at bedtime., Disp: , Rfl:    No Known Allergies  Past Medical History:  Diagnosis Date   Anemia 09/14/2021   Anxiety    Depression    Endometriosis    Fibroid    Frank breech presentation 10/29/2016   Gestational  diabetes    Migraines    Pregnancy induced hypertension    S/P cesarean section 10/29/2016     Past Surgical History:  Procedure Laterality Date   ADENOIDECTOMY  10/29/2007   BREAST REDUCTION SURGERY     CESAREAN SECTION N/A 10/29/2016   Procedure: CESAREAN SECTION;  Surgeon: Janyth Contes, MD;  Location: [place];  Service: Obstetrics;  Laterality: N/A;   CESAREAN SECTION N/A 07/25/2021   Procedure: Repeat CESAREAN SECTION;  Surgeon: Aloha Gell, MD;  Location: [place];  Service: Obstetrics;  Laterality: N/A;   Repeat C/S BTL   LAPAROSCOPY  43/32/9518   NISSEN FUNDOPLICATION     RHINOPLASTY  03/22/2010   deviated septum   TONSILLECTOMY  2017   TUBAL LIGATION  07/25/2021    Family History  Problem Relation Age of Onset   Heart disease Mother    Hypertension Mother    Anxiety disorder Mother    Depression Mother    Hypertension Father    Anxiety disorder Father    Depression Father    Testicular cancer Brother     Social History   Tobacco Use   Smoking status: Never   Smokeless tobacco: Never  Vaping Use   Vaping Use: Never used  Substance Use Topics   Alcohol use: No   Drug use: No    ROS   Objective:   Vitals: BP 124/75 (BP Location: Left Arm)   Pulse 73   Temp  98.1 F (36.7 C) (Oral)   Resp 16   LMP 06/29/2022   SpO2 97%   Physical Exam Constitutional:      General: She is not in acute distress.    Appearance: Normal appearance. She is well-developed. She is not ill-appearing, toxic-appearing or diaphoretic.  HENT:     Head: Normocephalic and atraumatic.     Nose: Nose normal.     Mouth/Throat:     Mouth: Mucous membranes are moist.  Eyes:     General: No scleral icterus.       Right eye: No discharge.        Left eye: No discharge.     Extraocular Movements: Extraocular movements intact.  Cardiovascular:     Rate and Rhythm: Normal rate.  Pulmonary:     Effort: Pulmonary effort is normal.  Musculoskeletal:      Cervical back: Spasms, torticollis and tenderness (positive Spurling maneuver to the left, negative Lhermitte sign) present. No swelling, edema, deformity, erythema, signs of trauma, lacerations, rigidity, bony tenderness or crepitus. Pain with movement present. Decreased range of motion.  Skin:    General: Skin is warm and dry.  Neurological:     General: No focal deficit present.     Mental Status: She is alert and oriented to person, place, and time.  Psychiatric:        Mood and Affect: Mood normal.        Behavior: Behavior normal.     DG Cervical Spine Complete  Result Date: 07/21/2022 CLINICAL DATA:  Neck pain EXAM: CERVICAL SPINE - COMPLETE 4+ VIEW COMPARISON:  None Available. FINDINGS: There is no radiographically evident cervical spine fracture. There is straightening of the cervical lordosis with slight reversal likely related to patient positioning. There is mild disc height loss at C4-C5 and C5-C6. There is mild facet arthropathy at C7-T1. Possible bony fusion at the C2-C3 facets. No definite neural foraminal narrowing, with caveat of poorly visualized neural foramina at C7-T1 bilaterally due to technique. IMPRESSION: No radiographically evident cervical spine fracture. Mild disc height loss at C4-C5 and C5-C6. Mild facet arthropathy at C7-T1. Electronically Signed   By: Maurine Simmering M.D.   On: 07/21/2022 11:06     Assessment and Plan :   PDMP not reviewed this encounter.  1. Torticollis   2. Neck pain     Reviewed radiology over read including the mild disc height loss at C4-C5, C5-C6.  This would be suggestive of degenerative disc disease but recommended further evaluation and imaging to [facility].  For now given severity of patient's pain and elements of numbness and tingling on occasion with her left arm offered an oral prednisone course.  Use tizanidine as a muscle relaxant.  Follow-up closely with the spine clinic. Counseled patient on  potential for adverse effects with medications prescribed/recommended today, ER and return-to-clinic precautions discussed, patient verbalized understanding.    Jaynee Eagles, [name] 07/21/22 1223

## 2022-07-28 NOTE — Telephone Encounter (Signed)
[facility]. They have not received signed medical release form.  Discovered at the time of surgery 2016 her last name was "Bristow". Called patient, LMVM the status of receiving OP notes for BL breast reduction.

## 2022-08-15 ENCOUNTER — Telehealth: Payer: Self-pay | Admitting: Orthopaedic Surgery

## 2022-08-15 ENCOUNTER — Ambulatory Visit (HOSPITAL_BASED_OUTPATIENT_CLINIC_OR_DEPARTMENT_OTHER): Admitting: Orthopaedic Surgery

## 2022-08-15 NOTE — Telephone Encounter (Signed)
LMOM stating rescheduled appointment for 11/30 at 9:00am

## 2022-08-15 NOTE — Telephone Encounter (Signed)
Pt called in stating she needs to cancel her appointment for today because her daughter is very sick... Pt is requesting to reschedule appt... Pt requesting callback.Marland KitchenMarland Kitchen

## 2022-08-16 ENCOUNTER — Other Ambulatory Visit: Payer: Self-pay | Admitting: Physician Assistant

## 2022-08-17 ENCOUNTER — Telehealth: Payer: Self-pay

## 2022-08-17 NOTE — Telephone Encounter (Signed)
[facility]. Patients records were found under the last name "Bistrow". They will fax all her notes by the end of the week.

## 2022-08-18 ENCOUNTER — Telehealth: Payer: Self-pay | Admitting: Surgical

## 2022-08-18 ENCOUNTER — Ambulatory Visit (HOSPITAL_BASED_OUTPATIENT_CLINIC_OR_DEPARTMENT_OTHER): Admitting: Orthopaedic Surgery

## 2022-08-18 NOTE — Telephone Encounter (Signed)
LVM for pt to contact office to schedule an appt with Dr. Marla Roe or his PA when she is here.

## 2022-08-18 NOTE — Telephone Encounter (Signed)
I think we should set patient up for appointment with Dr. Marla Roe. She came to see me one day at [facility] office to discuss her abdomen and we subsequently started talking about her breast reduction. Because she had a reduction in the past and has not seen Dr. Marla Roe for this, I would like her to see Dr. Marla Roe for a formal consult  [facility]

## 2022-08-19 ENCOUNTER — Telehealth: Payer: Self-pay

## 2022-08-19 NOTE — Telephone Encounter (Signed)
Received OP notes from Dr. Kathe Becton who performed original BL breast reduction.  OP notes did not indicate amount of Grams removed from each breast. Called office back, LMVM requesting notes with the amount of grams removed.

## 2022-08-23 ENCOUNTER — Other Ambulatory Visit: Payer: Self-pay | Admitting: Physician Assistant

## 2022-08-23 ENCOUNTER — Ambulatory Visit: Admitting: Physician Assistant

## 2022-08-23 MED ORDER — AMPHETAMINE-DEXTROAMPHET ER 30 MG PO CP24
30.0000 mg | ORAL_CAPSULE | ORAL | 0 refills | Status: DC
Start: 2022-08-23 — End: 2022-08-25

## 2022-08-23 NOTE — Telephone Encounter (Signed)
Sent message by MyChart for patient to call the office and request notes that indicated how much tissue was removed each breast which was not noted in the Op noted received.  This will help expedite the information that we need.

## 2022-08-23 NOTE — Progress Notes (Shared)
Morgan Chang is a 26 y.o. female here for a follow up of a pre-existing problem.  History of Present Illness:   No chief complaint on file.   HPI  Past Medical History:  Diagnosis Date   Anemia 09/14/2021   Anxiety    Depression    Endometriosis    Fibroid    Frank breech presentation 10/29/2016   Gestational diabetes    Migraines    Pregnancy induced hypertension    S/P cesarean section 10/29/2016     Social History   Tobacco Use   Smoking status: Never   Smokeless tobacco: Never  Vaping Use   Vaping Use: Never used  Substance Use Topics   Alcohol use: No   Drug use: No    Past Surgical History:  Procedure Laterality Date   ADENOIDECTOMY  10/29/2007   BREAST REDUCTION SURGERY     CESAREAN SECTION N/A 10/29/2016   Procedure: CESAREAN SECTION;  Surgeon: Janyth Contes, MD;  Location: [place];  Service: Obstetrics;  Laterality: N/A;   CESAREAN SECTION N/A 07/25/2021   Procedure: Repeat CESAREAN SECTION;  Surgeon: Aloha Gell, MD;  Location: [place] [facility];  Service: Obstetrics;  Laterality: N/A;   Repeat C/S BTL   LAPAROSCOPY  93/81/0175   NISSEN FUNDOPLICATION     RHINOPLASTY  03/22/2010   deviated septum   TONSILLECTOMY  2017   TUBAL LIGATION  07/25/2021    Family History  Problem Relation Age of Onset   Heart disease Mother    Hypertension Mother    Anxiety disorder Mother    Depression Mother    Hypertension Father    Anxiety disorder Father    Depression Father    Testicular cancer Brother     No Known Allergies  Current Medications:   Current Outpatient Medications:    amphetamine-dextroamphetamine (ADDERALL XR) 30 MG 24 hr capsule, Take 1 capsule (30 mg total) by mouth every morning., Disp: 30 capsule, Rfl: 0   buPROPion (WELLBUTRIN XL) 150 MG 24 hr tablet, TAKE 1 TABLET BY MOUTH EVERY DAY, Disp: 30 tablet, Rfl: 2   busPIRone (BUSPAR) 7.5 MG tablet, Take 7.5 mg by mouth 2 (two) times daily., Disp: , Rfl:    hydrocortisone  (ANUSOL-HC) 2.5 % rectal cream, Apply to the affected area 1-2 times daily, Disp: 30 g, Rfl: 0   naproxen (NAPROSYN) 375 MG tablet, Take 1 tablet (375 mg total) by mouth 2 (two) times daily with a meal., Disp: 30 tablet, Rfl: 0   nitrofurantoin, macrocrystal-monohydrate, (MACROBID) 100 MG capsule, Take 1 capsule (100 mg total) by mouth 2 (two) times daily., Disp: 10 capsule, Rfl: 0   predniSONE (DELTASONE) 50 MG tablet, Take 1 tablet (50 mg total) by mouth daily with breakfast., Disp: 5 tablet, Rfl: 0   propranolol (INDERAL) 10 MG tablet, Take 10 mg by mouth 2 (two) times daily., Disp: , Rfl:    QUEtiapine (SEROQUEL) 100 MG tablet, Take 100 mg by mouth at bedtime., Disp: , Rfl:    tiZANidine (ZANAFLEX) 4 MG tablet, Take 1 tablet (4 mg total) by mouth at bedtime., Disp: 30 tablet, Rfl: 0   Review of Systems:   ROS  Vitals:   There were no vitals filed for this visit.   There is no height or weight on file to calculate BMI.  Physical Exam:   Physical Exam Constitutional:      General: She is not in acute distress.    Appearance: Normal appearance. She is not ill-appearing.  HENT:  Head: Normocephalic and atraumatic.     Right Ear: External ear normal.     Left Ear: External ear normal.  Eyes:     Extraocular Movements: Extraocular movements intact.     Pupils: Pupils are equal, round, and reactive to light.  Cardiovascular:     Rate and Rhythm: Normal rate and regular rhythm.     Heart sounds: Normal heart sounds. No murmur heard.    No gallop.  Pulmonary:     Effort: Pulmonary effort is normal. No respiratory distress.     Breath sounds: Normal breath sounds. No wheezing or rales.  Skin:    General: Skin is warm and dry.  Neurological:     Mental Status: She is alert and oriented to person, place, and time.  Psychiatric:        Judgment: Judgment normal.     Assessment and Plan:   There are no diagnoses linked to this encounter.   I,Verona Buck,acting as a Education administrator  for [employer], PA.,have documented all relevant documentation on the behalf of Inda Coke, PA,as directed by  Inda Coke, PA while in the presence of Inda Coke, [place].   Inda Coke, [name]

## 2022-08-25 ENCOUNTER — Encounter: Payer: Self-pay | Admitting: Physician Assistant

## 2022-08-25 ENCOUNTER — Ambulatory Visit (INDEPENDENT_AMBULATORY_CARE_PROVIDER_SITE_OTHER): Admitting: Physician Assistant

## 2022-08-25 VITALS — BP 110/84 | HR 89 | Temp 97.1°F | Resp 16 | Ht 66.0 in | Wt 190.4 lb

## 2022-08-25 DIAGNOSIS — G479 Sleep disorder, unspecified: Secondary | ICD-10-CM | POA: Diagnosis not present

## 2022-08-25 DIAGNOSIS — F331 Major depressive disorder, recurrent, moderate: Secondary | ICD-10-CM

## 2022-08-25 DIAGNOSIS — F902 Attention-deficit hyperactivity disorder, combined type: Secondary | ICD-10-CM | POA: Diagnosis not present

## 2022-08-25 DIAGNOSIS — F411 Generalized anxiety disorder: Secondary | ICD-10-CM | POA: Diagnosis not present

## 2022-08-25 MED ORDER — QUETIAPINE FUMARATE 50 MG PO TABS: 50.0000 mg | ORAL_TABLET | Freq: Every day | ORAL | 0 refills | Status: DC

## 2022-08-25 MED ORDER — LISDEXAMFETAMINE DIMESYLATE 70 MG PO CAPS: 70.0000 mg | ORAL_CAPSULE | Freq: Every day | ORAL | 0 refills | Status: DC

## 2022-08-25 MED ORDER — QUETIAPINE FUMARATE 100 MG PO TABS: 100.0000 mg | ORAL_TABLET | Freq: Every day | ORAL | 0 refills | Status: DC

## 2022-08-25 NOTE — Progress Notes (Signed)
Morgan Chang is a 26 y.o. female here for a follow up of a pre-existing problem.  History of Present Illness:   Chief Complaint  Morgan Chang presents with   Depression    Has tried and failed 9 medication since August Currently Morgan Chang at [facility]  Interested in [employer] testing by psychiatrist states it is not appropriate   Recently diagnosed with BPD - but doesn't believe this is accurate    Anxiety    HPI  Anxiety  Morgan Chang started seeing a psychiatrist at [place] in August, but Morgan Chang feels like Morgan Chang is not being listened to. Currently seeing a therapist and has been for x1 month, and Morgan Chang feels this is helping Morgan Chang.  Morgan Chang has tried many medications -- Zoloft and Lexapro - States it was not great Celexa- Okay for Morgan Chang anxiety  Wellbutrin- Okay  Prozac- [facility]  Ambien, Lunesta, Mirtazapine - Causes Morgan Chang to sleep a lot Buspar- Causes headaches  Trintellix and Vyvanse- works well  Ritalin- No relief  Adderall- Okay  Depakote- Hair loss, apathetic  Xanax- Okay Hydroxy- No relief   Morgan Chang feels as if Morgan Chang anxiety and depression is not improving at all Complains of having hair loss, weight loss, severe depression and anxiety, adhd is uncontrollable, brain fogs, and worsening suicidial thoughts.    Morgan Chang is currently on Seroquel 200 mg, Adderall 30 mg, Wellbutrin 150 mg, and Depakote 500 mg.  Morgan Chang would like to be taken off of the Depakote and start Vyvanse.     Past Medical History:  Diagnosis Date   Anemia 09/14/2021   Anxiety    Depression    Endometriosis    Fibroid    Frank breech presentation 10/29/2016   Gestational diabetes    Migraines    Pregnancy induced hypertension    S/P cesarean section 10/29/2016     Social History   Tobacco Use   Smoking status: Never   Smokeless tobacco: Never  Vaping Use   Vaping Use: Never used  Substance Use Topics   Alcohol use: No   Drug use: No    Past Surgical History:  Procedure Laterality  Date   ADENOIDECTOMY  10/29/2007   BREAST REDUCTION SURGERY     CESAREAN SECTION N/A 10/29/2016   Procedure: CESAREAN SECTION;  Surgeon: Janyth Contes, MD;  Location: [place];  Service: Obstetrics;  Laterality: N/A;   CESAREAN SECTION N/A 07/25/2021   Procedure: Repeat CESAREAN SECTION;  Surgeon: Aloha Gell, MD;  Location: [place] [facility];  Service: Obstetrics;  Laterality: N/A;   Repeat C/S BTL   LAPAROSCOPY  54/62/7035   NISSEN FUNDOPLICATION     RHINOPLASTY  03/22/2010   deviated septum   TONSILLECTOMY  2017   TUBAL LIGATION  07/25/2021    Family History  Problem Relation Age of Onset   Heart disease Mother    Hypertension Mother    Anxiety disorder Mother    Depression Mother    Hypertension Father    Anxiety disorder Father    Depression Father    Testicular cancer Brother     No Known Allergies  Current Medications:   Current Outpatient Medications:    amphetamine-dextroamphetamine (ADDERALL XR) 30 MG 24 hr capsule, Take 1 capsule (30 mg total) by mouth every morning., Disp: 30 capsule, Rfl: 0   buPROPion (WELLBUTRIN XL) 150 MG 24 hr tablet, TAKE 1 TABLET BY MOUTH EVERY DAY, Disp: 30 tablet, Rfl: 2   divalproex (DEPAKOTE) 500 MG DR tablet, Take 500 mg  by mouth 3 (three) times daily., Disp: , Rfl:    QUEtiapine (SEROQUEL) 100 MG tablet, Take 100 mg by mouth at bedtime., Disp: , Rfl:    busPIRone (BUSPAR) 7.5 MG tablet, Take 7.5 mg by mouth 2 (two) times daily., Disp: , Rfl:    hydrocortisone (ANUSOL-HC) 2.5 % rectal cream, Apply to the affected area 1-2 times daily, Disp: 30 g, Rfl: 0   naproxen (NAPROSYN) 375 MG tablet, Take 1 tablet (375 mg total) by mouth 2 (two) times daily with a meal., Disp: 30 tablet, Rfl: 0   nitrofurantoin, macrocrystal-monohydrate, (MACROBID) 100 MG capsule, Take 1 capsule (100 mg total) by mouth 2 (two) times daily., Disp: 10 capsule, Rfl: 0   predniSONE (DELTASONE) 50 MG tablet, Take 1 tablet (50 mg total) by mouth daily with  breakfast., Disp: 5 tablet, Rfl: 0   propranolol (INDERAL) 10 MG tablet, Take 10 mg by mouth 2 (two) times daily., Disp: , Rfl:    tiZANidine (ZANAFLEX) 4 MG tablet, Take 1 tablet (4 mg total) by mouth at bedtime., Disp: 30 tablet, Rfl: 0   Review of Systems:   Review of Systems  Constitutional:  Positive for weight loss. Negative for chills, fever and malaise/fatigue.       + Hair loss  HENT:  Negative for hearing loss, sinus pain and sore throat.   Respiratory:  Negative for cough and hemoptysis.   Cardiovascular:  Negative for chest pain, palpitations, leg swelling and PND.  Gastrointestinal:  Negative for abdominal pain, constipation, diarrhea, heartburn, nausea and vomiting.  Genitourinary:  Negative for dysuria, frequency and urgency.  Musculoskeletal:  Negative for back pain, myalgias and neck pain.  Skin:  Negative for itching and rash.  Neurological:  Negative for dizziness, tingling, seizures and headaches.  Endo/Heme/Allergies:  Negative for polydipsia.  Psychiatric/Behavioral:  Positive for depression. The Morgan Chang is nervous/anxious.     Vitals:   Vitals:   08/25/22 0843  BP: 110/84  Pulse: 89  Resp: 16  Temp: (!) 97.1 F (36.2 C)  TempSrc: Temporal  SpO2: 99%  Weight: 190 lb 6.4 oz (86.4 kg)  Height: '5\' 6"'$  (1.676 m)     Body mass index is 30.73 kg/m.  Physical Exam:   Physical Exam Constitutional:      Appearance: Normal appearance. Morgan Chang is well-developed.  HENT:     Head: Normocephalic and atraumatic.  Eyes:     General: Lids are normal.     Extraocular Movements: Extraocular movements intact.     Conjunctiva/sclera: Conjunctivae normal.  Pulmonary:     Effort: Pulmonary effort is normal.  Musculoskeletal:        General: Normal range of motion.     Cervical back: Normal range of motion and neck supple.  Skin:    General: Skin is warm and dry.  Neurological:     Mental Status: Morgan Chang is alert and oriented to person, place, and time.  Psychiatric:         Attention and Perception: Attention and perception normal.        Mood and Affect: Mood normal.        Behavior: Behavior normal.        Thought Content: Thought content normal.        Judgment: Judgment normal.     Assessment and Plan:   Generalized anxiety disorder; Moderate episode of recurrent major depressive disorder ([place]) Uncontrolled I recommended that Morgan Chang reach out to [facility] and ask them for an alternative provider If  they are unable to accommodate this request, we will refer Morgan Chang to [facility] I will defer management of Morgan Chang Depakote to this office I discussed with Morgan Chang that if they develop any SI, to tell someone immediately and seek medical attention. Morgan Chang office today Continue talk therapy  Attention deficit hyperactivity disorder (ADHD), combined type Uncontrolled Stop Adderall and start Vyvanse 70 mg daily I asked Morgan Chang to see me a message in 2 weeks to see how this changes going  Sleep difficulties Uncontrolled We will slowly wean Morgan Chang Seroquel down as it is not helping Morgan Chang I have given Morgan Chang 100 mg and 50 mg for Morgan Chang to taper down, instructions in AVS as follows: Decrease your seroquel to 150 mg stay on this for two weeks, then go to 100 mg x 2 weeks, then 50 mg x 2 weeks. Follow-up with me or psychiatry in 6 weeks to check in on this and possible switch to Ambien and or mirtazapine.  I,Moesha Myer,acting as a Education administrator for [employer], PA.,have documented all relevant documentation on the behalf of Inda Coke, PA,as directed by  Inda Coke, PA while in the presence of Inda Coke, [place].  I, Inda Coke, [place], have reviewed all documentation for this visit. The documentation on 08/25/22 for the exam, diagnosis, procedures, and orders are all accurate and complete.  Inda Coke [name]

## 2022-08-25 NOTE — Patient Instructions (Addendum)
Here is the plan for today!  Call [facility] to see if you can switch providers If not, message me and we will refer to [facility]  Stop Adderall and switch to Vyvanse once you get this  Let's have you take Vyvanse consistently for two weeks Message me at 2 weeks into the vyvanse and we will consider the wellbutrin to trintellix transition. (Remind me at this time if we are going to use the mail order program so I do not send it in incorrectly.)  Decrease your seroquel to 150 mg stay on this for two weeks, then go to 100 mg x 2 weeks, then 50 mg x 2 weeks. When you start 50 mg, let's have you schedule a virtual appointment with me to check in and plan for the ambien/remeron.  If you develop suicidal thoughts, please tell someone and immediately proceed to our local 24/7 crisis center, [facility] the [facility]. [address] [place], [place] [address] [phone].  Inda Coke [name]

## 2022-08-30 ENCOUNTER — Telehealth: Payer: Self-pay

## 2022-08-30 NOTE — Telephone Encounter (Signed)
Called patient and advised I have still have not heard or received any information in regards to the amount of CC's that were removed from her previous surgery. I have call several times and have left messages.  Requested for her to try to call as well.

## 2022-09-01 ENCOUNTER — Encounter: Payer: Self-pay | Admitting: *Deleted

## 2022-09-06 ENCOUNTER — Ambulatory Visit (INDEPENDENT_AMBULATORY_CARE_PROVIDER_SITE_OTHER): Admitting: Plastic Surgery

## 2022-09-06 ENCOUNTER — Encounter: Payer: Self-pay | Admitting: Plastic Surgery

## 2022-09-06 VITALS — BP 134/90 | HR 92 | Ht 66.0 in | Wt 183.8 lb

## 2022-09-06 DIAGNOSIS — M545 Low back pain, unspecified: Secondary | ICD-10-CM

## 2022-09-06 DIAGNOSIS — N62 Hypertrophy of breast: Secondary | ICD-10-CM

## 2022-09-06 DIAGNOSIS — M546 Pain in thoracic spine: Secondary | ICD-10-CM | POA: Diagnosis not present

## 2022-09-06 DIAGNOSIS — M542 Cervicalgia: Secondary | ICD-10-CM | POA: Diagnosis not present

## 2022-09-06 DIAGNOSIS — F410 Panic disorder [episodic paroxysmal anxiety] without agoraphobia: Secondary | ICD-10-CM

## 2022-09-06 DIAGNOSIS — Z6829 Body mass index (BMI) 29.0-29.9, adult: Secondary | ICD-10-CM

## 2022-09-06 DIAGNOSIS — F411 Generalized anxiety disorder: Secondary | ICD-10-CM

## 2022-09-06 NOTE — Progress Notes (Signed)
Patient ID: Morgan Chang, female    DOB: 1996-01-29, 26 y.o.   MRN: [id number]   Chief Complaint  Patient presents with   Advice Only   Breast Problem    Mammary Hyperplasia: The patient is a 26 y.o. female with a history of mammary hyperplasia for several years.  She has extremely large breasts causing symptoms that include the following: Back pain in the upper and lower back, including neck pain. She pulls or pins her bra straps to provide better lift and relief of the pressure and pain. She notices relief by holding her breast up manually.  Her shoulder straps cause grooves and pain and pressure that requires padding for relief. Pain medication is sometimes required with motrin and tylenol.  Activities that are hindered by enlarged breasts include: exercise and running.  She has tried supportive clothing as well as fitted bras without improvement.  Her breasts are extremely large and fairly symmetric.  She has hyperpigmentation of the inframammary area on both sides.  The sternal to nipple distance on the right is 26 cm and the left is 26 cm.  The IMF distance is 14 cm.  She is 5 feet 6 inches tall and weighs 183 pounds.  The BMI = 29.5 kg/m.  Preoperative bra size = H cup.  She would like to be a C or D cup the estimated excess breast tissue to be removed at the time of surgery = 400 grams on the left and 400 grams on the right.  Mammogram history: None.  Family history of breast cancer: None.  Tobacco use: None.   The patient expresses the desire to pursue surgical intervention.  The patient underwent a bilateral breast reduction 7 years ago in [place] with an inferior pedicle technique.  I do not know how much was removed as it is not included in the operative note.     Review of Systems  Constitutional: Negative.   HENT: Negative.    Eyes: Negative.   Respiratory: Negative.  Negative for chest tightness and shortness of breath.   Cardiovascular: Negative.  Negative for leg  swelling.  Gastrointestinal: Negative.   Endocrine: Negative.   Genitourinary: Negative.   Musculoskeletal: Negative.     Past Medical History:  Diagnosis Date   Anemia 09/14/2021   Anxiety    Depression    Endometriosis    Fibroid    Pilar Plate breech presentation 10/29/2016   Gestational diabetes    Migraines    Pregnancy induced hypertension    S/P cesarean section 10/29/2016    Past Surgical History:  Procedure Laterality Date   ADENOIDECTOMY  10/29/2007   BREAST REDUCTION SURGERY     CESAREAN SECTION N/A 10/29/2016   Procedure: CESAREAN SECTION;  Surgeon: Janyth Contes, MD;  Location: [facility];  Service: Obstetrics;  Laterality: N/A;   CESAREAN SECTION N/A 07/25/2021   Procedure: Repeat CESAREAN SECTION;  Surgeon: Aloha Gell, MD;  Location: [facility];  Service: Obstetrics;  Laterality: N/A;   Repeat C/S BTL   LAPAROSCOPY  30/16/0109   NISSEN FUNDOPLICATION     RHINOPLASTY  03/22/2010   deviated septum   TONSILLECTOMY  2017   TUBAL LIGATION  07/25/2021      Current Outpatient Medications:    buPROPion (WELLBUTRIN XL) 150 MG 24 hr tablet, TAKE 1 TABLET BY MOUTH EVERY DAY, Disp: 30 tablet, Rfl: 2   lisdexamfetamine (VYVANSE) 70 MG capsule, Take 1 capsule (70 mg total) by mouth daily., Disp: 30 capsule,  Rfl: 0   naproxen (NAPROSYN) 500 MG tablet, as needed for mild pain., Disp: , Rfl:    QUEtiapine (SEROQUEL) 100 MG tablet, Take 1 tablet (100 mg total) by mouth at bedtime., Disp: 30 tablet, Rfl: 0   QUEtiapine (SEROQUEL) 50 MG tablet, Take 1 tablet (50 mg total) by mouth at bedtime., Disp: 30 tablet, Rfl: 0   Objective:   Vitals:   09/06/22 1430  BP: (!) 134/90  Pulse: 92  SpO2: 100%    Physical Exam Vitals reviewed.  Constitutional:      Appearance: Normal appearance.  HENT:     Head: Normocephalic and atraumatic.  Cardiovascular:     Rate and Rhythm: Normal rate.     Pulses: Normal pulses.  Pulmonary:     Effort: Pulmonary effort is  normal. No respiratory distress.  Abdominal:     Palpations: Abdomen is soft.  Skin:    General: Skin is warm.     Coloration: Skin is not jaundiced.     Findings: No bruising.  Neurological:     Mental Status: She is alert and oriented to person, place, and time.  Psychiatric:        Mood and Affect: Mood normal.        Behavior: Behavior normal.        Thought Content: Thought content normal.        Judgment: Judgment normal.     Assessment & Plan:  Panic attack as reaction to stress  Generalized anxiety disorder  Symptomatic mammary hypertrophy  The patient is a candidate for bilateral breast reduction with possible liposuction.  This would be revision with inferior pedicle technique and would need to be done at [facility].  [place], DO

## 2022-09-08 ENCOUNTER — Encounter: Payer: Self-pay | Admitting: Orthopaedic Surgery

## 2022-09-08 ENCOUNTER — Encounter: Payer: Self-pay | Admitting: Physician Assistant

## 2022-09-08 ENCOUNTER — Ambulatory Visit (INDEPENDENT_AMBULATORY_CARE_PROVIDER_SITE_OTHER)

## 2022-09-08 ENCOUNTER — Ambulatory Visit (INDEPENDENT_AMBULATORY_CARE_PROVIDER_SITE_OTHER): Admitting: Orthopaedic Surgery

## 2022-09-08 DIAGNOSIS — M545 Low back pain, unspecified: Secondary | ICD-10-CM | POA: Diagnosis not present

## 2022-09-08 DIAGNOSIS — M542 Cervicalgia: Secondary | ICD-10-CM | POA: Diagnosis not present

## 2022-09-08 MED ORDER — METHOCARBAMOL 750 MG PO TABS: 750.0000 mg | ORAL_TABLET | Freq: Two times a day (BID) | ORAL | 2 refills | Status: DC | PRN

## 2022-09-08 MED ORDER — METHYLPREDNISOLONE 4 MG PO TBPK: ORAL_TABLET | ORAL | 0 refills | Status: DC

## 2022-09-08 NOTE — Progress Notes (Signed)
Office Visit Note   Patient: Morgan Chang           Date of Birth: 11-Aug-1996           MRN: [id number] Visit Date: 09/08/2022              Requested by: Inda Coke, [place] [address] [place],  [place] [address] PCP: Inda Coke, [place]   Assessment & Plan: Visit Diagnoses:  1. Acute low back pain, unspecified back pain laterality, unspecified whether sciatica present   2. 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  At this point, I would like to put the patient on a Medrol Dosepak and muscle relaxer.  I would like to start her in outpatient physical therapy as well.  If her symptoms do not improve over the next few months or happen to worsen in the meantime, she will let me know we will get an MRI of her neck and back.  Otherwise, follow-up as needed.  Follow-Up Instructions: Return if symptoms worsen or fail to improve.   Orders:  Orders Placed This Encounter  Procedures   XR Lumbar Spine 2-3 Views   Ambulatory referral to Physical Therapy   Meds ordered this encounter  Medications   methylPREDNISolone (MEDROL DOSEPAK) 4 MG TBPK tablet    Sig: Take as directed    Dispense:  21 tablet    Refill:  0   methocarbamol (ROBAXIN-750) 750 MG tablet    Sig: Take 1 tablet (750 mg total) by mouth 2 (two) times daily as needed for muscle spasms.    Dispense:  20 tablet    Refill:  2      Procedures: No procedures performed   Clinical Data: No additional findings.   Subjective: Chief Complaint  Patient presents with   Spine - Pain    HPI patient is a pleasant 26 year old female who comes in today with chronic left-sided neck and back pain.  This began about 2 years ago and is progressively worsened.  She notes the pain starts in the left side of her neck and radiates down her back on the left side.  She notes radiation into her left arm and left leg.  Symptoms are worse when she is  rotating her neck as well as with lumbar flexion and sitting on her left side.  She has recently tried muscle relaxers as well as naproxen, heat and ice without significant relief.  She notes occasional paresthesias to the left leg and left arm.  She has been seen by chiropractor but has not been to physical therapy or had an ESI.  She denies any bowel or bladder change or saddle paresthesias.  Review of Systems as detailed in HPI.  All others reviewed and are negative.   Objective: Vital Signs: There were no vitals taken for this visit.  Physical Exam well-developed well-nourished female no acute distress.  Alert and oriented x 3.  Ortho Exam cervical spine exam reveals no spinous or paraspinous tenderness.  She does have increased pain with flexion and rotation.  Lumbar spine shows diffuse spinous tenderness.  Increased pain with lumbar flexion.  Positive straight leg raise both sides.  No focal weakness.  She is neurovascular intact distally.  Specialty Comments:  No specialty comments available.  Imaging: No results found.   PMFS History: Patient Active Problem List   Diagnosis Date Noted   Symptomatic mammary hypertrophy 09/06/2022  Panic attack as reaction to stress 04/08/2022   Panniculitis 12/13/2021   History of gestational hypertension 09/14/2021   Anemia 09/14/2021   Attention deficit hyperactivity disorder (ADHD), 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   Gestational diabetes mellitus (GDM), antepartum 04/14/2021   Generalized anxiety disorder 03/11/2020   Moderate episode of recurrent major depressive disorder ([place]) 03/11/2020   Sleep difficulties 03/11/2020   External hemorrhoids 07/31/2016   Past Medical History:  Diagnosis Date   Anemia 09/14/2021   Anxiety    Depression    Endometriosis    Fibroid    Pilar Plate breech presentation 10/29/2016   Gestational diabetes    Migraines    Pregnancy induced hypertension    S/P cesarean section 10/29/2016    Family History  Problem  Relation Age of Onset   Heart disease Mother    Hypertension Mother    Anxiety disorder Mother    Depression Mother    Hypertension Father    Anxiety disorder Father    Depression Father    Testicular cancer Brother     Past Surgical History:  Procedure Laterality Date   ADENOIDECTOMY  10/29/2007   BREAST REDUCTION SURGERY     CESAREAN SECTION N/A 10/29/2016   Procedure: CESAREAN SECTION;  Surgeon: Janyth Contes, MD;  Location: [facility];  Service: Obstetrics;  Laterality: N/A;   CESAREAN SECTION N/A 07/25/2021   Procedure: Repeat CESAREAN SECTION;  Surgeon: Aloha Gell, MD;  Location: [facility];  Service: Obstetrics;  Laterality: N/A;   Repeat C/S BTL   LAPAROSCOPY  42/39/5320   NISSEN FUNDOPLICATION     RHINOPLASTY  03/22/2010   deviated septum   TONSILLECTOMY  2017   TUBAL LIGATION  07/25/2021   Social History   Occupational History   Occupation: Agricultural engineer  Tobacco Use   Smoking status: Never   Smokeless tobacco: Never  Vaping Use   Vaping Use: Never used  Substance and Sexual Activity   Alcohol use: No   Drug use: No   Sexual activity: Yes    Birth control/protection: None

## 2022-09-09 ENCOUNTER — Other Ambulatory Visit: Payer: Self-pay | Admitting: Physician Assistant

## 2022-09-09 MED ORDER — VORTIOXETINE HBR 5 MG PO TABS: 5.0000 mg | ORAL_TABLET | Freq: Every day | ORAL | 1 refills | Status: DC

## 2022-09-27 ENCOUNTER — Other Ambulatory Visit: Payer: Self-pay | Admitting: Physician Assistant

## 2022-09-28 ENCOUNTER — Other Ambulatory Visit: Payer: Self-pay | Admitting: Physician Assistant

## 2022-09-28 MED ORDER — LISDEXAMFETAMINE DIMESYLATE 70 MG PO CAPS: 70.0000 mg | ORAL_CAPSULE | Freq: Every day | ORAL | 0 refills | Status: DC

## 2022-09-30 ENCOUNTER — Encounter: Payer: Self-pay | Admitting: Physician Assistant

## 2022-09-30 ENCOUNTER — Telehealth (INDEPENDENT_AMBULATORY_CARE_PROVIDER_SITE_OTHER): Admitting: Physician Assistant

## 2022-09-30 ENCOUNTER — Other Ambulatory Visit (HOSPITAL_COMMUNITY): Payer: Self-pay

## 2022-09-30 VITALS — Ht 66.0 in | Wt 174.0 lb

## 2022-09-30 DIAGNOSIS — G479 Sleep disorder, unspecified: Secondary | ICD-10-CM

## 2022-09-30 DIAGNOSIS — F331 Major depressive disorder, recurrent, moderate: Secondary | ICD-10-CM

## 2022-09-30 DIAGNOSIS — F411 Generalized anxiety disorder: Secondary | ICD-10-CM | POA: Diagnosis not present

## 2022-09-30 DIAGNOSIS — F902 Attention-deficit hyperactivity disorder, combined type: Secondary | ICD-10-CM

## 2022-09-30 DIAGNOSIS — L309 Dermatitis, unspecified: Secondary | ICD-10-CM

## 2022-09-30 MED ORDER — LISDEXAMFETAMINE DIMESYLATE 70 MG PO CAPS
70.0000 mg | ORAL_CAPSULE | Freq: Every day | ORAL | 0 refills | Status: DC
  Filled 2022-09-30 – 2022-10-03 (×3): qty 30, 30d supply, fill #0

## 2022-09-30 MED ORDER — DESONIDE 0.05 % EX CREA
TOPICAL_CREAM | Freq: Two times a day (BID) | CUTANEOUS | 0 refills | Status: DC
  Filled 2022-09-30 – 2022-10-03 (×2): qty 30, 30d supply, fill #0

## 2022-09-30 MED ORDER — MIRTAZAPINE 15 MG PO TABS
15.0000 mg | ORAL_TABLET | Freq: Every day | ORAL | 1 refills | Status: DC
  Filled 2022-09-30 – 2022-10-03 (×3): qty 30, 30d supply, fill #0

## 2022-09-30 NOTE — Progress Notes (Signed)
I acted as a Education administrator for [employer], [name] Anselmo Pickler, LPN  Virtual Visit via Video Note   I Morgan Coke, PA, connected with  Morgan Chang  ([id number], 1996-06-27) on 09/30/22 at 10:00 AM EST by a video-enabled telemedicine application and verified that I am speaking with the correct person using two identifiers.  Location: Patient: Home Provider: [facility] office   I discussed the limitations of evaluation and management by telemedicine and the availability of in person appointments. The patient expressed understanding and agreed to proceed.    History of Present Illness: Morgan Chang is a 27 y.o. who identifies as a female who was assigned female at birth, and is being seen today for:  Anxiety Since last being here, she has decreased her seroquel to 50 mg daily and started Trintellix 5 mg. She has only been on Trintellix 5 mg x 1 week. Still not sleeping well. She is ready to transition to either remeron or ambien, as these have worked well for her in the past. She has stopped her depakote on her own without any obvious side effects. Denies SI/HI   ADHD Currently taking Vyvanse 70 mg daily and tolerating well She would like to continue this medication Has trouble finding it at times but otherwise no concerns   Eczema Has small spots of eczema on her face that she has used with OTC meds w/o relief     Problems:  Patient Active Problem List   Diagnosis Date Noted   Symptomatic mammary hypertrophy 09/06/2022   Panic attack as reaction to stress 04/08/2022   Panniculitis 12/13/2021   History of gestational hypertension 09/14/2021   Anemia 09/14/2021   Attention deficit hyperactivity disorder (ADHD), 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   Gestational diabetes mellitus (GDM), antepartum 04/14/2021   Generalized anxiety disorder 03/11/2020   Moderate episode of recurrent major depressive disorder ([place]) 03/11/2020   Sleep difficulties 03/11/2020    External hemorrhoids 07/31/2016    Allergies: No Known Allergies Medications:  Current Outpatient Medications:    desonide (DESOWEN) 0.05 % cream, Appy to affected area 1-2 (two) times daily., Disp: 30 g, Rfl: 0   lisdexamfetamine (VYVANSE) 70 MG capsule, Take 1 capsule (70 mg total) by mouth daily., Disp: 30 capsule, Rfl: 0   methocarbamol (ROBAXIN-750) 750 MG tablet, Take 1 tablet (750 mg total) by mouth 2 (two) times daily as needed for muscle spasms., Disp: 20 tablet, Rfl: 2   mirtazapine (REMERON) 15 MG tablet, Take 1 tablet (15 mg total) by mouth at bedtime., Disp: 30 tablet, Rfl: 1   naproxen (NAPROSYN) 500 MG tablet, as needed for mild pain., Disp: , Rfl:    QUEtiapine (SEROQUEL) 50 MG tablet, Take 1 tablet (50 mg total) by mouth at bedtime., Disp: 30 tablet, Rfl: 0   vortioxetine HBr (TRINTELLIX) 5 MG TABS tablet, Take 1 tablet (5 mg total) by mouth daily., Disp: 30 tablet, Rfl: 1  Observations/Objective: Patient is well-developed, well-nourished in no acute distress.  Resting comfortably  at home.  Head is normocephalic, atraumatic.  No labored breathing.  Speech is clear and coherent with logical content.  Patient is alert and oriented at baseline.    Assessment and Plan: Generalized anxiety disorder; Moderate episode of recurrent major depressive disorder (HCC) Improving Continue Trintellix 5 mg Recommend reaching out in 3-4 weeks if she would like to increase dose, sooner if concerns Denies SI/HI  Attention deficit hyperactivity disorder (ADHD), combined type Well controlled No red flags PDMP reivewed Refill Vyvanse 70  mg daily  Follow-up in 6 mo, sooner if concerns  Sleep difficulties Ongoing Decrease seroquel to 25 mg daily x 1 week then stop Start 15 mg remeron daily, may increase after 3 weeks if needed Follow-up in 3 weeks via mychart if needed -- sooner if concerns  5. Eczema, unspecified type Uncontrolled Start topical desonide 1-2 times daily, no more  than 7d use Follow-up if new/worsening  Follow Up Instructions: I discussed the assessment and treatment plan with the patient. The patient was provided an opportunity to ask questions and all were answered. The patient agreed with the plan and demonstrated an understanding of the instructions.  A copy of instructions were sent to the patient via MyChart unless otherwise noted below.   The patient was advised to call back or seek an in-person evaluation if the symptoms worsen or if the condition fails to improve as anticipated.  Morgan Chang, [place]

## 2022-09-30 NOTE — Patient Instructions (Signed)
It was great to see you!  Decrease Seroquel to 25 mg daily x 1 week, then stop  Start remeron 15 mg daily now Message me in 3 weeks if need change in dose, sooner if concerns  Also message me after taking trintellix for about 4 weeks if increase dosage needed

## 2022-10-03 ENCOUNTER — Other Ambulatory Visit (HOSPITAL_COMMUNITY): Payer: Self-pay

## 2022-10-07 ENCOUNTER — Encounter: Payer: Self-pay | Admitting: Physician Assistant

## 2022-10-07 ENCOUNTER — Other Ambulatory Visit: Payer: Self-pay | Admitting: Physician Assistant

## 2022-10-07 DIAGNOSIS — F411 Generalized anxiety disorder: Secondary | ICD-10-CM

## 2022-10-07 DIAGNOSIS — F331 Major depressive disorder, recurrent, moderate: Secondary | ICD-10-CM

## 2022-10-07 DIAGNOSIS — G479 Sleep disorder, unspecified: Secondary | ICD-10-CM

## 2022-10-07 MED ORDER — ZOLPIDEM TARTRATE 5 MG PO TABS
5.0000 mg | ORAL_TABLET | Freq: Every evening | ORAL | 0 refills | Status: DC | PRN
Start: 2022-10-07 — End: 2022-10-31

## 2022-10-18 ENCOUNTER — Emergency Department (HOSPITAL_BASED_OUTPATIENT_CLINIC_OR_DEPARTMENT_OTHER)
Admission: EM | Admit: 2022-10-18 | Discharge: 2022-10-18 | Disposition: A | Discharge status: Home / Self Care | Attending: Emergency Medicine | Admitting: Emergency Medicine

## 2022-10-18 ENCOUNTER — Other Ambulatory Visit: Payer: Self-pay

## 2022-10-18 ENCOUNTER — Emergency Department (HOSPITAL_BASED_OUTPATIENT_CLINIC_OR_DEPARTMENT_OTHER)

## 2022-10-18 ENCOUNTER — Encounter (HOSPITAL_BASED_OUTPATIENT_CLINIC_OR_DEPARTMENT_OTHER): Payer: Self-pay | Admitting: Emergency Medicine

## 2022-10-18 DIAGNOSIS — R1011 Right upper quadrant pain: Secondary | ICD-10-CM | POA: Diagnosis present

## 2022-10-18 DIAGNOSIS — N3000 Acute cystitis without hematuria: Secondary | ICD-10-CM | POA: Insufficient documentation

## 2022-10-18 LAB — URINALYSIS, ROUTINE W REFLEX MICROSCOPIC
Bilirubin Urine: NEGATIVE
Glucose, UA: NEGATIVE mg/dL
Ketones, ur: NEGATIVE mg/dL
Nitrite: POSITIVE — AB
Specific Gravity, Urine: 1.033 — ABNORMAL HIGH (ref 1.005–1.030)
WBC, UA: >50 WBC/hpf (ref 0–5)
pH: 6 (ref 5.0–8.0)

## 2022-10-18 LAB — LIPASE, BLOOD: Lipase: 10 U/L — ABNORMAL LOW (ref 11–51)

## 2022-10-18 LAB — COMPREHENSIVE METABOLIC PANEL
ALT: 12 U/L (ref 0–44)
AST: 16 U/L (ref 15–41)
Albumin: 4.7 g/dL (ref 3.5–5.0)
Alkaline Phosphatase: 46 U/L (ref 38–126)
Anion gap: 8 (ref 5–15)
BUN: 20 mg/dL (ref 6–20)
CO2: 23 mmol/L (ref 22–32)
Calcium: 9.6 mg/dL (ref 8.9–10.3)
Chloride: 108 mmol/L (ref 98–111)
Creatinine, Ser: 0.73 mg/dL (ref 0.44–1.00)
GFR, Estimated: >60 mL/min (ref >60)
Glucose, Bld: 98 mg/dL (ref 70–99)
Potassium: 4.1 mmol/L (ref 3.5–5.1)
Sodium: 139 mmol/L (ref 135–145)
Total Bilirubin: 0.7 mg/dL (ref 0.3–1.2)
Total Protein: 7.3 g/dL (ref 6.5–8.1)

## 2022-10-18 LAB — CBC
HCT: 39.5 % (ref 36.0–46.0)
Hemoglobin: 13.5 g/dL (ref 12.0–15.0)
MCH: 30.1 pg (ref 26.0–34.0)
MCHC: 34.2 g/dL (ref 30.0–36.0)
MCV: 88.2 fL (ref 80.0–100.0)
Platelets: 285 10*3/uL (ref 150–400)
RBC: 4.48 MIL/uL (ref 3.87–5.11)
RDW: 12.5 % (ref 11.5–15.5)
WBC: 4.6 10*3/uL (ref 4.0–10.5)
nRBC: 0 % (ref 0.0–0.2)

## 2022-10-18 LAB — HCG, SERUM, QUALITATIVE: Preg, Serum: NEGATIVE

## 2022-10-18 MED ORDER — ONDANSETRON HCL 4 MG PO TABS: 4.0000 mg | ORAL_TABLET | Freq: Three times a day (TID) | ORAL | 0 refills | Status: DC | PRN

## 2022-10-18 MED ORDER — CEPHALEXIN 500 MG PO CAPS: ORAL_CAPSULE | ORAL | 0 refills | Status: DC

## 2022-10-18 MED ORDER — PANTOPRAZOLE SODIUM 40 MG PO TBEC: 40.0000 mg | DELAYED_RELEASE_TABLET | Freq: Every day | ORAL | 1 refills | Status: DC

## 2022-10-18 MED ORDER — ONDANSETRON HCL 4 MG/2ML IJ SOLN
4.0000 mg | Freq: Once | INTRAMUSCULAR | Status: AC
  Administered 2022-10-18: 4 mg via INTRAVENOUS
  Filled 2022-10-18: qty 2

## 2022-10-18 MED ORDER — MORPHINE SULFATE (PF) 2 MG/ML IV SOLN
2.0000 mg | Freq: Once | INTRAVENOUS | Status: AC
  Administered 2022-10-18: 2 mg via INTRAVENOUS
  Filled 2022-10-18: qty 1

## 2022-10-18 NOTE — ED Triage Notes (Signed)
Pt arrives to ED with c/o RUQ abdominal pain that radiates to her back with pain that worsens when eating.

## 2022-10-18 NOTE — ED Provider Notes (Signed)
[facility] Provider Note   CSN: [id number] Arrival date & time: 10/18/22  5784     History  Chief Complaint  Patient presents with   Abdominal Pain    Morgan Chang is a 27 y.o. female.  The history is provided by the patient.  Abdominal Pain Pain location:  RUQ Pain quality: aching, bloating and gnawing   Pain radiates to:  Back and epigastric region Pain severity:  Severe Onset quality:  Gradual Duration:  1 week Progression:  Waxing and waning Chronicity:  New Context: not alcohol use and not suspicious food intake   Context comment:  Nsaid use Relieved by:  Nothing Worsened by:  Eating and palpation Ineffective treatments:  Antacids and OTC medications Associated symptoms: nausea   Associated symptoms: no vomiting   Risk factors: NSAID use   Risk factors: no alcohol abuse        Home Medications Prior to Admission medications   Medication Sig Start Date End Date Taking? Authorizing Provider  ondansetron (ZOFRAN) 4 MG tablet Take 1 tablet (4 mg total) by mouth every 8 (eight) hours as needed for nausea or vomiting. 10/18/22  Yes Annalisse Minkoff, [name]  pantoprazole (PROTONIX) 40 MG tablet Take 1 tablet (40 mg total) by mouth daily. 10/18/22  Yes Margarita Mail, [name]  cephALEXin (KEFLEX) 500 MG capsule 2 caps po bid x 7 days 10/18/22   Margarita Mail, [name]  desonide (DESOWEN) 0.05 % cream Appy to affected area 1-2 (two) times daily. 09/30/22   Inda Coke, PA  lisdexamfetamine (VYVANSE) 70 MG capsule Take 1 capsule (70 mg total) by mouth daily. 09/30/22   Inda Coke, PA  methocarbamol (ROBAXIN-750) 750 MG tablet Take 1 tablet (750 mg total) by mouth 2 (two) times daily as needed for muscle spasms. 09/08/22   Aundra Dubin, [name]  mirtazapine (REMERON) 15 MG tablet Take 1 tablet (15 mg total) by mouth at bedtime. 09/30/22   Inda Coke, PA  naproxen (NAPROSYN) 500 MG tablet as needed for mild pain.     [provider]  QUEtiapine (SEROQUEL) 50 MG tablet Take 1 tablet (50 mg total) by mouth at bedtime. 08/25/22   Inda Coke, PA  vortioxetine HBr (TRINTELLIX) 5 MG TABS tablet Take 1 tablet (5 mg total) by mouth daily. 09/09/22   Inda Coke, PA  zolpidem (AMBIEN) 5 MG tablet Take 1 tablet (5 mg total) by mouth at bedtime as needed for sleep. 10/07/22   Inda Coke, PA      Allergies    Patient has no known allergies.    Review of Systems   Review of Systems  Gastrointestinal:  Positive for abdominal pain and nausea. Negative for vomiting.    Physical Exam Updated Vital Signs BP 113/86 (BP Location: Right Arm)   Pulse (!) 50   Temp 98.4 F (36.9 C) (Oral)   Resp 16   Ht '5\' 6"'$  (1.676 m)   Wt 77.1 kg   LMP  (LMP Unknown)   SpO2 100%   BMI 27.44 kg/m  Physical Exam Vitals and nursing note reviewed.  Constitutional:      General: She is not in acute distress.    Appearance: She is well-developed. She is not diaphoretic.  HENT:     Head: Normocephalic and atraumatic.     Right Ear: External ear normal.     Left Ear: External ear normal.     Nose: Nose normal.     Mouth/Throat:  Mouth: Mucous membranes are moist.  Eyes:     General: No scleral icterus.    Conjunctiva/sclera: Conjunctivae normal.  Cardiovascular:     Rate and Rhythm: Normal rate and regular rhythm.     Heart sounds: Normal heart sounds. No murmur heard.    No friction rub. No gallop.  Pulmonary:     Effort: Pulmonary effort is normal. No respiratory distress.     Breath sounds: Normal breath sounds.  Abdominal:     General: Bowel sounds are normal. There is no distension.     Palpations: Abdomen is soft. There is no mass.     Tenderness: There is abdominal tenderness in the right upper quadrant. There is no guarding.  Musculoskeletal:     Cervical back: Normal range of motion.  Skin:    General: Skin is warm and dry.  Neurological:     Mental Status: She is alert and  oriented to person, place, and time.  Psychiatric:        Behavior: Behavior normal.     ED Results / Procedures / Treatments   Labs (all labs ordered are listed, but only abnormal results are displayed) Labs Reviewed  LIPASE, BLOOD - Abnormal; Notable for the following components:      Result Value   Lipase 10 (*)    All other components within normal limits  URINALYSIS, ROUTINE W REFLEX MICROSCOPIC - Abnormal; Notable for the following components:   Specific Gravity, Urine 1.033 (*)    Hgb urine dipstick MODERATE (*)    Protein, ur TRACE (*)    Nitrite POSITIVE (*)    Leukocytes,Ua MODERATE (*)    Bacteria, UA MANY (*)    All other components within normal limits  COMPREHENSIVE METABOLIC PANEL  CBC  HCG, SERUM, QUALITATIVE    EKG None  Radiology US Abdomen Limited RUQ (LIVER/GB)  Result Date: 10/18/2022 CLINICAL DATA:  Right upper quadrant abdominal pain EXAM: ULTRASOUND ABDOMEN LIMITED RIGHT UPPER QUADRANT COMPARISON:  None Available. FINDINGS: Gallbladder: No gallstones or wall thickening visualized. No sonographic Murphy sign noted by sonographer. Common bile duct: Diameter: Normal caliber, 3 mm Liver: No focal lesion identified. Within normal limits in parenchymal echogenicity. Portal vein is patent on color Doppler imaging with normal direction of blood flow towards the liver. Other: None. IMPRESSION: Normal study Electronically Signed   By: Rolm Baptise M.D.   On: 10/18/2022 10:25    Procedures Ultrasound ED Abd  Date/Time: 10/18/2022 9:50 AM  Performed by: Margarita Mail, [name] Authorized by: Margarita Mail, [name]   Procedure details:    Indications: abdominal pain and back pain     Assessment for:  Gallstones   Hepatobiliary:  Visualized    Images: archived   Study Limitations: bowel gas Hepatobiliary findings:    Common bile duct:  Normal   Gallbladder wall:  Normal   Gallbladder stones: not identified     Mass: not identified     Intra-abdominal fluid:  not identified     Polyps: not identified     Sonographic Murphy's sign: positive       Medications Ordered in ED Medications  morphine (PF) 2 MG/ML injection 2 mg (2 mg Intravenous Given 10/18/22 1017)  ondansetron (ZOFRAN) injection 4 mg (4 mg Intravenous Given 10/18/22 1015)    ED Course/ Medical Decision Making/ A&P Clinical Course as of 10/18/22 1132  Tue Oct 18, 2022  1039 US Abdomen Limited RUQ (LIVER/GB) [AH]  1040 Urinalysis, Routine w reflex microscopic -Urine, Clean Catch(!) [  AH]  1040 hCG, serum, qualitative [AH]  1040 Comprehensive metabolic panel [AH]  5784 CBC [AH]    Clinical Course User Index [AH] Margarita Mail, [name]                             Medical Decision Making 27 year old female who presents emergency department with epigastric and right upper quadrant abdominal pain. The differential diagnosis for RUQ is, but not limited to:  Cholelithiasis, cholecystitis, cholangitis, choledocholithiasis, hepatitis, pancreatitis, RLL pneumonia, pyelonephritis, urinary calculi, PUD,abdominal/liver abscess, musculoskeletal pain, herpes zoster.  I reviewed the patient's labs.  She is negative pregnancy test, CBC and CMP within normal limits, lipase within normal limits Urine does appear to show infection.  Patient reports a history of recurrent urinary tract infections.  She is asymptomatic at this time, denies flank pain.  I performed POCUS US of the right upper quadrant which showed no evidence of gallbladder wall thickening. Formal ultrasound which I visualized and interpreted confirms post POCUS exam of no acute findings.  Given patient's history I still suspect biliary colic however she may also be having ulcer given her postprandial abdominal pain.  Will have the patient follow closely with GI specialist whom I have given her referral to as well as her PCP.  She may need a HIDA scan next.  I have advised her on appropriate eating plan and dietary modifications.  Will  discharge with Keflex for her UTI.  Review of EMR shows previous UTI positive for Staph saprophyticus that was pansensitive to all agents.  Patient's pain is completely resolved after 2 mg of IV morphine and Zofran.  Discussed outpatient follow-up and return precautions.  Amount and/or Complexity of Data Reviewed Labs: ordered. Decision-making details documented in ED Course. Radiology: ordered. Decision-making details documented in ED Course.  Risk Prescription drug management.           Final Clinical Impression(s) / ED Diagnoses Final diagnoses:  RUQ abdominal pain  Acute cystitis without hematuria    Rx / DC Orders ED Discharge Orders          Ordered    cephALEXin (KEFLEX) 500 MG capsule  Status:  Discontinued        10/18/22 1102    pantoprazole (PROTONIX) 40 MG tablet  Daily        10/18/22 1102    ondansetron (ZOFRAN) 4 MG tablet  Every 8 hours PRN        10/18/22 1102    cephALEXin (KEFLEX) 500 MG capsule        10/18/22 1106              Margarita Mail, [name] 10/18/22 1132    Elnora Morrison, MD 10/18/22 1542

## 2022-10-18 NOTE — ED Notes (Signed)
Discharge paperwork given and verbally understood. 

## 2022-10-18 NOTE — Discharge Instructions (Signed)
Contact a health care provider if: Your pain lasts more than 5 hours. You vomit. You have a fever and chills. Your pain gets worse. Get help right away if: Your skin or the whites of your eyes look yellow (jaundice). Your have tea-colored urine and light-colored stools (feces). You are dizzy or you faint.

## 2022-10-20 ENCOUNTER — Telehealth: Payer: Self-pay

## 2022-10-20 ENCOUNTER — Ambulatory Visit: Admitting: Physician Assistant

## 2022-10-20 NOTE — Telephone Encounter (Signed)
Called patient and went over her insurance requirements for BL breast reduction. She is currently in her 3rd week of PT and should be finished with her 6 weeks 11/16/2022. Will need to set up f/u appointment with PA after she has completed her therapy.

## 2022-10-31 ENCOUNTER — Other Ambulatory Visit: Payer: Self-pay | Admitting: Physician Assistant

## 2022-10-31 ENCOUNTER — Encounter: Payer: Self-pay | Admitting: Physician Assistant

## 2022-10-31 MED ORDER — ZOLPIDEM TARTRATE 10 MG PO TABS: 10.0000 mg | ORAL_TABLET | Freq: Every evening | ORAL | 0 refills | Status: DC | PRN

## 2022-11-01 ENCOUNTER — Other Ambulatory Visit (HOSPITAL_COMMUNITY): Payer: Self-pay

## 2022-11-01 MED ORDER — ZOLPIDEM TARTRATE 10 MG PO TABS
10.0000 mg | ORAL_TABLET | Freq: Every evening | ORAL | 0 refills | Status: DC | PRN
  Filled 2022-11-01 (×2): qty 30, 30d supply, fill #0

## 2022-11-01 MED ORDER — LISDEXAMFETAMINE DIMESYLATE 70 MG PO CAPS
70.0000 mg | ORAL_CAPSULE | Freq: Every day | ORAL | 0 refills | Status: DC
  Filled 2022-11-01: qty 30, 30d supply, fill #0

## 2022-11-01 NOTE — Telephone Encounter (Signed)
Pt requesting refill for Vyvanse 70 mg capsule. Last OV 09/30/2022.

## 2022-11-01 NOTE — Telephone Encounter (Signed)
Please see message and resend Ambien. Pharmacy was updated.

## 2022-11-03 ENCOUNTER — Encounter: Admitting: Radiology

## 2022-11-17 ENCOUNTER — Telehealth: Payer: Self-pay

## 2022-11-17 NOTE — Telephone Encounter (Signed)
Called patient to follow up on PT if completed. If so, call our office to schedule appointment with one of our PA's

## 2022-11-28 ENCOUNTER — Other Ambulatory Visit (HOSPITAL_COMMUNITY): Payer: Self-pay

## 2022-11-28 ENCOUNTER — Other Ambulatory Visit: Payer: Self-pay | Admitting: Physician Assistant

## 2022-11-28 MED ORDER — LISDEXAMFETAMINE DIMESYLATE 70 MG PO CAPS
70.0000 mg | ORAL_CAPSULE | Freq: Every day | ORAL | 0 refills | Status: DC
  Filled 2022-11-28 – 2022-12-07 (×2): qty 30, 30d supply, fill #0

## 2022-11-28 MED ORDER — ZOLPIDEM TARTRATE 10 MG PO TABS
10.0000 mg | ORAL_TABLET | Freq: Every evening | ORAL | 0 refills | Status: DC | PRN
  Filled 2022-11-28 – 2022-11-29 (×2): qty 30, 30d supply, fill #0

## 2022-11-28 NOTE — Telephone Encounter (Signed)
Requesting refills for Vyvanse 70 mg and Zolpidem 10 mg. Last OV  09/30/2022.

## 2022-11-29 ENCOUNTER — Other Ambulatory Visit (HOSPITAL_COMMUNITY): Payer: Self-pay

## 2022-11-29 ENCOUNTER — Other Ambulatory Visit: Payer: Self-pay

## 2022-12-01 ENCOUNTER — Telehealth (HOSPITAL_BASED_OUTPATIENT_CLINIC_OR_DEPARTMENT_OTHER): Admitting: Psychiatry

## 2022-12-01 DIAGNOSIS — F411 Generalized anxiety disorder: Secondary | ICD-10-CM | POA: Diagnosis not present

## 2022-12-01 DIAGNOSIS — F430 Acute stress reaction: Secondary | ICD-10-CM

## 2022-12-01 DIAGNOSIS — F410 Panic disorder [episodic paroxysmal anxiety] without agoraphobia: Secondary | ICD-10-CM

## 2022-12-01 MED ORDER — VORTIOXETINE HBR 10 MG PO TABS: 10.0000 mg | ORAL_TABLET | Freq: Every day | ORAL | 1 refills | Status: DC

## 2022-12-01 MED ORDER — GABAPENTIN 100 MG PO CAPS: 100.0000 mg | ORAL_CAPSULE | Freq: Three times a day (TID) | ORAL | 1 refills | Status: DC | PRN

## 2022-12-01 NOTE — Progress Notes (Signed)
Psychiatric Initial Adult Assessment   Patient Identification: Morgan Chang MRN:  [id number] Date of Evaluation:  12/02/2022 Referral Source: PCP Chief Complaint:   Chief Complaint  Patient presents with   Establish Care   Anxiety   Visit Diagnosis:    ICD-10-CM   1. Generalized anxiety disorder  F41.1 Vitamin D (25 hydroxy)    gabapentin (NEURONTIN) 100 MG capsule    vortioxetine HBr (TRINTELLIX) 10 MG TABS tablet    2. Panic attack as reaction to stress  F41.0    F43.0        Assessment:  Morgan Chang is a 27 y.o. female with a history of MDD, sleep difficulties and reported ADHD who presents virtually to [place] [facility] for initial evaluation on 12/02/2022.  Patient reports significant symptoms of anxiety including excessive worry control, difficulty relaxing, racing thoughts worse at night that affects sleep, restlessness, increased irritability, and fears about happening.  She also experiences panic attacks which started in December 2023 and occur once every couple days.  During the episodes of panic patient endorses symptoms of diaphoresis, increased restlessness, picking behaviors, and dissociations.  Patient also does endorse some depressive symptoms, number that appear to be a bit better managed after starting the Trintellix.  She denies any SI or thoughts of self-harm along with any history of mania or psychosis. Patient meets criteria for generalized anxiety disorder and panic attacks.  She could benefit from medication adjustments and behavioral activation techniques.  A number of assessments were performed during the evaluation today including PHQ-9 which they scored a 11 on, GAD-7 which they scored a 20 on, and [place] suicide severity screening which showed no risk.  Based on these assessments patient would benefit from medication adjustment to better target their symptoms.  Plan: - Increase Trintellix 10 mg  - Start gabapentin 100 mg  TID prn for anxiety - Continue Vyvanse 70 mg QD managed by her PCP - Continue Ambien 10 mg managed by PCP - Vit D ordered - CMP, CBC, TSH, reviewed - Crisis resources reviewed - Follow up in a month  History of Present Illness: Morgan Chang presents reporting she is having anxiety as long as she can remember and has gotten worse in the last year.  In regards to her baseline anxiety she started medications in high school and has been on them since.  She had also been diagnosed with ADHD as a kid.  She tried a number of medications some of which had success however they would wear off over time. She believes that the more severe anxiety with panic began around summer 2023 with no identifiable triggers for the change. In addition to the baseline anxiety she began to experience panic attacks that typically occurred in the afternoon/evening every other day. She describes symptoms of diaphoresis, restlessness, picking behaviors, and dissociations during her panic episodes that can last around 30 minutes. Patient finds that the panic episodes can occur when overstimulated or with no identifiable triggers. She has tried various coping mechanisms during the episodes with intermittent success. Around that time the panic attacks began she connected with a psychiatric provider at [facility] and was diagnosed with bipolar disorder.  Patient is been started on Seroquel and Depakote at that time however found that the Seroquel increased her irritability the Depakote made her mood fluctuate excessively.  Explored history of mania and while patient does endorse poor sleep she notes the longest period without sleep being 48 hours and she is typically more fatigued the  second day.  She also denies any grandiosity, reckless behaviors, or impulsivity.  She had 1 episode of potential hallucinations though notes that she had been sleep deprived and questions whether she was delirious.  That has not happened since.  Also of note patient has  been on a number of antidepressants unopposed without progression to mania.  After finally leaving her former psychiatric provider she returned to her PCP for medication adjustments and had tried a number of antidepressant medications.   We discussed treatment options moving forward and patient reported benefit from the Vyvanse and Ambien for sleep and her ADHD symptoms. As for the Trintellix she had noticed some minor improvement with no adverse side effects. She was agreeable to increasing the Trintellix while monitoring for any further improvement.  We also discussed starting a prn medication to help with anxiety attacks. She has had limited benefit with past prn options outside of Klonopin. Patient was open to a trial of gabapentin and risks/benefits were reviewed.  We also discussed behavioral activation techniques patient notes that when she exercises her mood and anxiety symptoms had improved.  She was encouraged to try to carve out for 30 minutes a day for self-care.  Associated Signs/Symptoms: Depression Symptoms:  insomnia, fatigue, feelings of worthlessness/guilt, difficulty concentrating, anxiety, (Hypo) Manic Symptoms:  Irritable Mood, Anxiety Symptoms:  Excessive Worry, Panic Symptoms, Psychotic Symptoms:   Denies PTSD Symptoms: Negative  Past Psychiatric History: The patient denies any prior psychiatric hospitalizations or suicide attempts.  She has been connected with a couple therapists in the past, though is currently not seeing anyone after her last therapist left the practice in January.   She has tried mirtazapine, Wellbutrin, Prozac, Zoloft, Lexapro (headaches), Celexa, BuSpar, propranolol, Atarax (sleepy), Seroquel, trazodone, Depakote, Adderall, Ritalin, Klonopin, Xanax, Lunesta  Patient currently taking Vyvanse, Trintellix, Ambien, and gabapentin.  She denies any substance use denies other than one drink   Previous Psychotropic Medications: Yes   Substance Abuse  History in the last 12 months:  No.  Consequences of Substance Abuse: NA  Past Medical History:  Past Medical History:  Diagnosis Date   Anemia 09/14/2021   Anxiety    Depression    Endometriosis    Fibroid    Frank breech presentation 10/29/2016   Gestational diabetes    Migraines    Pregnancy induced hypertension    S/P cesarean section 10/29/2016    Past Surgical History:  Procedure Laterality Date   ADENOIDECTOMY  10/29/2007   BREAST REDUCTION SURGERY     CESAREAN SECTION N/A 10/29/2016   Procedure: CESAREAN SECTION;  Surgeon: Janyth Contes, MD;  Location: [facility];  Service: Obstetrics;  Laterality: N/A;   CESAREAN SECTION N/A 07/25/2021   Procedure: Repeat CESAREAN SECTION;  Surgeon: Aloha Gell, MD;  Location: [facility];  Service: Obstetrics;  Laterality: N/A;   Repeat C/S BTL   LAPAROSCOPY  [id number]   NISSEN FUNDOPLICATION     RHINOPLASTY  03/22/2010   deviated septum   TONSILLECTOMY  2017   TUBAL LIGATION  07/25/2021    Family Psychiatric History: as below  Family History:  Family History  Problem Relation Age of Onset   Heart disease Mother    Hypertension Mother    Anxiety disorder Mother    Depression Mother    Hypertension Father    Anxiety disorder Father    Depression Father    Testicular cancer Brother     Social History:   Social History   Socioeconomic History  Marital status: Married    Spouse name: Not on file   Number of children: Not on file   Years of education: Not on file   Highest education level: Not on file  Occupational History   Occupation: Homemaker  Tobacco Use   Smoking status: Never   Smokeless tobacco: Never  Vaping Use   Vaping Use: Never used  Substance and Sexual Activity   Alcohol use: No   Drug use: No   Sexual activity: Yes    Birth control/protection: None  Other Topics Concern   Not on file  Social History Narrative   Pre-school teacher   Social Determinants of Health    Financial Resource Strain: Not on file  Food Insecurity: Food Insecurity Present (04/14/2021)   Hunger Vital Sign    Worried About Running Out of Food in the Last Year: Never true    Ran Out of Food in the Last Year: Sometimes true  Transportation Needs: Not on file  Physical Activity: Not on file  Stress: Not on file  Social Connections: Not on file    Additional Social History: Patient is married and has 2 kids who she lives with.  She had a good childhood and grew up and went to college.  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  Since then she has been a stay-at-home mother.  She reports that her mom and brother were not far away and that she enjoys exercising when she is able to.  Allergies:  No Known Allergies  Metabolic Disorder Labs: Lab Results  Component Value Date   HGBA1C 5.4 03/30/2022   No results found for: "PROLACTIN" No results found for: "CHOL", "TRIG", "HDL", "CHOLHDL", "VLDL", "LDLCALC" Lab Results  Component Value Date   TSH 1.34 03/30/2022    Therapeutic Level Labs: No results found for: "LITHIUM" No results found for: "CBMZ" No results found for: "VALPROATE"  Current Medications: Current Outpatient Medications  Medication Sig Dispense Refill   gabapentin (NEURONTIN) 100 MG capsule Take 1 capsule (100 mg total) by mouth 3 (three) times daily as needed. 90 capsule 1   cephALEXin (KEFLEX) 500 MG capsule 2 caps po bid x 7 days 28 capsule 0   desonide (DESOWEN) 0.05 % cream Appy to affected area 1-2 (two) times daily. 30 g 0   lisdexamfetamine (VYVANSE) 70 MG capsule Take 1 capsule (70 mg total) by mouth daily. 30 capsule 0   methocarbamol (ROBAXIN-750) 750 MG tablet Take 1 tablet (750 mg total) by mouth 2 (two) times daily as needed for muscle spasms. 20 tablet 2   naproxen (NAPROSYN) 500 MG tablet as needed for mild pain.     ondansetron (ZOFRAN) 4 MG tablet Take 1 tablet (4 mg total) by mouth  every 8 (eight) hours as needed for nausea or vomiting. 10 tablet 0   pantoprazole (PROTONIX) 40 MG tablet Take 1 tablet (40 mg total) by mouth daily. 30 tablet 1   vortioxetine HBr (TRINTELLIX) 10 MG TABS tablet Take 1 tablet (10 mg total) by mouth daily. 90 tablet 1   zolpidem (AMBIEN) 10 MG tablet Take 1 tablet (10 mg total) by mouth at bedtime as needed for sleep. 30 tablet 0   No current facility-administered medications for this visit.    Psychiatric Specialty Exam: Review of Systems  not currently breastfeeding.There is no height or weight on file to calculate BMI.  General Appearance: Well Groomed  Eye Contact:  Good  Speech:  Clear and Coherent and Pressured  Volume:  Normal  Mood:  Anxious  Affect:  Congruent  Thought Process:  Coherent, Goal Directed, and Linear  Orientation:  Full (Time, Place, and Person)  Thought Content:  Logical  Suicidal Thoughts:  No  Homicidal Thoughts:  No  Memory:  Immediate;   Good  Judgement:  Good  Insight:  Fair  Psychomotor Activity:  Normal  Concentration:  Concentration: Good  Recall:  Good  Fund of Knowledge:Good  Language: Good  Akathisia:  NA    AIMS (if indicated):  not done  Assets:  Communication Skills Desire for Improvement Financial Resources/Insurance [place] Talents/Skills Transportation  ADL's:  Intact  Cognition: WNL  Sleep:  Fair   Screenings: GAD-7    Flowsheet Row Video Visit from 12/01/2022 in [facility] Video Visit from 09/30/2022 in [facility] Visit from 08/25/2022 in [facility] Visit from 12/28/2021 in [facility]  Total GAD-7 Score 20 3 21 20       PHQ2-9    Flowsheet Row Video Visit from 12/01/2022 in [facility] Office Visit from 08/25/2022 in [facility] Visit from  12/28/2021 in [facility] from 04/14/2021 in [place] at [facility] Total Score 1 6 0 0  PHQ-9 Total Score 11 18 6  --      Flowsheet Row Video Visit from 12/01/2022 in [facility] ED from 10/18/2022 in [facility] ED from 07/21/2022 in [facility])  [place] No Risk No Risk No Risk        Collaboration of Care: Medication Management AEB medication prescription and Primary Care Provider AEB chart review  Patient/Guardian was advised Release of Information must be obtained prior to any record release in order to collaborate their care with an outside provider. Patient/Guardian was advised if they have not already done so to contact the registration department to sign all necessary forms in order for [place] to release information regarding their care.   Consent: Patient/Guardian gives verbal consent for treatment and assignment of benefits for services provided during this visit. Patient/Guardian expressed understanding and agreed to proceed.   Vista Mink, MD 3/15/202412:54 PM  70 minutes were spent in chart review, interview, psycho education, counseling, medical decision making, coordination of care and long-term prognosis.  Patient was given opportunity to ask question and all concerns and questions were addressed and answers. Excluding separately billable services.   Virtual Visit via Video Note  I connected with Elpidio Anis on 12/02/22 at  2:00 PM EDT by a video enabled telemedicine application and verified that I am speaking with the correct person using two identifiers.  Location: Patient: Home Provider: Home office   I discussed the limitations of evaluation and management by telemedicine and the availability of in person appointments. The patient expressed  understanding and agreed to proceed.   I discussed the assessment and treatment plan with the patient. The patient was provided an opportunity to ask questions and all were answered. The patient agreed with the plan and demonstrated an understanding of the instructions.   The patient was advised to call back or seek an in-person evaluation if the symptoms worsen or if the condition fails to improve as anticipated.  I provided 55 minutes of non-face-to-face time during  this encounter.   Vista Mink, MD

## 2022-12-02 ENCOUNTER — Encounter (HOSPITAL_COMMUNITY): Payer: Self-pay | Admitting: Psychiatry

## 2022-12-02 DIAGNOSIS — F410 Panic disorder [episodic paroxysmal anxiety] without agoraphobia: Secondary | ICD-10-CM | POA: Insufficient documentation

## 2022-12-06 ENCOUNTER — Telehealth: Payer: Self-pay

## 2022-12-06 ENCOUNTER — Encounter: Payer: Self-pay | Admitting: Gastroenterology

## 2022-12-06 ENCOUNTER — Ambulatory Visit: Admitting: Gastroenterology

## 2022-12-06 NOTE — Telephone Encounter (Signed)
Called, LMVM to see if patient has completed PT

## 2022-12-07 ENCOUNTER — Other Ambulatory Visit (HOSPITAL_COMMUNITY): Payer: Self-pay

## 2022-12-21 ENCOUNTER — Ambulatory Visit: Admitting: Physician Assistant

## 2022-12-27 ENCOUNTER — Ambulatory Visit: Admitting: Physician Assistant

## 2022-12-28 ENCOUNTER — Other Ambulatory Visit: Payer: Self-pay | Admitting: Physician Assistant

## 2022-12-28 ENCOUNTER — Other Ambulatory Visit (HOSPITAL_COMMUNITY): Payer: Self-pay

## 2022-12-28 MED ORDER — LISDEXAMFETAMINE DIMESYLATE 70 MG PO CAPS
70.0000 mg | ORAL_CAPSULE | Freq: Every day | ORAL | 0 refills | Status: DC
  Filled 2022-12-28 – 2023-01-13 (×2): qty 30, 30d supply, fill #0

## 2022-12-28 MED ORDER — ZOLPIDEM TARTRATE 10 MG PO TABS
10.0000 mg | ORAL_TABLET | Freq: Every evening | ORAL | 0 refills | Status: DC | PRN
  Filled 2022-12-28: qty 30, 30d supply, fill #0

## 2022-12-28 NOTE — Telephone Encounter (Signed)
Pt requesting refills, last OV 09/30/2022.

## 2023-01-03 ENCOUNTER — Encounter (HOSPITAL_COMMUNITY): Admitting: Psychiatry

## 2023-01-03 NOTE — Progress Notes (Signed)
This encounter was created in error - please disregard.

## 2023-01-12 ENCOUNTER — Encounter (HOSPITAL_COMMUNITY): Payer: Self-pay | Admitting: Psychiatry

## 2023-01-12 ENCOUNTER — Telehealth (HOSPITAL_BASED_OUTPATIENT_CLINIC_OR_DEPARTMENT_OTHER): Admitting: Psychiatry

## 2023-01-12 DIAGNOSIS — F430 Acute stress reaction: Secondary | ICD-10-CM | POA: Diagnosis not present

## 2023-01-12 DIAGNOSIS — F410 Panic disorder [episodic paroxysmal anxiety] without agoraphobia: Secondary | ICD-10-CM | POA: Diagnosis not present

## 2023-01-12 DIAGNOSIS — F411 Generalized anxiety disorder: Secondary | ICD-10-CM | POA: Diagnosis not present

## 2023-01-12 MED ORDER — GABAPENTIN 100 MG PO CAPS: 200.0000 mg | ORAL_CAPSULE | Freq: Three times a day (TID) | ORAL | 1 refills | Status: DC | PRN

## 2023-01-12 NOTE — Progress Notes (Signed)
BH MD/PA/NP OP Progress Note  01/12/2023 9:44 AM Morgan Chang  MRN:  [id number]  Visit Diagnosis:    ICD-10-CM   1. Generalized anxiety disorder  F41.1 gabapentin (NEURONTIN) 100 MG capsule    2. Panic attack as reaction to stress  F41.0    F43.0       Assessment: Morgan Chang is a 27 y.o. female with a history of MDD, sleep difficulties and reported ADHD who presented to [facility] at [facility] for initial evaluation on 12/02/2022.  During initial evaluation patient reported significant symptoms of anxiety including excessive worry control, difficulty relaxing, racing thoughts worse at night that affects sleep, restlessness, increased irritability, and fears about happening.  She also endorsed experiencing panic attacks which started in December 2023 and occur once every couple days.  During the episodes of panic patient reportd symptoms of diaphoresis, increased restlessness, picking behaviors, and dissociations.  Patient also does note some depressive symptoms, that appear to be a bit better managed after starting the Trintellix.  She denies any SI or thoughts of self-harm along with any history of mania or psychosis. Patient met criteria for generalized anxiety disorder and panic attacks.  Morgan Chang presents for follow-up evaluation. Today, 01/12/23, patient reports an improvement in her anxiety and panic symptoms over the past month.  She can still have episodes where she feels overwhelmed and excessively stressed but they have not progressed to panic since increasing the Trintellix.  She had found the gabapentin to initially be helpful however the effects wore off over time.  Patient denies any adverse side effects from the medication.  We will continue Trintellix to 10 mg and increase gabapentin to 200 mg 3 times a day as needed for anxiety.  Risk and benefits of the increase gabapentin were reviewed.  Plan: - Continue Trintellix 10 mg  - Increase  gabapentin to 200 mg TID prn for anxiety - Continue Vyvanse 70 mg QD managed by her PCP - Continue Ambien 10 mg managed by PCP - Vit D ordered - CMP, CBC, TSH, reviewed - Crisis resources reviewed - Follow up in a month   Chief Complaint:  Chief Complaint  Patient presents with   Follow-up   HPI: Morgan Chang presents reporting that things have been really good over the past month.  It took her a few weeks to get the increased dose of Trintellix but after starting it she has felt that it has helped a lot.  She has noticed that her overall anxiety levels seem to be a bit better and the panic attacks have decreased.  Patient reports that she can still get overwhelmed and anxious at times but does not progress to the panic episode she had experienced previously.  She has also been taking gabapentin more often in the afternoon and evenings.  Initially 100 mg was very helpful in allowing her to calm down and feel more relaxed despite everything going on in the house.  Over time however the dose became less effective and did not seem to get the same response.  She denies any adverse side effects from either increase Trintellix or gabapentin.  We did explain that she tolerates takes 4 to 6 weeks to reach its max efficacy and would recommend continuing on her current dose.  As for the gabapentin we discussed options such as increasing the dose to 200 mg as needed which patient was interested in.  Risk and benefits were discussed.  Patient's only other concern is about sleep noting that  the Ambien has been less effective lately.  We did discuss this medication and explained that her body can develop tolerance over time especially when using it daily.  We also did review how increased anxiety with racing thoughts can affect sleep as well.  It was suggested that she try taking the increased dose of gabapentin around the time she takes Ambien to help with sleep symptoms.  We did review the interaction of these  medications and the potential increase in sedation.  Past Psychiatric History: The patient denies any prior psychiatric hospitalizations or suicide attempts.  She has been connected with a couple therapists in the past, though is currently not seeing anyone after her last therapist left the practice in January.   She has tried mirtazapine, Wellbutrin, Prozac, Zoloft, Lexapro (headaches), Celexa, BuSpar, propranolol, Atarax (sleepy), Seroquel, trazodone, Depakote, Adderall, Ritalin, Klonopin, Xanax, Lunesta  Patient currently taking Vyvanse, Trintellix, Ambien, and gabapentin.  She denies any substance use denies other than one alcoholic drink a week.   Past Medical History:  Past Medical History:  Diagnosis Date   Anemia 09/14/2021   Anxiety    Depression    Endometriosis    Fibroid    Frank breech presentation 10/29/2016   Gestational diabetes    Migraines    Pregnancy induced hypertension    S/P cesarean section 10/29/2016    Past Surgical History:  Procedure Laterality Date   ADENOIDECTOMY  10/29/2007   BREAST REDUCTION SURGERY     CESAREAN SECTION N/A 10/29/2016   Procedure: CESAREAN SECTION;  Surgeon: Sherian Rein, MD;  Location: [facility];  Service: Obstetrics;  Laterality: N/A;   CESAREAN SECTION N/A 07/25/2021   Procedure: Repeat CESAREAN SECTION;  Surgeon: Noland Fordyce, MD;  Location: [facility];  Service: Obstetrics;  Laterality: N/A;   Repeat C/S BTL   LAPAROSCOPY  12/10/2014   NISSEN FUNDOPLICATION     RHINOPLASTY  03/22/2010   deviated septum   TONSILLECTOMY  2017   TUBAL LIGATION  07/25/2021    Family History:  Family History  Problem Relation Age of Onset   Heart disease Mother    Hypertension Mother    Anxiety disorder Mother    Depression Mother    Hypertension Father    Anxiety disorder Father    Depression Father    Testicular cancer Brother     Social History:  Social History   Socioeconomic History   Marital status:  Married    Spouse name: Not on file   Number of children: Not on file   Years of education: Not on file   Highest education level: Not on file  Occupational History   Occupation: Homemaker  Tobacco Use   Smoking status: Never   Smokeless tobacco: Never  Vaping Use   Vaping Use: Never used  Substance and Sexual Activity   Alcohol use: No   Drug use: No   Sexual activity: Yes    Birth control/protection: None  Other Topics Concern   Not on file  Social History Narrative   Pre-school teacher   Social Determinants of Health   Financial Resource Strain: Not on file  Food Insecurity: Food Insecurity Present (04/14/2021)   Hunger Vital Sign    Worried About Running Out of Food in the Last Year: Never true    Ran Out of Food in the Last Year: Sometimes true  Transportation Needs: Not on file  Physical Activity: Not on file  Stress: Not on file  Social Connections: Not on  file    Allergies: No Known Allergies  Current Medications: Current Outpatient Medications  Medication Sig Dispense Refill   cephALEXin (KEFLEX) 500 MG capsule 2 caps po bid x 7 days 28 capsule 0   desonide (DESOWEN) 0.05 % cream Appy to affected area 1-2 (two) times daily. 30 g 0   gabapentin (NEURONTIN) 100 MG capsule Take 2 capsules (200 mg total) by mouth 3 (three) times daily as needed. 180 capsule 1   lisdexamfetamine (VYVANSE) 70 MG capsule Take 1 capsule (70 mg total) by mouth daily. 30 capsule 0   methocarbamol (ROBAXIN-750) 750 MG tablet Take 1 tablet (750 mg total) by mouth 2 (two) times daily as needed for muscle spasms. 20 tablet 2   naproxen (NAPROSYN) 500 MG tablet as needed for mild pain.     ondansetron (ZOFRAN) 4 MG tablet Take 1 tablet (4 mg total) by mouth every 8 (eight) hours as needed for nausea or vomiting. 10 tablet 0   pantoprazole (PROTONIX) 40 MG tablet Take 1 tablet (40 mg total) by mouth daily. 30 tablet 1   vortioxetine HBr (TRINTELLIX) 10 MG TABS tablet Take 1 tablet (10 mg  total) by mouth daily. 90 tablet 1   zolpidem (AMBIEN) 10 MG tablet Take 1 tablet (10 mg total) by mouth at bedtime as needed for sleep. 30 tablet 0   No current facility-administered medications for this visit.     Psychiatric Specialty Exam: Review of Systems  not currently breastfeeding.There is no height or weight on file to calculate BMI.  General Appearance: Fairly Groomed  Eye Contact:  Good  Speech:  Clear and Coherent and Pressured  Volume:  Normal  Mood:  Anxious and Euthymic  Affect:  Congruent  Thought Process:  Coherent and Goal Directed  Orientation:  Full (Time, Place, and Person)  Thought Content: Logical   Suicidal Thoughts:  No  Homicidal Thoughts:  No  Memory:  Immediate;   Good  Judgement:  Good  Insight:  Fair  Psychomotor Activity:  Normal  Concentration:  Concentration: Good  Recall:  Good  Fund of Knowledge: Fair  Language: Good  Akathisia:  NA    AIMS (if indicated): not done  Assets:  Communication Skills Desire for Improvement Housing Talents/Skills Transportation  ADL's:  Intact  Cognition: WNL  Sleep:  Fair   Metabolic Disorder Labs: Lab Results  Component Value Date   HGBA1C 5.4 03/30/2022   No results found for: "PROLACTIN" No results found for: "CHOL", "TRIG", "HDL", "CHOLHDL", "VLDL", "LDLCALC" Lab Results  Component Value Date   TSH 1.34 03/30/2022    Therapeutic Level Labs: No results found for: "LITHIUM" No results found for: "VALPROATE" No results found for: "CBMZ"   Screenings: GAD-7    Flowsheet Row Video Visit [facility]01/20[facility] Video Visit from 09/30/2022 in [facility] [employer] from 08/25/2022 in [facility] [employer] Visit from 12/28/2021 in [facility]  Total GAD-7 Score 20 3 21 20       PHQ2-9    Flowsheet Row Video Visit from 12/01/2022 in [facility] Office Visit from 08/25/2022 in [facility] [place] Office Visit from 12/28/2021 in [facility] Nutrition from 04/14/2021 in [facility] Total Score 1 6 0 0  PHQ-9 Total Score 11 18 6  --      Flowsheet Row Video Visit from 12/01/2022 in [facility] ED from 10/18/2022 in [facility] at [facility] ED from 07/21/2022 in [facility]  C-SSRS RISK CATEGORY No Risk No Risk No Risk       Collaboration of Care: Collaboration of Care: Medication Management AEB medication prescription  Patient/Guardian was advised Release of Information must be obtained prior to any record release in order to collaborate their care with an outside provider. Patient/Guardian was advised if they have not already done so to contact the registration department to sign all necessary forms in order for [place] to release information regarding their care.   Consent: Patient/Guardian gives verbal consent for treatment and assignment of benefits for services provided during this visit. Patient/Guardian expressed understanding and agreed to proceed.    Stasia Cavalier, MD 01/12/2023, 9:44 AM   Virtual Visit via Video Note  I connected with Morgan Chang on 01/12/23 at  9:30 AM EDT by a video enabled telemedicine application and verified that I am speaking with the correct person using two identifiers.  Location: Patient: Home Provider: Home Office   I discussed the limitations of evaluation and management by telemedicine and the availability of in person appointments. The patient expressed understanding and agreed to proceed.   I discussed the assessment and treatment plan with the patient. The patient was provided an opportunity to ask questions and all were answered. The patient agreed with the plan and  demonstrated an understanding of the instructions.   The patient was advised to call back or seek an in-person evaluation if the symptoms worsen or if the condition fails to improve as anticipated.  I provided 10 minutes of non-face-to-face time during this encounter.   Stasia Cavalier, MD

## 2023-01-13 ENCOUNTER — Other Ambulatory Visit (HOSPITAL_COMMUNITY): Payer: Self-pay

## 2023-01-17 ENCOUNTER — Other Ambulatory Visit (HOSPITAL_COMMUNITY): Payer: Self-pay

## 2023-01-23 ENCOUNTER — Other Ambulatory Visit: Payer: Self-pay | Admitting: Physician Assistant

## 2023-01-23 ENCOUNTER — Other Ambulatory Visit (HOSPITAL_COMMUNITY): Payer: Self-pay

## 2023-01-23 MED ORDER — ZOLPIDEM TARTRATE 10 MG PO TABS
10.0000 mg | ORAL_TABLET | Freq: Every evening | ORAL | 0 refills | Status: DC | PRN
  Filled 2023-01-23 (×2): qty 30, 30d supply, fill #0

## 2023-01-23 NOTE — Telephone Encounter (Signed)
Pt requesting refill for Ambien, Last OV 09/2022.

## 2023-01-24 ENCOUNTER — Other Ambulatory Visit (HOSPITAL_COMMUNITY): Payer: Self-pay

## 2023-01-24 ENCOUNTER — Other Ambulatory Visit: Payer: Self-pay

## 2023-02-06 ENCOUNTER — Encounter: Payer: Self-pay | Admitting: Orthopaedic Surgery

## 2023-02-07 ENCOUNTER — Other Ambulatory Visit: Payer: Self-pay

## 2023-02-07 DIAGNOSIS — M542 Cervicalgia: Secondary | ICD-10-CM

## 2023-02-07 DIAGNOSIS — M545 Low back pain, unspecified: Secondary | ICD-10-CM

## 2023-02-17 ENCOUNTER — Other Ambulatory Visit: Payer: Self-pay | Admitting: Physician Assistant

## 2023-02-17 MED ORDER — ZOLPIDEM TARTRATE 10 MG PO TABS
10.0000 mg | ORAL_TABLET | Freq: Every evening | ORAL | 0 refills | Status: DC | PRN
  Filled 2023-02-17 – 2023-02-20 (×2): qty 30, 30d supply, fill #0

## 2023-02-17 MED ORDER — LISDEXAMFETAMINE DIMESYLATE 70 MG PO CAPS
70.0000 mg | ORAL_CAPSULE | Freq: Every day | ORAL | 0 refills | Status: DC
  Filled 2023-02-17 – 2023-02-20 (×2): qty 30, 30d supply, fill #0

## 2023-02-17 NOTE — Telephone Encounter (Signed)
Pt requesting refill for Vyvanse 70 mg, Ambien 10 mg. Last OV was virtual visit 09/30/2022.

## 2023-02-20 ENCOUNTER — Other Ambulatory Visit (HOSPITAL_COMMUNITY): Payer: Self-pay

## 2023-02-20 ENCOUNTER — Other Ambulatory Visit: Payer: Self-pay

## 2023-02-23 ENCOUNTER — Encounter (HOSPITAL_COMMUNITY): Payer: Self-pay | Admitting: Psychiatry

## 2023-02-23 ENCOUNTER — Telehealth (HOSPITAL_BASED_OUTPATIENT_CLINIC_OR_DEPARTMENT_OTHER): Admitting: Psychiatry

## 2023-02-23 ENCOUNTER — Other Ambulatory Visit (HOSPITAL_COMMUNITY): Payer: Self-pay | Admitting: *Deleted

## 2023-02-23 DIAGNOSIS — F410 Panic disorder [episodic paroxysmal anxiety] without agoraphobia: Secondary | ICD-10-CM

## 2023-02-23 DIAGNOSIS — F411 Generalized anxiety disorder: Secondary | ICD-10-CM

## 2023-02-23 DIAGNOSIS — F430 Acute stress reaction: Secondary | ICD-10-CM

## 2023-02-23 MED ORDER — LORAZEPAM 0.5 MG PO TABS: 0.5000 mg | ORAL_TABLET | Freq: Every day | ORAL | 0 refills | Status: DC | PRN

## 2023-02-23 MED ORDER — VORTIOXETINE HBR 20 MG PO TABS: 20.0000 mg | ORAL_TABLET | Freq: Every day | ORAL | 0 refills | Status: DC

## 2023-02-23 MED ORDER — VORTIOXETINE HBR 20 MG PO TABS: 20.0000 mg | ORAL_TABLET | Freq: Every day | ORAL | 2 refills | Status: DC

## 2023-02-23 NOTE — Progress Notes (Signed)
BH MD/PA/NP OP Progress Note  02/23/2023 8:53 AM Morgan Chang  MRN:  [id number]  Visit Diagnosis:    ICD-10-CM   1. Generalized anxiety disorder  F41.1 vortioxetine HBr (TRINTELLIX) 20 MG TABS tablet    LORazepam (ATIVAN) 0.5 MG tablet    2. Panic attack as reaction to stress  F41.0 LORazepam (ATIVAN) 0.5 MG tablet   F43.0       Assessment: Morgan Chang is a 27 y.o. female with a history of MDD, sleep difficulties and reported ADHD who presented to [facility] at [facility] for initial evaluation on 12/02/2022.  During initial evaluation patient reported significant symptoms of anxiety including excessive worry control, difficulty relaxing, racing thoughts worse at night that affects sleep, restlessness, increased irritability, and fears about happening.  She also endorsed experiencing panic attacks which started in December 2023 and occur once every couple days.  During the episodes of panic patient reportd symptoms of diaphoresis, increased restlessness, picking behaviors, and dissociations.  Patient also does note some depressive symptoms, that appear to be a bit better managed after starting the Trintellix.  She denies any SI or thoughts of self-harm along with any history of mania or psychosis. Patient met criteria for generalized anxiety disorder and panic attacks.  Taunja Waddles presents for follow-up evaluation. Today, 02/23/23, patient reports some continued improvement in her anxiety and panic with the increase in Trintellix.  There are still days where the anxiety symptoms are worse and she does experience panic attacks 4-5 times a week.  Patient notes that this is a significant improvement compared to the past however.  She denies any adverse side effects from Trintellix and is open to continuing to titrate the dose to 20 mg daily.  As for the gabapentin patient has found it to be less effective than the increased dose has been giving her headache after she  takes it.  We will discontinue gabapentin and start Ativan 0.5 mg daily as needed for panic.  Patient will also be referred for therapy and was provided with resources.  We will follow-up in a month.  Plan: - Increase Trintellix 20 mg  - Start Ativan 0.5 mg QS - Discontinue gabapentin - Continue Vyvanse 70 mg QD managed by her PCP - Continue Ambien 10 mg managed by PCP - Vit D ordered - CMP, CBC, TSH, reviewed - Crisis resources reviewed - Follow up in a month   Chief Complaint:  Chief Complaint  Patient presents with   Follow-up   HPI: Morgan Chang presents reporting that things have been fairly good over the past month and a half.  She has felt the Trintellix kicking in the 10 mg dose and has noticed an overall decrease in her anxiety levels.  She does still have some good days and bad days of good days are more frequent.  On a bad day she has chest pain and a feeling of dread though notes that it is more manageable than it had been in the past.  Morgan Chang does endorse still experiencing panic attacks around 4 to 5 days a week but notes that this is a major improvement compared to the past.  While the frequency has decreased the severity of panic attacks is fairly consistent.  She has been taking gabapentin but denies any improvement in the panic attacks when she uses it and instead starts to experience headaches shortly after taking the medication.  We discussed alternative options for the panic and patient has already tried hydroxyzine, propranolol, and BuSpar  in the past with no benefit.  She had tried Xanax which she found effective.  Ultimately patient was agreeable to continuing to titrate Trintellix for better overall coverage of anxiety and panic symptoms.  We also will start Ativan 0.5 mg to be used on an as needed basis for breakthrough panic attacks.  Risk and benefits of this medication were reviewed.  Therapy options were also explored with the patient and she was open to this.  She had  been connected with therapy in the past however first 1 was not a good fit and the second 1 stopped taking her insurance.  Past Psychiatric History: The patient denies any prior psychiatric hospitalizations or suicide attempts.  She has been connected with a couple therapists in the past, though is currently not seeing anyone after her last therapist left the practice in January.   She has tried mirtazapine, Wellbutrin, Prozac, Zoloft, Lexapro (headaches), Celexa, BuSpar, propranolol (hair fall out), Atarax (sleepy), Seroquel, trazodone, Depakote, Adderall, Ritalin, Klonopin, Xanax, Lunesta  Patient currently taking Vyvanse, Trintellix, Ambien, and gabapentin.  She denies any substance use denies other than one alcoholic drink a week.   Past Medical History:  Past Medical History:  Diagnosis Date   Anemia 09/14/2021   Anxiety    Depression    Endometriosis    Fibroid    Frank breech presentation 10/29/2016   Gestational diabetes    Migraines    Pregnancy induced hypertension    S/P cesarean section 10/29/2016    Past Surgical History:  Procedure Laterality Date   ADENOIDECTOMY  10/29/2007   BREAST REDUCTION SURGERY     CESAREAN SECTION N/A 10/29/2016   Procedure: CESAREAN SECTION;  Surgeon: Sherian Rein, MD;  Location: [facility];  Service: Obstetrics;  Laterality: N/A;   CESAREAN SECTION N/A 07/25/2021   Procedure: Repeat CESAREAN SECTION;  Surgeon: Noland Fordyce, MD;  Location: [facility];  Service: Obstetrics;  Laterality: N/A;   Repeat C/S BTL   LAPAROSCOPY  12/10/2014   NISSEN FUNDOPLICATION     RHINOPLASTY  03/22/2010   deviated septum   TONSILLECTOMY  2017   TUBAL LIGATION  07/25/2021    Family History:  Family History  Problem Relation Age of Onset   Heart disease Mother    Hypertension Mother    Anxiety disorder Mother    Depression Mother    Hypertension Father    Anxiety disorder Father    Depression Father    Testicular cancer Brother      Social History:  Social History   Socioeconomic History   Marital status: Married    Spouse name: Not on file   Number of children: Not on file   Years of education: Not on file   Highest education level: Not on file  Occupational History   Occupation: Homemaker  Tobacco Use   Smoking status: Never   Smokeless tobacco: Never  Vaping Use   Vaping Use: Never used  Substance and Sexual Activity   Alcohol use: No   Drug use: No   Sexual activity: Yes    Birth control/protection: None  Other Topics Concern   Not on file  Social History Narrative   Pre-school teacher   Social Determinants of Health   Financial Resource Strain: Not on file  Food Insecurity: Food Insecurity Present (04/14/2021)   Hunger Vital Sign    Worried About Running Out of Food in the Last Year: Never true    Ran Out of Food in the Last Year:  Sometimes true  Transportation Needs: Not on file  Physical Activity: Not on file  Stress: Not on file  Social Connections: Not on file    Allergies: No Known Allergies  Current Medications: Current Outpatient Medications  Medication Sig Dispense Refill   LORazepam (ATIVAN) 0.5 MG tablet Take 1 tablet (0.5 mg total) by mouth daily as needed for anxiety. 30 tablet 0   cephALEXin (KEFLEX) 500 MG capsule 2 caps po bid x 7 days 28 capsule 0   desonide (DESOWEN) 0.05 % cream Appy to affected area 1-2 (two) times daily. 30 g 0   gabapentin (NEURONTIN) 100 MG capsule Take 2 capsules (200 mg total) by mouth 3 (three) times daily as needed. 180 capsule 1   lisdexamfetamine (VYVANSE) 70 MG capsule Take 1 capsule (70 mg total) by mouth daily. 30 capsule 0   methocarbamol (ROBAXIN-750) 750 MG tablet Take 1 tablet (750 mg total) by mouth 2 (two) times daily as needed for muscle spasms. 20 tablet 2   naproxen (NAPROSYN) 500 MG tablet as needed for mild pain.     ondansetron (ZOFRAN) 4 MG tablet Take 1 tablet (4 mg total) by mouth every 8 (eight) hours as needed for nausea  or vomiting. 10 tablet 0   pantoprazole (PROTONIX) 40 MG tablet Take 1 tablet (40 mg total) by mouth daily. 30 tablet 1   vortioxetine HBr (TRINTELLIX) 20 MG TABS tablet Take 1 tablet (20 mg total) by mouth daily. 30 tablet 2   zolpidem (AMBIEN) 10 MG tablet Take 1 tablet (10 mg total) by mouth at bedtime as needed for sleep. 30 tablet 0   No current facility-administered medications for this visit.     Psychiatric Specialty Exam: Review of Systems  not currently breastfeeding.There is no height or weight on file to calculate BMI.  General Appearance: Fairly Groomed  Eye Contact:  Good  Speech:  Clear and Coherent and Pressured  Volume:  Normal  Mood:  Anxious and Euthymic  Affect:  Congruent  Thought Process:  Coherent and Goal Directed  Orientation:  Full (Time, Place, and Person)  Thought Content: Logical   Suicidal Thoughts:  No  Homicidal Thoughts:  No  Memory:  Immediate;   Good  Judgement:  Good  Insight:  Fair  Psychomotor Activity:  Normal  Concentration:  Concentration: Good  Recall:  Good  Fund of Knowledge: Fair  Language: Good  Akathisia:  NA    AIMS (if indicated): not done  Assets:  Communication Skills Desire for Improvement Housing Talents/Skills Transportation  ADL's:  Intact  Cognition: WNL  Sleep:  Fair   Metabolic Disorder Labs: Lab Results  Component Value Date   HGBA1C 5.4 03/30/2022   No results found for: "PROLACTIN" No results found for: "CHOL", "TRIG", "HDL", "CHOLHDL", "VLDL", "LDLCALC" Lab Results  Component Value Date   TSH 1.34 03/30/2022    Therapeutic Level Labs: No results found for: "LITHIUM" No results found for: "VALPROATE" No results found for: "CBMZ"   Screenings: GAD-7    Flowsheet Row Video Visit from [facility]20[facility] Video Visit from 09/30/2022 in [facility] [employer] [facility] from 08/25/2022 in [facility] [employer] [facility] Visit from  12/28/2021 in [facility]  Total GAD-7 Score 20 3 21 20       PHQ2-9    Flowsheet Row Video Visit from 12/01/2022 in [facility] Office Visit from 08/25/2022 in [facility] from 12/28/2021 in  [facility] from 04/14/2021 in [facility] Total Score 1 6 0 0  PHQ-9 Total Score 11 18 6  --      Flowsheet Row Video Visit from 12/01/2022 [facility] ED from 10/18/2022 in [facility] ED from 07/21/2022 in [facility])  C-SSRS RISK CATEGORY No Risk No Risk No Risk       Collaboration of Care: Collaboration of Care: Medication Management AEB medication prescription and Referral or follow-up with counselor/therapist AEB therapy referral  Patient/Guardian was advised Release of Information must be obtained prior to any record release in order to collaborate their care with an outside provider. Patient/Guardian was advised if they have not already done so to contact the registration department to sign all necessary forms in order for [place] to release information regarding their care.   Consent: Patient/Guardian gives verbal consent for treatment and assignment of benefits for services provided during this visit. Patient/Guardian expressed understanding and agreed to proceed.    Stasia Cavalier, MD 02/23/2023, 8:53 AM   Virtual Visit via Video Note  I connected with [name] on 02/23/23 at  8:30 AM EDT by a video enabled telemedicine application and verified that I am speaking with the correct person using two identifiers.  Location: Patient: Home Provider: Home Office   I discussed the limitations of evaluation and management by telemedicine and the availability of in person  appointments. The patient expressed understanding and agreed to proceed.   I discussed the assessment and treatment plan with the patient. The patient was provided an opportunity to ask questions and all were answered. The patient agreed with the plan and demonstrated an understanding of the instructions.   The patient was advised to call back or seek an in-person evaluation if the symptoms worsen or if the condition fails to improve as anticipated.  I provided 20 minutes of non-face-to-face time during this encounter.   Stasia Cavalier, MD

## 2023-02-23 NOTE — Addendum Note (Signed)
Addended by: Everlena Cooper on: 02/23/2023 09:58 AM   Modules accepted: Orders

## 2023-02-23 NOTE — Addendum Note (Signed)
Addended by: Everlena Cooper on: 02/23/2023 10:32 AM   Modules accepted: Orders

## 2023-02-25 ENCOUNTER — Ambulatory Visit
Admission: EM | Admit: 2023-02-25 | Discharge: 2023-02-25 | Disposition: A | Discharge status: Home / Self Care | Attending: Urgent Care | Admitting: Urgent Care

## 2023-02-25 ENCOUNTER — Ambulatory Visit

## 2023-02-25 DIAGNOSIS — R102 Pelvic and perineal pain: Secondary | ICD-10-CM | POA: Insufficient documentation

## 2023-02-25 DIAGNOSIS — N3001 Acute cystitis with hematuria: Secondary | ICD-10-CM | POA: Diagnosis present

## 2023-02-25 DIAGNOSIS — N760 Acute vaginitis: Secondary | ICD-10-CM | POA: Diagnosis not present

## 2023-02-25 LAB — POCT URINALYSIS DIP (MANUAL ENTRY)
Bilirubin, UA: NEGATIVE
Glucose, UA: NEGATIVE mg/dL
Ketones, POC UA: NEGATIVE mg/dL
Nitrite, UA: NEGATIVE
Protein Ur, POC: NEGATIVE mg/dL
Spec Grav, UA: 1.025 (ref 1.010–1.025)
Urobilinogen, UA: 0.2 E.U./dL
pH, UA: 7 (ref 5.0–8.0)

## 2023-02-25 MED ORDER — FLUCONAZOLE 150 MG PO TABS: 150.0000 mg | ORAL_TABLET | ORAL | 0 refills | Status: DC

## 2023-02-25 MED ORDER — NITROFURANTOIN MONOHYD MACRO 100 MG PO CAPS: 100.0000 mg | ORAL_CAPSULE | Freq: Two times a day (BID) | ORAL | 0 refills | Status: DC

## 2023-02-25 NOTE — Discharge Instructions (Addendum)
Please start Macrobid to address an urinary tract infection and fluconazole for yeast infection. Make sure you hydrate very well with plain water and a quantity of 80 ounces of water a day.  Please limit drinks that are considered urinary irritants such as soda, sweet tea, coffee, energy drinks, alcohol.  These can worsen your urinary and genital symptoms but also be the source of them.  I will let you know about your urine culture results through MyChart to see if we need to prescribe or change your antibiotics based off of those results.

## 2023-02-25 NOTE — ED Provider Notes (Signed)
[name]  Note:  This document was prepared using Conservation officer, historic buildings and may include unintentional dictation errors.  MRN: [id number] DOB: Mar 22, 1996  Subjective:   Morgan Chang is a 27 y.o. female presenting for 1 week history of urinary frequency, urinary urgency. Was hydrating well and improved her symptoms but now has pelvic pain, low back pain. No concern for pregnancy or STI. She has had some light vaginal discharge and itching.  However, she is not opposed to testing.  No current facility-administered medications for this encounter.  Current Outpatient Medications:    cephALEXin (KEFLEX) 500 MG capsule, 2 caps po bid x 7 days, Disp: 28 capsule, Rfl: 0   desonide (DESOWEN) 0.05 % cream, Appy to affected area 1-2 (two) times daily., Disp: 30 g, Rfl: 0   gabapentin (NEURONTIN) 100 MG capsule, Take 2 capsules (200 mg total) by mouth 3 (three) times daily as needed., Disp: 180 capsule, Rfl: 1   lisdexamfetamine (VYVANSE) 70 MG capsule, Take 1 capsule (70 mg total) by mouth daily., Disp: 30 capsule, Rfl: 0   LORazepam (ATIVAN) 0.5 MG tablet, Take 1 tablet (0.5 mg total) by mouth daily as needed for anxiety., Disp: 30 tablet, Rfl: 0   methocarbamol (ROBAXIN-750) 750 MG tablet, Take 1 tablet (750 mg total) by mouth 2 (two) times daily as needed for muscle spasms., Disp: 20 tablet, Rfl: 2   naproxen (NAPROSYN) 500 MG tablet, as needed for mild pain., Disp: , Rfl:    ondansetron (ZOFRAN) 4 MG tablet, Take 1 tablet (4 mg total) by mouth every 8 (eight) hours as needed for nausea or vomiting., Disp: 10 tablet, Rfl: 0   pantoprazole (PROTONIX) 40 MG tablet, Take 1 tablet (40 mg total) by mouth daily., Disp: 30 tablet, Rfl: 1   vortioxetine HBr (TRINTELLIX) 20 MG TABS tablet, Take 1 tablet (20 mg total) by mouth daily., Disp: 90 tablet, Rfl: 0   zolpidem (AMBIEN) 10 MG tablet, Take 1 tablet (10 mg total) by mouth at bedtime as needed for sleep., Disp: 30  tablet, Rfl: 0   No Known Allergies  Past Medical History:  Diagnosis Date   Anemia 09/14/2021   Anxiety    Depression    Endometriosis    Fibroid    Frank breech presentation 10/29/2016   Gestational diabetes    Migraines    Pregnancy induced hypertension    S/P cesarean section 10/29/2016     Past Surgical History:  Procedure Laterality Date   ADENOIDECTOMY  10/29/2007   BREAST REDUCTION SURGERY     CESAREAN SECTION N/A 10/29/2016   Procedure: CESAREAN SECTION;  Surgeon: Sherian Rein, MD;  [facility]: [facility];  Service: Obstetrics;  Laterality: N/A;   CESAREAN SECTION N/A 07/25/2021   Procedure: Repeat CESAREAN SECTION;  Surgeon: Noland Fordyce, MD;  Location: [facility];  Service: Obstetrics;  Laterality: N/A;   Repeat C/S BTL   LAPAROSCOPY  12/10/2014   NISSEN FUNDOPLICATION     RHINOPLASTY  03/22/2010   deviated septum   TONSILLECTOMY  2017   TUBAL LIGATION  07/25/2021    Family History  Problem Relation Age of Onset   Heart disease Mother    Hypertension Mother    Anxiety disorder Mother    Depression Mother    Hypertension Father    Anxiety disorder Father    Depression Father    Testicular cancer Brother     Social History   Tobacco Use   Smoking status: Never  Smokeless tobacco: Never  Vaping Use   Vaping Use: Never used  Substance Use Topics   Alcohol use: No   Drug use: No    ROS   Objective:   Vitals: BP (!) 128/92   Pulse 90   Temp 99.1 F (37.3 C) (Oral)   Resp 18   SpO2 97%   Physical Exam Constitutional:      General: She is not in acute distress.    Appearance: Normal appearance. She is well-developed. She is not ill-appearing, toxic-appearing or diaphoretic.  HENT:     Head: Normocephalic and atraumatic.     Nose: Nose normal.     Mouth/Throat:     Mouth: Mucous membranes are moist.  Eyes:     General: No scleral icterus.       Right eye: No discharge.        Left eye: No discharge.     Extraocular  Movements: Extraocular movements intact.     Conjunctiva/sclera: Conjunctivae normal.  Cardiovascular:     Rate and Rhythm: Normal rate.  Pulmonary:     Effort: Pulmonary effort is normal.  Abdominal:     General: Bowel sounds are normal. There is no distension.     Palpations: Abdomen is soft. There is no mass.     Tenderness: There is no abdominal tenderness. There is no right CVA tenderness, left CVA tenderness, guarding or rebound.  Musculoskeletal:     Lumbar back: Tenderness (over area outlined) present. No swelling, edema, deformity, signs of trauma, lacerations, spasms or bony tenderness. Normal range of motion. Negative right straight leg raise test and negative left straight leg raise test. No scoliosis.       Back:  Skin:    General: Skin is warm and dry.  Neurological:     General: No focal deficit present.     Mental Status: She is alert and oriented to person, place, and time.  Psychiatric:        Mood and Affect: Mood normal.        Behavior: Behavior normal.        Thought Content: Thought content normal.        Judgment: Judgment normal.     Results for orders placed or performed during the hospital encounter of 02/25/23 (from the past 24 hour(s))  POCT urinalysis dipstick     Status: Abnormal   Collection Time: 02/25/23 11:50 AM  Result Value Ref Range   Color, UA yellow yellow   Clarity, UA cloudy (A) clear   Glucose, UA negative negative mg/dL   Bilirubin, UA negative negative   Ketones, POC UA negative negative mg/dL   Spec Grav, UA 1.610 9.604 - 1.025   Blood, UA trace-intact (A) negative   pH, UA 7.0 5.0 - 8.0   Protein Ur, POC negative negative mg/dL   Urobilinogen, UA 0.2 0.2 or 1.0 E.U./dL   Nitrite, UA Negative Negative   Leukocytes, UA Large (3+) (A) Negative    Assessment and Plan :   PDMP not reviewed this encounter.  1. Acute cystitis with hematuria   2. Acute vaginitis   3. Suprapubic pain    Will defer treatment for acute  pyelonephritis given lack of costovertebral angle tenderness.  Start Macrobid to cover for acute cystitis, urine culture pending.  Recommended aggressive hydration, limiting urinary irritants.  Prefers to avoid cephalexin as this antibiotic gives her a bad yeast infection.  Would also like coverage for yeast vaginitis, I will provide this to  her.  Labs pending.  Counseled patient on potential for adverse effects with medications prescribed/recommended today, ER and return-to-clinic precautions discussed, patient verbalized understanding.    Wallis Bamberg, [place] 02/25/23 1238

## 2023-02-25 NOTE — ED Triage Notes (Signed)
Pt c/o dysuria, lower back pain x 1 week-NAD-steady gait

## 2023-02-26 LAB — URINE CULTURE

## 2023-02-27 LAB — CERVICOVAGINAL ANCILLARY ONLY
Bacterial Vaginitis (gardnerella): POSITIVE — AB
Candida Glabrata: NEGATIVE
Candida Vaginitis: POSITIVE — AB
Chlamydia: NEGATIVE
Comment: NEGATIVE
Comment: NEGATIVE
Comment: NEGATIVE
Comment: NEGATIVE
Comment: NEGATIVE
Comment: NORMAL
Neisseria Gonorrhea: NEGATIVE
Trichomonas: NEGATIVE

## 2023-02-27 LAB — URINE CULTURE: Culture: 80000 — AB

## 2023-02-28 ENCOUNTER — Telehealth (HOSPITAL_COMMUNITY): Payer: Self-pay | Admitting: Emergency Medicine

## 2023-02-28 MED ORDER — METRONIDAZOLE 500 MG PO TABS: 500.0000 mg | ORAL_TABLET | Freq: Two times a day (BID) | ORAL | 0 refills | Status: DC

## 2023-03-06 ENCOUNTER — Ambulatory Visit (HOSPITAL_BASED_OUTPATIENT_CLINIC_OR_DEPARTMENT_OTHER)

## 2023-03-06 ENCOUNTER — Other Ambulatory Visit: Payer: Self-pay

## 2023-03-06 ENCOUNTER — Other Ambulatory Visit (HOSPITAL_COMMUNITY): Payer: Self-pay | Admitting: Psychiatry

## 2023-03-06 ENCOUNTER — Encounter (HOSPITAL_COMMUNITY): Payer: Self-pay

## 2023-03-06 VITALS — BP 124/70 | HR 82 | Ht 66.0 in | Wt 169.8 lb

## 2023-03-06 DIAGNOSIS — F331 Major depressive disorder, recurrent, moderate: Secondary | ICD-10-CM

## 2023-03-06 NOTE — Progress Notes (Signed)
Patient came in today to get her Vitamin D level checked. I confirmed with Dr. Mercy Riding that this was all he needed and he gave verbal confirmation. Patient tolerated well and without complaint.

## 2023-03-07 ENCOUNTER — Telehealth (HOSPITAL_COMMUNITY): Payer: Self-pay

## 2023-03-07 DIAGNOSIS — F411 Generalized anxiety disorder: Secondary | ICD-10-CM

## 2023-03-07 DIAGNOSIS — F410 Panic disorder [episodic paroxysmal anxiety] without agoraphobia: Secondary | ICD-10-CM

## 2023-03-07 MED ORDER — CLONAZEPAM 0.5 MG PO TABS
0.5000 mg | ORAL_TABLET | Freq: Every day | ORAL | 0 refills | Status: DC | PRN
Start: 2023-03-07 — End: 2023-03-30

## 2023-03-07 NOTE — Telephone Encounter (Signed)
Patient is calling to advise that the medication you gave her for anxiety is working some but not enough. She said you mentioned possibly adding Klonopin and wants to know if you can still do that? Please review and advise, thank you

## 2023-03-08 ENCOUNTER — Telehealth (HOSPITAL_COMMUNITY): Payer: Self-pay | Admitting: *Deleted

## 2023-03-08 LAB — VITAMIN D 25 HYDROXY (VIT D DEFICIENCY, FRACTURES): Vit D, 25-Hydroxy: 30.4 ng/mL (ref 30.0–100.0)

## 2023-03-08 NOTE — Telephone Encounter (Signed)
Vitamin D, 25-Hydroxy, results received from [facility].   Vitamin D, 25-Hydroxy level 30.4

## 2023-03-14 ENCOUNTER — Encounter: Admitting: Physician Assistant

## 2023-03-16 ENCOUNTER — Other Ambulatory Visit: Payer: Self-pay | Admitting: Physician Assistant

## 2023-03-17 ENCOUNTER — Other Ambulatory Visit (HOSPITAL_COMMUNITY): Payer: Self-pay

## 2023-03-17 MED ORDER — ZOLPIDEM TARTRATE 10 MG PO TABS
10.0000 mg | ORAL_TABLET | Freq: Every evening | ORAL | 0 refills | Status: DC | PRN
  Filled 2023-03-17 – 2023-03-20 (×2): qty 30, 30d supply, fill #0

## 2023-03-17 MED ORDER — LISDEXAMFETAMINE DIMESYLATE 70 MG PO CAPS
70.0000 mg | ORAL_CAPSULE | Freq: Every day | ORAL | 0 refills | Status: DC
  Filled 2023-03-17 – 2023-03-20 (×2): qty 30, 30d supply, fill #0

## 2023-03-17 NOTE — Telephone Encounter (Signed)
Pt requesting refills for Vyvanse 70 mg & Zolpidem 10 mg. Pt scheduled to see Samantha on 04/20/2023.

## 2023-03-20 ENCOUNTER — Other Ambulatory Visit: Payer: Self-pay

## 2023-03-20 ENCOUNTER — Other Ambulatory Visit (HOSPITAL_COMMUNITY): Payer: Self-pay

## 2023-03-30 ENCOUNTER — Telehealth (HOSPITAL_BASED_OUTPATIENT_CLINIC_OR_DEPARTMENT_OTHER): Admitting: Psychiatry

## 2023-03-30 ENCOUNTER — Encounter (HOSPITAL_COMMUNITY): Payer: Self-pay

## 2023-03-30 DIAGNOSIS — F411 Generalized anxiety disorder: Secondary | ICD-10-CM | POA: Diagnosis not present

## 2023-03-30 DIAGNOSIS — F410 Panic disorder [episodic paroxysmal anxiety] without agoraphobia: Secondary | ICD-10-CM

## 2023-03-30 DIAGNOSIS — F331 Major depressive disorder, recurrent, moderate: Secondary | ICD-10-CM

## 2023-03-30 DIAGNOSIS — F430 Acute stress reaction: Secondary | ICD-10-CM

## 2023-03-30 MED ORDER — CLONAZEPAM 0.5 MG PO TABS
0.5000 mg | ORAL_TABLET | Freq: Every day | ORAL | 0 refills | Status: DC | PRN
Start: 2023-03-30 — End: 2023-05-08

## 2023-03-30 NOTE — Progress Notes (Signed)
BH MD/PA/NP OP Progress Note  03/30/2023 3:25 PM Romelle Muldoon  MRN:  [id number]  Visit Diagnosis:    ICD-10-CM   1. Generalized anxiety disorder  F41.1 clonazePAM (KLONOPIN) 0.5 MG tablet    2. Panic attack as reaction to stress  F41.0 clonazePAM (KLONOPIN) 0.5 MG tablet   F43.0     3. Moderate episode of recurrent major depressive disorder (HCC) [F33.1]  F33.1        Assessment: Traci Gafford is a 27 y.o. female with a history of MDD, sleep difficulties and reported ADHD who presented to [facility] at [facility] for initial evaluation on 12/02/2022.  During initial evaluation patient reported significant symptoms of anxiety including excessive worry control, difficulty relaxing, racing thoughts worse at night that affects sleep, restlessness, increased irritability, and fears about happening.  She also endorsed experiencing panic attacks which started in December 2023 and occur once every couple days.  During the episodes of panic patient reportd symptoms of diaphoresis, increased restlessness, picking behaviors, and dissociations.  Patient also does note some depressive symptoms, that appear to be a bit better managed after starting the Trintellix.  She denies any SI or thoughts of self-harm along with any history of mania or psychosis. Patient met criteria for generalized anxiety disorder and panic attacks.  Darletta Noblett presents for follow-up evaluation. Today, 03/30/23, patient had reached out in the interim reporting that Ativan was helpful was not lasting longer.  Ativan was discontinued and Klonopin was started at that time.  Patient has tolerated Klonopin well and denies any adverse side effects.  Panic symptoms have been improving the decrease to experiencing panic episodes every 2 to 3 days.  Patient also started Trintellix on 7/1 and denies any adverse side effects other than some potential slight decrease in libido since this increased.  We will continue on  her current regimen and follow-up in a month.  Plan: - Increase Trintellix 20 mg  - Discontinue Ativan 0.5 mg every day - Start Klonopin 0.5 mg every day  - Discontinue gabapentin - Continue Vyvanse 70 mg QD managed by her PCP - Continue Ambien 10 mg managed by PCP - CMP, CBC, TSH, Vit D reviewed - Crisis resources reviewed - Follow up in a month   Chief Complaint:  Chief Complaint  Patient presents with   Follow-up   HPI: Jelitza had reached out and was contacted in the interim reporting that the lorazepam had worked but only lasted a couple hours for wearing off resulting in her panic attacks remain present after the medication wore off.  She had been using the Ativan every day with benefit but continue symptoms.  Patient has been interested in changing to a longer acting medication and we agreed to change to Klonopin at that time.  Today patient presents reporting that she has been doing quite well over the last few weeks.  Initially after changing the Ativan to Klonopin she was using it every day for control of her anxiety symptoms however the panic attacks began to become better controlled and less severe resulting in the current every 2 to 3 days instead.  With this Jailin has decreased her Klonopin use to every 2 to 3 days now.  In addition she did receive the Trintellix increase in the mail on July 1 and started it the next day.  She denies any adverse side effects since starting the medication other than some potential slight decrease in libido.  She will continue to monitor this to  see if it becomes more severe or disruptive in her daily life.  She is unaware of any benefit from the medication yet though is aware that it can take up to 6 weeks for to take was full effect.  Past Psychiatric History: The patient denies any prior psychiatric hospitalizations or suicide attempts.  She has been connected with a couple therapists in the past, though is currently not seeing anyone after  her last therapist left the practice in January.   She has tried mirtazapine, Wellbutrin, Prozac, Zoloft, Lexapro (headaches), Celexa, BuSpar, propranolol (hair fall out), Atarax (sleepy), Seroquel, trazodone, Depakote, Adderall, Ritalin, Klonopin, Xanax, Lunesta  Patient currently taking Vyvanse, Trintellix, Ambien, and gabapentin.  She denies any substance use denies other than one alcoholic drink a week.   Past Medical History:  Past Medical History:  Diagnosis Date   Anemia 09/14/2021   Anxiety    Depression    Endometriosis    Fibroid    Frank breech presentation 10/29/2016   Gestational diabetes    Migraines    Pregnancy induced hypertension    S/P cesarean section 10/29/2016    Past Surgical History:  Procedure Laterality Date   ADENOIDECTOMY  10/29/2007   BREAST REDUCTION SURGERY     CESAREAN SECTION N/A 10/29/2016   Procedure: CESAREAN SECTION;  Surgeon: Sherian Rein, MD;  Location: [facility];  Service: Obstetrics;  Laterality: N/A;   CESAREAN SECTION N/A 07/25/2021   Procedure: Repeat CESAREAN SECTION;  Surgeon: Noland Fordyce, MD;  Location: [facility] LD ORS;  Service: Obstetrics;  Laterality: N/A;   Repeat C/S BTL   LAPAROSCOPY  12/10/2014   NISSEN FUNDOPLICATION     RHINOPLASTY  03/22/2010   deviated septum   TONSILLECTOMY  2017   TUBAL LIGATION  07/25/2021    Family History:  Family History  Problem Relation Age of Onset   Heart disease Mother    Hypertension Mother    Anxiety disorder Mother    Depression Mother    Hypertension Father    Anxiety disorder Father    Depression Father    Testicular cancer Brother     Social History:  Social History   Socioeconomic History   Marital status: Married    Spouse name: Not on file   Number of children: Not on file   Years of education: Not on file   Highest education level: Not on file  Occupational History   Occupation: Homemaker  Tobacco Use   Smoking status: Never   Smokeless tobacco:  Never  Vaping Use   Vaping status: Never Used  Substance and Sexual Activity   Alcohol use: No   Drug use: No   Sexual activity: Yes    Birth control/protection: Surgical  Other Topics Concern   Not on file  Social History Narrative   Pre-school teacher   Social Determinants of Health   Financial Resource Strain: Not on file  Food Insecurity: Food Insecurity Present (04/14/2021)   Hunger Vital Sign    Worried About Running Out of Food in the Last Year: Never true    Ran Out of Food in the Last Year: Sometimes true  Transportation Needs: Not on file  Physical Activity: Not on file  Stress: Not on file  Social Connections: Unknown (02/08/2023)   [facility]   Social Network    Social Network: Not on file    Allergies: No Known Allergies  Current Medications: Current Outpatient Medications  Medication Sig Dispense Refill   clonazePAM (KLONOPIN)  0.5 MG tablet Take 1 tablet (0.5 mg total) by mouth daily as needed for anxiety. 30 tablet 0   desonide (DESOWEN) 0.05 % cream Appy to affected area 1-2 (two) times daily. 30 g 0   fluconazole (DIFLUCAN) 150 MG tablet Take 1 tablet (150 mg total) by mouth once a week. 2 tablet 0   gabapentin (NEURONTIN) 100 MG capsule Take 2 capsules (200 mg total) by mouth 3 (three) times daily as needed. 180 capsule 1   lisdexamfetamine (VYVANSE) 70 MG capsule Take 1 capsule (70 mg total) by mouth daily. 30 capsule 0   methocarbamol (ROBAXIN-750) 750 MG tablet Take 1 tablet (750 mg total) by mouth 2 (two) times daily as needed for muscle spasms. 20 tablet 2   metroNIDAZOLE (FLAGYL) 500 MG tablet Take 1 tablet (500 mg total) by mouth 2 (two) times daily. 14 tablet 0   naproxen (NAPROSYN) 500 MG tablet as needed for mild pain.     nitrofurantoin, macrocrystal-monohydrate, (MACROBID) 100 MG capsule Take 1 capsule (100 mg total) by mouth 2 (two) times daily. 10 capsule 0   ondansetron (ZOFRAN) 4 MG tablet Take 1 tablet (4 mg total) by mouth  every 8 (eight) hours as needed for nausea or vomiting. 10 tablet 0   pantoprazole (PROTONIX) 40 MG tablet Take 1 tablet (40 mg total) by mouth daily. 30 tablet 1   vortioxetine HBr (TRINTELLIX) 20 MG TABS tablet Take 1 tablet (20 mg total) by mouth daily. 90 tablet 0   zolpidem (AMBIEN) 10 MG tablet Take 1 tablet (10 mg total) by mouth at bedtime as needed for sleep. 30 tablet 0   No current facility-administered medications for this visit.     Psychiatric Specialty Exam: Review of Systems  not currently breastfeeding.There is no height or weight on file to calculate BMI.  General Appearance: Fairly Groomed  Eye Contact:  Good  Speech:  Clear and Coherent and Pressured  Volume:  Normal  Mood:  Euthymic  Affect:  Congruent  Thought Process:  Coherent and Goal Directed  Orientation:  Full (Time, Place, and Person)  Thought Content: Logical   Suicidal Thoughts:  No  Homicidal Thoughts:  No  Memory:  Immediate;   Good  Judgement:  Good  Insight:  Fair  Psychomotor Activity:  Normal  Concentration:  Concentration: Good  Recall:  Good  Fund of Knowledge: Fair  Language: Good  Akathisia:  NA    AIMS (if indicated): not done  Assets:  Communication Skills Desire for Improvement Housing Talents/Skills Transportation  ADL's:  Intact  Cognition: WNL  Sleep:  Fair   Metabolic Disorder Labs: Lab Results  Component Value Date   HGBA1C 5.4 03/30/2022   No results found for: "PROLACTIN" No results found for: "CHOL", "TRIG", "HDL", "CHOLHDL", "VLDL", "LDLCALC" Lab Results  Component Value Date   TSH 1.34 03/30/2022    Therapeutic Level Labs: No results found for: "LITHIUM" No results found for: "VALPROATE" No results found for: "CBMZ"   Screenings: GAD-7    Flowsheet Row Video Visit from [facility]/01/2022 in [facility] Video Visit from 09/30/2022 in [facility] from 08/25/2022 in [facility] Visit from 12/28/2021 in [facility]  Total GAD-7 Score 20 3 21 20       PHQ2-9    Flowsheet Row Video Visit from 12/01/2022 in [facility] Office Visit from 08/25/2022 in [facility] from 12/28/2021 in  [facility] Nutrition from 04/14/2021 in [facility] Total Score 1 6 0 0  PHQ-9 Total Score 11 18 6  --      Flowsheet Row ED from 02/25/2023 in [facility] [employer] [facility]) Video Visit from 12/01/2022 in [facility] ED from 10/18/2022 in [facility] Emergency Department at [facility]  C-SSRS RISK CATEGORY No Risk No Risk No Risk       Collaboration of Care: Collaboration of Care: Medication Management AEB medication prescription and Referral or follow-up with counselor/therapist AEB therapy referral  Patient/Guardian was advised Release of Information must be obtained prior to any record release in order to collaborate their care with an outside provider. Patient/Guardian was advised if they have not already done so to contact the registration department to sign all necessary forms in order for [place] to release information regarding their care.   Consent: Patient/Guardian gives verbal consent for treatment and assignment of benefits for services provided during this visit. Patient/Guardian expressed understanding and agreed to proceed.    Stasia Cavalier, MD 03/30/2023, 3:25 PM   Virtual Visit via Video Note  I connected with [name] on 03/30/23 at  3:00 PM EDT by a video enabled telemedicine application and verified that I am speaking with the correct person using two identifiers.  Location: Patient: Home Provider: Home Office   I discussed the limitations of evaluation and management by  telemedicine and the availability of in person appointments. The patient expressed understanding and agreed to proceed.   I discussed the assessment and treatment plan with the patient. The patient was provided an opportunity to ask questions and all were answered. The patient agreed with the plan and demonstrated an understanding of the instructions.   The patient was advised to call back or seek an in-person evaluation if the symptoms worsen or if the condition fails to improve as anticipated.  I provided 20 minutes of non-face-to-face time during this encounter.   Stasia Cavalier, MD

## 2023-04-13 ENCOUNTER — Encounter: Payer: Self-pay | Admitting: Physician Assistant

## 2023-04-13 MED ORDER — ZOLPIDEM TARTRATE 10 MG PO TABS: 10.0000 mg | ORAL_TABLET | Freq: Every evening | ORAL | 0 refills | Status: DC | PRN

## 2023-04-15 ENCOUNTER — Ambulatory Visit (HOSPITAL_COMMUNITY)
Admission: EM | Admit: 2023-04-15 | Discharge: 2023-04-15 | Disposition: A | Discharge status: Home / Self Care | Attending: Nurse Practitioner | Admitting: Nurse Practitioner

## 2023-04-15 DIAGNOSIS — F411 Generalized anxiety disorder: Secondary | ICD-10-CM

## 2023-04-15 DIAGNOSIS — G4700 Insomnia, unspecified: Secondary | ICD-10-CM | POA: Diagnosis not present

## 2023-04-15 DIAGNOSIS — F331 Major depressive disorder, recurrent, moderate: Secondary | ICD-10-CM

## 2023-04-15 MED ORDER — ARIPIPRAZOLE 5 MG PO TABS: 5.0000 mg | ORAL_TABLET | Freq: Every day | ORAL | 0 refills | Status: DC

## 2023-04-15 NOTE — Progress Notes (Signed)
   04/15/23 2037  [facility] Triage Screening (Walk-ins at [facility] only)  How Did You Hear About [place]? Self  What Is the Reason for Your Visit/Call Today? Pt presents to [facility] voluntarily. Pt reports having a diagnosis of Bipolar and Bipolar depression. Pt reports feeling like she is in an onset of a manic episode. Pt reports no sleep for four days, racing thughts and fleeting thoughts of SI. Pt does endorse SI and reports passive thoughts with no plan or intent. Pt denies hi. Pt does report auditory hallucinations of hearing her own voice in her head or thinking her husband is arguing with her and he is not. Pt rpeorts taking medications consitently and having a current psychiatrist and therapist to treat Bipolar. Pt denies past sucide atempts or self harm. Pt reports wanting help with medication to sleep or somewhere where she can stabalize and rest. Pt is routine.  How Long Has This Been Causing You Problems? 1 wk - 1 month  Have You Recently Had Any Thoughts About Hurting Yourself? Yes  How long ago did you have thoughts about hurting yourself? Hours ago  Are You Planning to Commit Suicide/Harm Yourself At This time? No  Have you Recently Had Thoughts About Hurting Someone Karolee Ohs? No  Are You Planning To Harm Someone At This Time? No  Are you currently experiencing any auditory, visual or other hallucinations? Yes  Please explain the hallucinations you are currently experiencing: Auditory- Own voice or arguments with husband  Have You Used Any Alcohol or Drugs in the Past 24 Hours? Yes  How long ago did you use Drugs or Alcohol? Last night  What Did You Use and How Much? CBD Gummy for sleep  Do you have any current medical co-morbidities that require immediate attention? No  Clinician description of patient physical appearance/behavior: Patient is groomed and calm and cooperative.  What Do You Feel Would Help You the Most Today? Treatment for Depression or other mood problem;Medication(s);Stress Management   If access to [facility] was not available, would you have sought care in the Emergency Department? Yes  Determination of Need Routine (7 days)  Options For Referral Intensive Outpatient Therapy;Medication Management

## 2023-04-15 NOTE — ED Triage Notes (Signed)
Pt presents to [facility] voluntarily. Pt reports having a diagnosis of Bipolar and Bipolar depression. Pt reports feeling like she is in an onset of a manic episode. Pt reports no sleep for four days, racing thughts and fleeting thoughts of SI. Pt does endorse SI and reports passive thoughts with no plan or intent. Pt denies hi. Pt does report auditory hallucinations of hearing her own voice in her head or thinking her husband is arguing with her and he is not. Pt rpeorts taking medications consitently and having a current psychiatrist and therapist to treat Bipolar. Pt denies past sucide atempts or self harm. Pt reports wanting help with medication to sleep or somewhere where she can stabalize and rest. Pt is routine.

## 2023-04-15 NOTE — ED Provider Notes (Signed)
Behavioral Health Urgent Care Medical Screening Exam  Patient Name: Morgan Chang MRN: [id number] Date of Evaluation: 04/15/23 Chief Complaint:  I feel I am having a manic episode Diagnosis:  Final diagnoses:  Moderate episode of recurrent major depressive disorder (HCC)  GAD (generalized anxiety disorder)  Insomnia, unspecified type    History of Present illness: Morgan Chang is a 27 y.o. female presenting to voluntarily to [facility] for a psychiatric evaluation. Patient reports that she has not been able to sleep at night for about four nights. Patient states that she feels that she is having a manic episodes. Patient reports that she has been feeling irritable, angry, rage, anxious with racing thoughts, worried and crying spells.   Nurse practitioner assessed patient face to face and reviewed her chart. Patient is alert oriented x3, calm and cooperative, speech is clear and coherent, mood is anxious with congruent affect. Patient denies any SI/HI or AVH and does not appear to be responding to any internal or external stimuli.   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 Patient reports that she was diagnosed with Bipolar D/O about 2 years ago. Patient reports that she is being followed by Dr. Mercy Riding for medication management. Patient is prescribed trintellix 20 mg, ambien 10 mg, clonazepam 0.5 mg and vyvanse 70 mg.    Patient denies any SI/HI or AVH. Patient will be discharged with a prescription for abilify 5mg . Patient can be safely discharged to follow up with her outpatient provider.   Flowsheet Row ED from 04/15/2023 in [facility] ED from 02/25/2023 in [facility] at [facility]) Video Visit from 12/01/2022 in [facility]  C-SSRS RISK CATEGORY Low Risk No Risk No Risk       Psychiatric Specialty Exam  Presentation  General Appearance:Casual  Eye  Contact:Good  Speech:Clear and Coherent  Speech Volume:Normal  Handedness:Right   Mood and Affect  Mood: Anxious  Affect: Appropriate   Thought Process  Thought Processes: Coherent  Descriptions of Associations:Intact  Orientation:Full (Time, Place and Person)  Thought Content:WDL    Hallucinations:None  Ideas of Reference:None  Suicidal Thoughts:No  Homicidal Thoughts:No   Sensorium  Memory: Immediate Good; Recent Good; Remote Good  Judgment: Good  Insight: Good   Executive Functions  Concentration: Good  Attention Span: Good  Recall: Good  Fund of Knowledge: Good  Language: Good   Psychomotor Activity  Psychomotor Activity: Normal   Assets  Assets: Communication Skills; Desire for Improvement; Housing; Resilience; Social Support   Sleep  Sleep: Poor  Number of hours:  3   Physical Exam: Physical Exam HENT:     Head: Normocephalic and atraumatic.     Nose: Nose normal.  Eyes:     Pupils: Pupils are equal, round, and reactive to light.  Cardiovascular:     Rate and Rhythm: Normal rate.  Abdominal:     General: Abdomen is flat.  Musculoskeletal:        General: Normal range of motion.     Cervical back: Normal range of motion.  Skin:    General: Skin is warm.  Neurological:     Mental Status: She is alert and oriented to person, place, and time.  Psychiatric:        Attention and Perception: Attention normal.        Mood and Affect: Mood normal.        Speech: Speech  normal.        Behavior: Behavior normal.        Thought Content: Thought content is not paranoid or delusional. Thought content does not include homicidal or suicidal ideation. Thought content does not include homicidal or suicidal plan.        Cognition and Memory: Cognition normal.        Judgment: Judgment normal.   Review of Systems  Constitutional: Negative.   HENT: Negative.    Eyes: Negative.   Respiratory: Negative.    Cardiovascular:  Negative.   Gastrointestinal: Negative.   Genitourinary: Negative.   Musculoskeletal: Negative.   Skin: Negative.   Neurological: Negative.   Endo/Heme/Allergies: Negative.   Psychiatric/Behavioral:  The patient is nervous/anxious.    Blood pressure 133/88, pulse 66, temperature 98 F (36.7 C), temperature source Oral, resp. rate 18, SpO2 100%, not currently breastfeeding. There is no height or weight on file to calculate BMI.  Musculoskeletal: Strength & Muscle Tone: within normal limits Gait & Station: normal Patient leans: N/A   [facility] MSE Discharge Disposition for Follow up and Recommendations: Based on my evaluation the patient does not appear to have an emergency medical condition and can be discharged with resources and follow up care in outpatient services for Medication Management and Individual Therapy   Jasper Riling, NP 04/15/2023, 9:20 PM

## 2023-04-15 NOTE — Discharge Instructions (Addendum)
  Discharge recommendations:  Patient is to take medications as prescribed. Please see information for follow-up appointment with psychiatry and therapy. Please follow up with your primary care provider for all medical related needs.  Please follow up with outpatient psychiatrist Dr. Mercy Riding for ongoing medication management.  Therapy: We recommend that patient participate in individual therapy to address mental health concerns.  Medications: The patient or guardian is to contact a medical professional and/or outpatient provider to address any new side effects that develop. The patient or guardian should update outpatient providers of any new medications and/or medication changes.   Atypical antipsychotics: If you are prescribed an atypical antipsychotic, it is recommended that your height, weight, BMI, blood pressure, fasting lipid panel, and fasting blood sugar be monitored by your outpatient providers.  Safety:  The patient should abstain from use of illicit substances/drugs and abuse of any medications. If symptoms worsen or do not continue to improve or if the patient becomes actively suicidal or homicidal then it is recommended that the patient return to the closest hospital emergency department, the [facility], or call 911 for further evaluation and treatment. National Suicide Prevention Lifeline 1-800-SUICIDE or 808-097-2460.  About 988 988 offers 24/7 access to trained crisis counselors who can help people experiencing mental health-related distress. People can call or text 988 or chat 988lifeline.org for themselves or if they are worried about a loved one who may need crisis support.  Crisis Mobile: [facility]:                     3100824888 (for crisis response 24 hours a day) [facility] Hotline:                                            (343)881-7986

## 2023-04-17 ENCOUNTER — Telehealth (HOSPITAL_COMMUNITY): Payer: Self-pay | Admitting: *Deleted

## 2023-04-17 ENCOUNTER — Other Ambulatory Visit (HOSPITAL_COMMUNITY): Payer: Self-pay | Admitting: *Deleted

## 2023-04-17 DIAGNOSIS — F3181 Bipolar II disorder: Secondary | ICD-10-CM

## 2023-04-17 MED ORDER — ARIPIPRAZOLE 5 MG PO TABS
5.0000 mg | ORAL_TABLET | Freq: Every day | ORAL | 0 refills | Status: DC
Start: 2023-04-17 — End: 2023-04-17

## 2023-04-17 MED ORDER — ARIPIPRAZOLE 5 MG PO TABS
5.0000 mg | ORAL_TABLET | Freq: Every day | ORAL | 0 refills | Status: DC
Start: 2023-04-17 — End: 2023-04-21

## 2023-04-17 NOTE — Telephone Encounter (Addendum)
Pt called stating that she is having a manic episode and the weekend was particularly bad and she did present to [facility]. Per provider note pt was d/c without any med changes but pt says med changes were mentioned and to f/u with you for med management. Pt also says she's been taking the Ambien 10 mg but hasn't really slept the last 4 nights. Pt is asking for a call from you. Pt has a f/u scheduled on 05/16/23. Please review.

## 2023-04-17 NOTE — Telephone Encounter (Addendum)
Patient was contacted via telephone.   She reports that she had been experiencing period of increased irritability, mood lability, negative self thoughts, feelings of worthlessness, insomnia, and passive SI for a 4-day period.  She denies any particular triggers other than things being a bit more stressful at home.  Patient presented to urgent care on 7/27 at which point she was evaluated discharged with a prescription of Abilify.  Patient notes that she has started to improve since presenting to urgent care in the sense that the negative self thoughts and passive SI have abated.  She does continue to struggle with insomnia.  Patient did not start the Abilify yet due to being unable to afford the medication.  Of note patient had denied a history of mania in the past on initial evaluation.  On review today she did note that she did in fact have potential hypomanic episodes in the past. With the last one in December of 2023. She denied experiencing any impulsivity during the episodes. Instead she has experienced mood lability, increased irritability, tearfullness, passive thoughts of suicide. She notes that up until now she had thought that these episodes were possibly related to medication she was on.  We reviewed her current symptoms of mood lability as well as the concerning potential hypomanic episode she is coming down from.  We explained that in the absence of mood stabilization the addition of Trintellix may have pushed her to this state.  Vyvanse as well could lead to hypomania.  In her past history Depakote and Seroquel were the only 2 medications with mood stabilizing properties that she has tried.  She reports not tolerating either medication.  Depakote had made her over sedated and lethargic while Seroquel had caused increased irritability, agitation, and questionable hallucinations.  We will refer patient for genesight testing.  We will also resend Abilify today and were able to identify a pharmacy  that would be more affordable for the patient.  We also will hold Vyvanse and decrease Trintellix to 10 mg.  We will discuss further plan at patient's next appointment in August.  Patient was instructed to reach back out if she is still struggling with insomnia or symptoms of hypomania/mania later this week.  She is aware that she should re-present to urgent care if suicidal ideation were to recur.

## 2023-04-18 ENCOUNTER — Ambulatory Visit (HOSPITAL_COMMUNITY): Admitting: *Deleted

## 2023-04-18 ENCOUNTER — Telehealth (HOSPITAL_COMMUNITY): Payer: Self-pay | Admitting: *Deleted

## 2023-04-18 NOTE — Telephone Encounter (Signed)
Pt in office today for GeneSight testing. Samples obtained, consent signed, and orders signed. Fed-Ex pick up order has been placed. Pt would like a copy of results mailed to her when available.

## 2023-04-20 ENCOUNTER — Ambulatory Visit (INDEPENDENT_AMBULATORY_CARE_PROVIDER_SITE_OTHER): Admitting: Physician Assistant

## 2023-04-20 ENCOUNTER — Encounter: Payer: Self-pay | Admitting: Physician Assistant

## 2023-04-20 VITALS — BP 100/60 | HR 73 | Temp 97.1°F | Ht 66.0 in | Wt 176.5 lb

## 2023-04-20 DIAGNOSIS — E559 Vitamin D deficiency, unspecified: Secondary | ICD-10-CM | POA: Diagnosis not present

## 2023-04-20 DIAGNOSIS — Z0000 Encounter for general adult medical examination without abnormal findings: Secondary | ICD-10-CM

## 2023-04-20 DIAGNOSIS — E663 Overweight: Secondary | ICD-10-CM | POA: Diagnosis not present

## 2023-04-20 DIAGNOSIS — M2550 Pain in unspecified joint: Secondary | ICD-10-CM

## 2023-04-20 DIAGNOSIS — Z13220 Encounter for screening for lipoid disorders: Secondary | ICD-10-CM

## 2023-04-20 DIAGNOSIS — Z8632 Personal history of gestational diabetes: Secondary | ICD-10-CM

## 2023-04-20 DIAGNOSIS — Z8742 Personal history of other diseases of the female genital tract: Secondary | ICD-10-CM

## 2023-04-20 DIAGNOSIS — Z136 Encounter for screening for cardiovascular disorders: Secondary | ICD-10-CM | POA: Diagnosis not present

## 2023-04-20 DIAGNOSIS — R5383 Other fatigue: Secondary | ICD-10-CM | POA: Diagnosis not present

## 2023-04-20 DIAGNOSIS — F902 Attention-deficit hyperactivity disorder, combined type: Secondary | ICD-10-CM

## 2023-04-20 DIAGNOSIS — Z1159 Encounter for screening for other viral diseases: Secondary | ICD-10-CM

## 2023-04-20 DIAGNOSIS — G4700 Insomnia, unspecified: Secondary | ICD-10-CM

## 2023-04-20 LAB — COMPREHENSIVE METABOLIC PANEL
ALT: 14 U/L (ref 0–35)
AST: 17 U/L (ref 0–37)
Albumin: 5.1 g/dL (ref 3.5–5.2)
Alkaline Phosphatase: 69 U/L (ref 39–117)
BUN: 18 mg/dL (ref 6–23)
CO2: 25 mEq/L (ref 19–32)
Calcium: 9.9 mg/dL (ref 8.4–10.5)
Chloride: 102 mEq/L (ref 96–112)
Creatinine, Ser: 0.86 mg/dL (ref 0.40–1.20)
GFR: 91.52 mL/min (ref >60.00)
Glucose, Bld: 102 mg/dL — ABNORMAL HIGH (ref 70–99)
Potassium: 4.2 mEq/L (ref 3.5–5.1)
Sodium: 136 mEq/L (ref 135–145)
Total Bilirubin: 0.3 mg/dL (ref 0.2–1.2)
Total Protein: 8.1 g/dL (ref 6.0–8.3)

## 2023-04-20 LAB — SEDIMENTATION RATE: Sed Rate: 3 mm/hr (ref 0–20)

## 2023-04-20 LAB — CBC WITH DIFFERENTIAL/PLATELET
Basophils Absolute: 0.1 10*3/uL (ref 0.0–0.1)
Basophils Relative: 0.9 % (ref 0.0–3.0)
Eosinophils Absolute: 0.1 10*3/uL (ref 0.0–0.7)
Eosinophils Relative: 1.8 % (ref 0.0–5.0)
HCT: 44.8 % (ref 36.0–46.0)
Hemoglobin: 15.2 g/dL — ABNORMAL HIGH (ref 12.0–15.0)
Lymphocytes Relative: 28.4 % (ref 12.0–46.0)
Lymphs Abs: 1.8 10*3/uL (ref 0.7–4.0)
MCHC: 33.8 g/dL (ref 30.0–36.0)
MCV: 91.6 fl (ref 78.0–100.0)
Monocytes Absolute: 0.5 10*3/uL (ref 0.1–1.0)
Monocytes Relative: 8.2 % (ref 3.0–12.0)
Neutro Abs: 3.9 10*3/uL (ref 1.4–7.7)
Neutrophils Relative %: 60.7 % (ref 43.0–77.0)
Platelets: 314 10*3/uL (ref 150.0–400.0)
RBC: 4.9 Mil/uL (ref 3.87–5.11)
RDW: 12.7 % (ref 11.5–15.5)
WBC: 6.4 10*3/uL (ref 4.0–10.5)

## 2023-04-20 LAB — VITAMIN D 25 HYDROXY (VIT D DEFICIENCY, FRACTURES): VITD: 52.77 ng/mL (ref 30.00–100.00)

## 2023-04-20 LAB — LIPID PANEL
Cholesterol: 167 mg/dL (ref 0–200)
HDL: 56.3 mg/dL (ref >39.00)
LDL Cholesterol: 91 mg/dL (ref 0–99)
NonHDL: 110.44
Total CHOL/HDL Ratio: 3
Triglycerides: 99 mg/dL (ref 0.0–149.0)
VLDL: 19.8 mg/dL (ref 0.0–40.0)

## 2023-04-20 LAB — HEMOGLOBIN A1C: Hgb A1c MFr Bld: 5.2 % (ref 4.6–6.5)

## 2023-04-20 LAB — C-REACTIVE PROTEIN: CRP: <1 mg/dL (ref 0.5–20.0)

## 2023-04-20 MED ORDER — ZOLPIDEM TARTRATE ER 6.25 MG PO TBCR: 6.2500 mg | EXTENDED_RELEASE_TABLET | Freq: Every evening | ORAL | 0 refills | Status: DC | PRN

## 2023-04-20 MED ORDER — LISDEXAMFETAMINE DIMESYLATE 50 MG PO CAPS: 50.0000 mg | ORAL_CAPSULE | Freq: Every day | ORAL | 0 refills | Status: DC

## 2023-04-20 NOTE — Progress Notes (Signed)
Subjective:    Morgan Chang is a 27 y.o. female and is here for a comprehensive physical exam.  HPI  Health Maintenance Due  Topic Date Due   Hepatitis C Screening  Never done   INFLUENZA VACCINE  04/20/2023    Acute Concerns: Concerns for possible PCOS Reports increased hair growth on her face. States she was previously diagnosed with PCOS, but was not informed. She is still established with an OBGYN. Notes she has previous has cystic growth. Denies difficulty getting pregnant  Chronic Issues: Joint Pain: Reports constant pain and inflammation Notes occasional relief with improved diet and exercise. Reports hand occasionally cramps when riding bike. Reports maternal Fhx of arthritis.  ADHD Taking Vyvanse 70 mg daily Would like lower dose -- feels this is too high Worsening anxiety at times -- plans to follow-up with psychiatry after genesight testing performed  History of Gestational diabetes mellitus Needs hgba1c rechecked today  Insomnia Ongoing Would like to trial extended release ambien  Health Maintenance: Immunizations -- UTD Colonoscopy -- N/A Mammogram -- N/A PAP -- Scheduled to be performed 8/15 Bone Density -- N/A Diet -- Anti-inflammatory diet  Exercise -- Past 2 weeks has begun walking  Sleep habits -- uses 10 mg Ambien when needed, otherwise normal Mood -- Recently began visiting Everlena Cooper, MD  UTD with dentist? - Yes UTD with eye doctor? - Yes  Weight history: Wt Readings from Last 10 Encounters:  10/18/22 170 lb (77.1 kg)  09/30/22 174 lb (78.9 kg)  09/06/22 183 lb 12.8 oz (83.4 kg)  08/25/22 190 lb 6.4 oz (86.4 kg)  06/27/22 181 lb 12.8 oz (82.5 kg)  06/03/22 185 lb 8 oz (84.1 kg)  03/25/22 198 lb (89.8 kg)  12/28/21 220 lb 4 oz (99.9 kg)  12/13/21 214 lb (97.1 kg)  11/01/21 225 lb (102.1 kg)   There is no height or weight on file to calculate BMI. No LMP recorded.  Alcohol use:  reports no history of alcohol  use.  Tobacco use:  Tobacco Use: Low Risk  (03/06/2023)   Patient History    Smoking Tobacco Use: Never    Smokeless Tobacco Use: Never    Passive Exposure: Not on file   Eligible for lung cancer screening? no     12/01/2022    1:57 PM  Depression screen PHQ 2/9  Decreased Interest   Down, Depressed, Hopeless   PHQ - 2 Score   Altered sleeping   Tired, decreased energy   Change in appetite   Feeling bad or failure about yourself    Trouble concentrating   Moving slowly or fidgety/restless   Suicidal thoughts   PHQ-9 Score      Information is confidential and restricted. Go to Review Flowsheets to unlock data.     Other providers/specialists: Patient Care Team: [name], [name] as PCP - General (Physician Assistant) Obgyn, Ma Hillock    PMHx, SurgHx, SocialHx, Medications, and Allergies were reviewed in the Visit Navigator and updated as appropriate.   Past Medical History:  Diagnosis Date   Anemia 09/14/2021   Anxiety    Depression    Endometriosis    Fibroid    Frank breech presentation 10/29/2016   Gestational diabetes    Migraines    Pregnancy induced hypertension    S/P cesarean section 10/29/2016     Past Surgical History:  Procedure Laterality Date   ADENOIDECTOMY  10/29/2007   BREAST REDUCTION SURGERY     CESAREAN SECTION N/A 10/29/2016  Procedure: CESAREAN SECTION;  Surgeon: Sherian Rein, MD;  Location: [facility] [name] [facility] BIRTHING SUITES;  Service: Obstetrics;  Laterality: N/A;   CESAREAN SECTION N/A 07/25/2021   Procedure: Repeat CESAREAN SECTION;  Surgeon: Noland Fordyce, MD;  Location: [facility] LD ORS;  Service: Obstetrics;  Laterality: N/A;   Repeat C/S BTL   LAPAROSCOPY  12/10/2014   NISSEN FUNDOPLICATION     RHINOPLASTY  03/22/2010   deviated septum   TONSILLECTOMY  2017   TUBAL LIGATION  07/25/2021     Family History  Problem Relation Age of Onset   Heart disease Mother    Hypertension Mother    Anxiety disorder Mother    Depression Mother     Hypertension Father    Anxiety disorder Father    Depression Father    Testicular cancer Brother     Social History   Tobacco Use   Smoking status: Never   Smokeless tobacco: Never  Vaping Use   Vaping status: Never Used  Substance Use Topics   Alcohol use: No   Drug use: No    Review of Systems:   Review of Systems  Constitutional:  Negative for chills, fever, malaise/fatigue and weight loss.  HENT:  Negative for hearing loss, sinus pain and sore throat.   Respiratory:  Negative for cough and hemoptysis.   Cardiovascular:  Negative for chest pain, palpitations, leg swelling and PND.  Gastrointestinal:  Negative for abdominal pain, constipation, diarrhea, heartburn, nausea and vomiting.  Genitourinary:  Negative for dysuria, frequency and urgency.  Musculoskeletal:  Positive for myalgias (constant). Negative for back pain and neck pain.       (+) Inflammation   Skin:  Negative for itching and rash.       (+) Increased Hair Growth (Face)  Neurological:  Negative for dizziness, tingling, seizures and headaches.  Endo/Heme/Allergies:  Negative for polydipsia.  Psychiatric/Behavioral:  Negative for depression. The patient is not nervous/anxious.     Objective:   There were no vitals taken for this visit. There is no height or weight on file to calculate BMI.   General Appearance:    Alert, cooperative, no distress, appears stated age  Head:    Normocephalic, without obvious abnormality, atraumatic  Eyes:    PERRL, conjunctiva/corneas clear, EOM's intact, fundi    benign, both eyes  Ears:    Normal TM's and external ear canals, both ears  Nose:   Nares normal, septum midline, mucosa normal, no drainage    or sinus tenderness  Throat:   Lips, mucosa, and tongue normal; teeth and gums normal  Neck:   Supple, symmetrical, trachea midline, no adenopathy;    thyroid:  no enlargement/tenderness/nodules; no carotid   bruit or JVD  Back:     Symmetric, no curvature, ROM  normal, no CVA tenderness  Lungs:     Clear to auscultation bilaterally, respirations unlabored  Chest Wall:    No tenderness or deformity   Heart:    Regular rate and rhythm, S1 and S2 normal, no murmur, rub or gallop  Breast Exam:    Deferred  Abdomen:     Soft, non-tender, bowel sounds active all four quadrants,    no masses, no organomegaly  Genitalia:    Deferred  Extremities:   Extremities normal, atraumatic, no cyanosis or edema  Pulses:   2+ and symmetric all extremities  Skin:   Skin color, texture, turgor normal, no rashes or lesions  Lymph nodes:   Cervical, supraclavicular, and axillary nodes normal  Neurologic:  CNII-XII intact, normal strength, sensation and reflexes    throughout    Assessment/Plan:   Routine physical examination Today patient counseled on age appropriate routine health concerns for screening and prevention, each reviewed and up to date or declined. Immunizations reviewed and up to date or declined. Labs ordered and reviewed. Risk factors for depression reviewed and negative. Hearing function and visual acuity are intact. ADLs screened and addressed as needed. Functional ability and level of safety reviewed and appropriate. Education, counseling and referrals performed based on assessed risks today. Patient provided with a copy of personalized plan for preventive services.  Arthralgia, unspecified joint; Other fatigue Update autoimmune testing per patient request Low threshold to refer to rheum  History of gestational diabetes Update Hemoglobin A1c and provide recommendations  Vitamin D deficiency Update vitamin D and provide recommendations  Overweight Continue efforts at healthy lifestyle  Attention deficit hyperactivity disorder (ADHD), combined type Will decrease Vyvanse to 50 mg to help reduce possible side effects Reach out if after a few weeks of this dose to see if this is effective or any concerns  Insomnia, unspecified type Will trial  Ambien 6.25 mg extended release Hopefully genesight testing will be helpful for this - consider asking psychiatry to take over management of this  Encounter for lipid screening for cardiovascular disease Update lipid panel  Encounter for screening for other viral diseases Update hepatitis C  History of ovarian cyst She has appointment scheduled soon with gynecology -- defer work up for this and possible PCOS? Testing with them  I,Emily Lagle,acting as a scribe for [employer], PA.,have documented all relevant documentation on the behalf of [name], PA,as directed by  [name], PA while in the presence of [name], [name].  I, [name], [name], have reviewed all documentation for this visit. The documentation on 04/20/23 for the exam, diagnosis, procedures, and orders are all accurate and complete.  [name], [name] [facility]

## 2023-04-20 NOTE — Patient Instructions (Signed)
It was great to see you! ? ?Please go to the lab for blood work.  ? ?Our office will call you with your results unless you have chosen to receive results via MyChart. ? ?If your blood work is normal we will follow-up each year for physicals and as scheduled for chronic medical problems. ? ?If anything is abnormal we will treat accordingly and get you in for a follow-up. ? ?Take care, ? ?Samantha ?  ? ? ?

## 2023-04-21 ENCOUNTER — Telehealth (HOSPITAL_COMMUNITY): Payer: Self-pay | Admitting: *Deleted

## 2023-04-21 DIAGNOSIS — F3181 Bipolar II disorder: Secondary | ICD-10-CM

## 2023-04-21 MED ORDER — OLANZAPINE 5 MG PO TABS: 5.0000 mg | ORAL_TABLET | Freq: Every day | ORAL | 0 refills | Status: DC

## 2023-04-21 NOTE — Telephone Encounter (Signed)
Patient was contacted via telephone.  She reports that she is doing much better in regards to mood being stable throughout the day however her sleep at night remains an issue.  Patient is only sleeping around an hour and 14 minutes a night currently.  Of note she did only start the Abilify a couple days ago due to cost.  She had also started Ambien CR prescribed by her PCP without benefit.  While the Abilify would likely show positive effect the patient continued to take it the time frame likely to delay.  At this point we would recommend discontinuing Abilify and starting Zyprexa for more potent antipsychotic effect and sedation.  Risk and benefits of this medication were reviewed with the patient.  She was agreeable to this and 5 mg prescription of Zyprexa nightly was sent to the pharmacy.  Patient was instructed to call back on Monday if sleep remains an issue or if she is experiencing adverse side effects.

## 2023-04-21 NOTE — Telephone Encounter (Signed)
Writer spoke with pt who called with c/o continued insomnia. Pt says that it's been 7 nights with an average of 1 hour sleep per night. I asked if pt took the Ambien 6.25 mg CR that Dr. Bufford Buttner ordered yesterday. Pt says that the medication "only made things worse". GeneSight results still pending. Pt next visit scheduled for 05/16/23. Please review and advise.

## 2023-04-24 ENCOUNTER — Other Ambulatory Visit: Payer: Self-pay | Admitting: Physician Assistant

## 2023-04-24 ENCOUNTER — Other Ambulatory Visit (HOSPITAL_COMMUNITY): Payer: Self-pay

## 2023-04-24 ENCOUNTER — Encounter: Payer: Self-pay | Admitting: Physician Assistant

## 2023-04-24 DIAGNOSIS — M2550 Pain in unspecified joint: Secondary | ICD-10-CM

## 2023-04-24 DIAGNOSIS — R5383 Other fatigue: Secondary | ICD-10-CM

## 2023-04-24 DIAGNOSIS — R768 Other specified abnormal immunological findings in serum: Secondary | ICD-10-CM

## 2023-04-24 DIAGNOSIS — D582 Other hemoglobinopathies: Secondary | ICD-10-CM

## 2023-04-24 MED ORDER — LISDEXAMFETAMINE DIMESYLATE 50 MG PO CAPS
50.0000 mg | ORAL_CAPSULE | Freq: Every day | ORAL | 0 refills | Status: DC
  Filled 2023-04-24: qty 30, 30d supply, fill #0

## 2023-04-27 ENCOUNTER — Telehealth (HOSPITAL_COMMUNITY): Payer: Self-pay

## 2023-04-27 NOTE — Telephone Encounter (Signed)
This patient was started on Zyprexa on Friday. Patient called today to say that she is having weird mouth movement that is a little uncomfortable for her - she believes this is medication related. Please review and advise, thank you

## 2023-04-27 NOTE — Telephone Encounter (Signed)
I will recommend to discontinue olanzapine.  If symptoms disappear that it could be medication related.  She can try Seroquel 50 mg at bedtime.  If symptoms do not improve then she need to call us back

## 2023-04-28 ENCOUNTER — Encounter: Payer: Self-pay | Admitting: Physician Assistant

## 2023-04-28 ENCOUNTER — Ambulatory Visit: Admitting: Physician Assistant

## 2023-04-28 VITALS — BP 117/84 | HR 83 | Temp 98.7°F | Ht 67.0 in | Wt 186.4 lb

## 2023-04-28 DIAGNOSIS — K649 Unspecified hemorrhoids: Secondary | ICD-10-CM

## 2023-04-28 DIAGNOSIS — R1032 Left lower quadrant pain: Secondary | ICD-10-CM

## 2023-04-28 DIAGNOSIS — R1013 Epigastric pain: Secondary | ICD-10-CM | POA: Diagnosis not present

## 2023-04-28 DIAGNOSIS — K5904 Chronic idiopathic constipation: Secondary | ICD-10-CM | POA: Diagnosis not present

## 2023-04-28 DIAGNOSIS — K219 Gastro-esophageal reflux disease without esophagitis: Secondary | ICD-10-CM

## 2023-04-28 MED ORDER — OMEPRAZOLE 40 MG PO CPDR
40.0000 mg | DELAYED_RELEASE_CAPSULE | Freq: Two times a day (BID) | ORAL | 2 refills | Status: DC
Start: 2023-04-28 — End: 2023-06-06

## 2023-04-28 MED ORDER — DICYCLOMINE HCL 10 MG PO CAPS
10.0000 mg | ORAL_CAPSULE | Freq: Three times a day (TID) | ORAL | 2 refills | Status: DC
Start: 2023-04-28 — End: 2023-06-06

## 2023-04-28 NOTE — Patient Instructions (Addendum)
CT scheduled @ [facility] on 05/04/23 @ 1:00 pm.  Nothing to eat/drink 4 hours prior.   For External Hemorrhoids: Warm water sitz bath with epsom salt for flare up of external hemorrhoids. Use OTC Preparation H, Tucks Pads, and Witch Hazel wipes as needed. Treat Constipation.

## 2023-04-28 NOTE — Progress Notes (Signed)
Morgan Amy, [name] [address]  [address]  [place], [place] [address]  Main: [phone]  Fax: [phone]   Gastroenterology Consultation  Referring Provider:     Jarold Chang, [name] Primary Care Physician:  Morgan Chang, [name] Primary Gastroenterologist:  Morgan Amy, [name]  Reason for Consultation:     Abdominal Pain        HPI:   Morgan Chang is a 27 y.o. y/o female referred for consultation & management  by Morgan Motto, PA.    Patient has epigastric pain since 09/2022.  Had normal RUQ abdominal ultrasound 10/18/2022.  Was given PPI for 2 weeks with benefit.  No longer on PPI or H2 RB medication.  She admits to occasional episode of heartburn.  Denies dysphagia or unintentional weight loss.  Denies nausea or vomiting.  Remote history of Nissen fundoplication surgery in 2016.  Remote history of stomach ulcers per patient report, records unavailable.  No recent H. pylori test.  Lab 04/20/2023 showed normal CMP and CBC results.  Negative HCV.  Hemoglobin 15.2, WBC 6.4.  Normal LFTs.  She also has LLQ abdominal pain since February 2024.  She saw her OB/GYN yesterday and is scheduled for pelvic ultrasound next week.  Has not had a CT scan.  She admits to chronic constipation for many years.  Episodes of mild bright red blood on the tissue if she strains to have bowel movement.  She has had external hemorrhoids since giving childbirth.  She states when she takes her Vyvanse it helps with her constipation.  Family history significant for mom with IBS, maternal grandma with Crohn's, maternal grandpa with colon cancer.  Patient has never had a colonoscopy.  Past Medical History:  Diagnosis Date   Anemia 09/14/2021   Anxiety    Depression    Endometriosis    Fibroid    Frank breech presentation 10/29/2016   GERD (gastroesophageal reflux disease) 12/10/2014   Gestational diabetes    Migraines    Pregnancy induced hypertension    S/P cesarean section 10/29/2016     Past Surgical History:  Procedure Laterality Date   ADENOIDECTOMY  10/29/2007   BREAST REDUCTION SURGERY     BREAST SURGERY  08/2015   CESAREAN SECTION N/A 10/29/2016   Procedure: CESAREAN SECTION;  Surgeon: Sherian Rein, MD;  Location: [facility] BIRTHING SUITES;  Service: Obstetrics;  Laterality: N/A;   CESAREAN SECTION N/A 07/25/2021   Procedure: Repeat CESAREAN SECTION;  Surgeon: Noland Fordyce, MD;  Location: [facility] LD ORS;  Service: Obstetrics;  Laterality: N/A;   Repeat C/S BTL   LAPAROSCOPY  12/10/2014   NISSEN FUNDOPLICATION     RHINOPLASTY  03/22/2010   deviated septum   TONSILLECTOMY  2017   TUBAL LIGATION  07/25/2021    Prior to Admission medications   Medication Sig Start Date End Date Taking? Authorizing Provider  clonazePAM (KLONOPIN) 0.5 MG tablet Take 1 tablet (0.5 mg total) by mouth daily as needed for anxiety. 03/30/23 03/29/24 Yes Stasia Cavalier, MD  lisdexamfetamine (VYVANSE) 50 MG capsule Take 1 capsule (50 mg total) by mouth daily. 04/24/23  Yes Morgan Motto, PA  naproxen (NAPROSYN) 500 MG tablet as needed for mild pain.   Yes [provider]  OLANZapine (ZYPREXA) 5 MG tablet Take 1 tablet (5 mg total) by mouth at bedtime. 04/21/23  Yes Stasia Cavalier, MD  vortioxetine HBr (TRINTELLIX) 10 MG TABS tablet Take 10 mg by mouth daily.   Yes [provider]  zolpidem (AMBIEN CR)  6.25 MG CR tablet Take 1 tablet (6.25 mg total) by mouth at bedtime as needed for sleep. 04/20/23  Yes Morgan Motto, PA    Family History  Problem Relation Age of Onset   Heart disease Mother    Hypertension Mother    Anxiety disorder Mother    Depression Mother    Alcohol abuse Mother    Arthritis Mother    Hypertension Father    Anxiety disorder Father    Depression Father    Heart attack Father    Heart failure Father    Testicular cancer Brother    Diabetes Maternal Grandmother    Heart attack Maternal Grandfather        x 2     Social History    Tobacco Use   Smoking status: Never   Smokeless tobacco: Never  Vaping Use   Vaping status: Never Used  Substance Use Topics   Alcohol use: Yes    Comment: less than one drink per month   Drug use: No    Allergies as of 04/28/2023   (No Known Allergies)    Review of Systems:    All systems reviewed and negative except where noted in HPI.   Physical Exam:  BP 117/84   Pulse 83   Temp 98.7 F (37.1 C)   Ht 5\' 7"  (1.702 m)   Wt 186 lb 6.4 oz (84.6 kg)   LMP 04/20/2023 (Exact Date)   BMI 29.19 kg/m  Patient's last menstrual period was 04/20/2023 (exact date). Psych:  Alert and cooperative. Normal mood and affect. General:   Alert,  Well-developed, well-nourished, pleasant and cooperative in NAD Head:  Normocephalic and atraumatic. Eyes:  Sclera clear, no icterus.   Conjunctiva pink. Lungs:  Respirations even and unlabored.  Clear throughout to auscultation.   No wheezes, crackles, or rhonchi. No acute distress. Heart:  Regular rate and rhythm; no murmurs, clicks, rubs, or gallops. Abdomen:  Normal bowel sounds.  No bruits.  Soft, and non-distended without masses, hepatosplenomegaly or hernias noted.  Moderate epigastric and LLQ tenderness.  Rest of abdomen is not tender.  No guarding or rebound tenderness.    Neurologic:  Alert and oriented x3;  grossly normal neurologically. Psych:  Alert and cooperative. Normal mood and affect.  Imaging Studies: No results found.  Assessment and Plan:   Morgan Chang is a 27 y.o. y/o female has been referred for epigastric and LLQ abdominal pain.  I am ordering abdominal pelvic CT and H. pylori test to begin her evaluation.  Also giving trial of treatment with close follow-up.  If symptoms persist, then EGD and colonoscopy are the next steps.  Recent labs were normal, unrevealing.  No anemia.  Epigastric Pain Check H. Pylori stool test. Recent Abd [place] & labs Normal Order abdominal pelvic CT with contrast Start omeprazole 40 Mg once  daily after completing H. pylori stool.  LLQ Pain  Saw GYN last week and is scheduled for Pelvic US next week  Order abdominal pelvic CT with contrast.  Try dicyclomine 10 Mg 1 tablet 3 times daily.  Treat Constipation.  Chronic Constipation Recommend High Fiber diet with fruits, vegetables, and whole grains. Drink 64 ounces of Fluids Daily. Start Miralax Mix 1 capful in a drink daily. Start Benefiber Mix 1 TBSP in a drink daily.  Hemorrhoids  Discussed treatment For External Hemorrhoids: Warm water sitz bath with epsom salt for flare up of external hemorrhoids. Use OTC Preparation H, Tucks Pads, and Witch Hazel wipes  as needed. Treat Constipation.  Hx Nissen fundoplication/ GERD  Hx Peptic Ulcer Check H. Pylori; avoid NSAIDs; start PPI.  Follow up in 4 weeks with TG.  Morgan Amy, [name]

## 2023-04-30 NOTE — Progress Notes (Signed)
Call and notify patient H. pylori stool test is positive.  Please start treatment: 1.  Amoxicillin 500 mg, take 2 tablets twice daily for 14 days, #56, no refills. 2.  Clarithromycin 500 mg, take 1 tablet twice daily for 14 days, #28, no refills. 3 . Start omeprazole 40 mg 1 tablet twice daily for 14 days, #28, no refills. Continue with plan for abdominal pelvic CT and follow-up office visit

## 2023-05-01 ENCOUNTER — Other Ambulatory Visit: Payer: Self-pay

## 2023-05-01 ENCOUNTER — Telehealth: Payer: Self-pay

## 2023-05-01 MED ORDER — CLARITHROMYCIN 500 MG PO TABS: 500.0000 mg | ORAL_TABLET | Freq: Two times a day (BID) | ORAL | 0 refills | Status: DC

## 2023-05-01 MED ORDER — OMEPRAZOLE 40 MG PO CPDR: 40.0000 mg | DELAYED_RELEASE_CAPSULE | Freq: Every day | ORAL | 3 refills | Status: DC

## 2023-05-01 MED ORDER — AMOXICILLIN 500 MG PO TABS: 500.0000 mg | ORAL_TABLET | Freq: Two times a day (BID) | ORAL | 0 refills | Status: DC

## 2023-05-01 NOTE — Telephone Encounter (Signed)
Spoke with patient regarding positive h-pylori test and medications sent to pharmacy.

## 2023-05-01 NOTE — Telephone Encounter (Signed)
Spoke with patient-RX sent to pharmacy  Call and notify patient H. pylori stool test is positive.  Please start treatment:  1.  Amoxicillin 500 mg, take 2 tablets twice daily for 14 days, #56, no refills.  2.  Clarithromycin 500 mg, take 1 tablet twice daily for 14 days, #28, no refills.  3 . Start omeprazole 40 mg 1 tablet twice daily for 14 days, #28, no refills.  Continue with plan for abdominal pelvic CT and follow-up office visit

## 2023-05-01 NOTE — Telephone Encounter (Signed)
Patient returned the call wanting to speak to [facility].

## 2023-05-04 ENCOUNTER — Ambulatory Visit: Admission: RE | Admit: 2023-05-04 | Source: Ambulatory Visit

## 2023-05-07 ENCOUNTER — Other Ambulatory Visit (HOSPITAL_COMMUNITY): Payer: Self-pay | Admitting: Psychiatry

## 2023-05-07 DIAGNOSIS — F410 Panic disorder [episodic paroxysmal anxiety] without agoraphobia: Secondary | ICD-10-CM

## 2023-05-07 DIAGNOSIS — F411 Generalized anxiety disorder: Secondary | ICD-10-CM

## 2023-05-09 ENCOUNTER — Telehealth (HOSPITAL_COMMUNITY): Payer: Self-pay | Admitting: *Deleted

## 2023-05-09 NOTE — Telephone Encounter (Signed)
Pt called this morning with continued c/o "muscle tremors' and mouth movements causing issues with her speech. Pt also stated that she is only getting 2-3 hours of sleep. Pt asked again about going back to Abilify. Pt has an appointment upcoming on 05/15/26.

## 2023-05-09 NOTE — Telephone Encounter (Signed)
Patient was contacted via telephone for approx 5 minutes.   She had noticed that the motor issues she had described previously have been occurring intermittently since starting the Zyprexa.  It still occurs primarily in the mornings and is this odd mouth movement which makes it more difficult for her to get her words out.  In addition she has experienced 6 pounds of weight gain since starting on Zyprexa.  There has been no improvement in sleep during this time.  Patient still sleeping a few hours a night and finds herself fatigued the next day.  She is also taking the increased dose of Ambien with no sleep improvement.  Based off the limited improvement and adverse side effects we would recommend discontinuing Zyprexa and restarting Abilify 5 mg daily at this time.  We also discussed her symptoms and based on patient's report it does not sound like active mania and instead more insomnia at this time.  Patient will reach out if there is any further issues otherwise will follow up with her appointment next Tuesday.

## 2023-05-10 ENCOUNTER — Encounter: Payer: Self-pay | Admitting: Physician Assistant

## 2023-05-10 LAB — HM PAP SMEAR: HPV, high-risk: NEGATIVE

## 2023-05-15 ENCOUNTER — Ambulatory Visit
Admission: RE | Admit: 2023-05-15 | Discharge: 2023-05-15 | Disposition: A | Discharge status: Home / Self Care | Source: Ambulatory Visit | Attending: Physician Assistant | Admitting: Physician Assistant

## 2023-05-15 DIAGNOSIS — R1013 Epigastric pain: Secondary | ICD-10-CM | POA: Insufficient documentation

## 2023-05-15 MED ORDER — IOHEXOL 300 MG/ML  SOLN
100.0000 mL | Freq: Once | INTRAMUSCULAR | Status: AC | PRN
  Administered 2023-05-15: 100 mL via INTRAVENOUS

## 2023-05-15 NOTE — Progress Notes (Unsigned)
BH MD/PA/NP OP Progress Note  05/16/2023 11:03 AM Morgan Chang  MRN:  [id number]  Visit Diagnosis:    ICD-10-CM   1. Bipolar 2 disorder (HCC)  F31.81 ARIPiprazole (ABILIFY) 5 MG tablet    vortioxetine HBr (TRINTELLIX) 10 MG TABS tablet    ARIPiprazole (ABILIFY) 5 MG tablet    lamoTRIgine (LAMICTAL) 25 MG tablet    eszopiclone (LUNESTA) 1 MG TABS tablet    2. Generalized anxiety disorder  F41.1 ARIPiprazole (ABILIFY) 5 MG tablet    vortioxetine HBr (TRINTELLIX) 10 MG TABS tablet    clonazePAM (KLONOPIN) 0.5 MG tablet    ARIPiprazole (ABILIFY) 5 MG tablet    eszopiclone (LUNESTA) 1 MG TABS tablet    3. Panic attack as reaction to stress  F41.0 clonazePAM (KLONOPIN) 0.5 MG tablet   F43.0 eszopiclone (LUNESTA) 1 MG TABS tablet        Assessment: Morgan Chang is a 27 y.o. female with a history of MDD, sleep difficulties and reported ADHD who presented to [facility] at [facility] for initial evaluation on 12/02/2022.  During initial evaluation patient reported significant symptoms of anxiety including excessive worry control, difficulty relaxing, racing thoughts worse at night that affects sleep, restlessness, increased irritability, and fears about happening.  She also endorsed experiencing panic attacks which started in December 2023 and occur once every couple days.  During the episodes of panic patient reported symptoms of diaphoresis, increased restlessness, picking behaviors, and dissociations.  Patient also does note some depressive symptoms, that appear to be a bit better managed after starting the Trintellix.  She denied any SI or thoughts of self-harm along with any history of mania or psychosis. Patient met criteria for generalized anxiety disorder and panic attacks.   Over the course of treatment patient had an episode with concern for a manic episode in July-August of 2024.  Patient had described increased irritability, mood lability, negative self  thoughts, feelings of worthlessness, insomnia, and passive SI for a 4-day period. Patient had presented to [facility] who started Abilify and patient noted some improvement in her symptoms following this. With this episode patient also meets criteria for Bipolar 2 disorder.  Morgan Chang presents for follow-up evaluation. Today, 05/16/23, patient had concern for a hypomanic episode in the interim for which she presented to [facility].  She had been contacted multiple times and started on Abilify in the interim with her intelligence being decreased to 10 mg and Vyvanse being held initially.  Patient had also tried Zyprexa however had been unable to tolerate the medication.  Hypomania and suicidality improved with the initiation of Abilify.  Sleep has improved to a degree though remains poor.  We will start Lamictal 25 mg and titrate to 50 mg in 2 weeks.  Also discussed taking over patient's sleep medication regimen and will discontinue Ambien today to start Lunesta in its place.  Risk and benefits of both these were reviewed.  Patient will follow up in a month.  Plan: - Decrease Trintellix to 10 mg  - Start Lamictal 25 mg every day for 2 weeks before increasing to 50 mg daily - Start Abilify 5 mg QD - Continue Klonopin 0.5 mg every day prn for anxiety - Decrease Vyvanse 50 mg QD managed by her PCP - Discontinue Ambien 10 mg at bedtime - Start Lunesta 1 mg QHS - CMP, CBC, TSH, Vit D reviewed - Crisis resources reviewed - Therapy will start on 9/12 - Follow up in a month   Chief Complaint:  Chief Complaint  Patient presents with   Follow-up   HPI: Morgan Chang had reached out and was contacted in the interim reporting that she was having concern for manic episode. She did also report to [facility] during that time. Happiness described increased irritability, mood lability, negative self thoughts, feelings of worthlessness, insomnia, and passive SI for a 4-day period. After presenting to [facility] she was discharged on  Abilify. This did help improve her mood symptoms, though insomnia remained an issue. We changed Abilify to Zyprexa however patient began to motor side effects effecting her speech without improvement in sleep. Patient was switched back to Abilify and recommended to hold Vyvanse at that time.  Patient had held it up until recently where she has restarted it at 50 mg daily.  She has been taking it more intermittently though only on days where the depression is more severe.  This provider had also communicated with patients PCP in the interim about her Ambien and Vyvnase prescriptions. We agreed to take over the Ambien prescription while PCP will continue to manage her Vyvnase.   On presentation today Morgan Chang reports that she has been on the abilify for about a week and has not noticed negative side effects. A lot of the more severe mood lability and irritability has improved.  She can have still experience intermittent depressed days, which often occur after she has a few days where she had increased activity and was in a good mood.  For instance patient may clean a lot of the house etc, for several days which would then this be followed by a depressive episode for 3-4 days.  During the depressive episodes the severity of the depression can fluctuate her days that she is able to work through it and other days she just wants to lay on the couch.  It has not been as significant as it was previously.  Patient has identified that days that she is sleeping less tend to be associated with worse depressive episodes.  On review of the symptoms Morgan Chang notes that she thinks that they have actually been going on for a year now and she has never really noticed it. We did review for other potential triggers and she denies any association to interpersonal conflict or menstrual cycle.  As for family history she does suspect similar issues in her maternal side however nobody has sought out mental health for it.  Patient has  been taking Midrin Telex at 10 mg since it was decreased following the concern for mania.  She has also decreased the Klonopin dosing to as needed around once every 2 days.  Her PCP had made some adjustments on the Ambien due to lack of affect though patient is not noticing significant benefit on the 10 mg dose or the 6.25 mg extended release dose.  We reviewed the plan for [place] to take over the sleep medication today as well as the goal of tapering off so it is only as needed.  Patient was in agreement to working towards this.  For now we will discontinue Ambien and start Lunesta which she had tried in the past.  Risk and benefits of this were reviewed.  We will also start her on Lamictal for mood stabilization and reviewed the risks and benefits.  Potentially as this gets to a more therapeutic dose we can look into simplifying her medication regimen.  The Vyvanse continues to manage by her PCP and patient reports that she is doing pretty good only taking it as  needed on days where her depression is more severe.  Past Psychiatric History: The patient denies any prior psychiatric hospitalizations or suicide attempts.  She has been connected with a couple therapists in the past, though is currently not seeing anyone after her last therapist left the practice in January.   She has tried mirtazapine, Wellbutrin, Prozac, Zoloft, Lexapro (headaches), Celexa, Trintellix, BuSpar, propranolol (hair fall out), Atarax (sleepy), Seroquel (irritability), Zyprexa (oral motor issues), trazodone, Depakote, Adderall, Vyvanse, Ritalin, Klonopin, Xanax, Lunesta, Ambien  Patient currently taking Vyvanse, Trintellix, Ambien, and gabapentin.  She denies any substance use denies other than one alcoholic drink a week.   Past Medical History:  Past Medical History:  Diagnosis Date   Anemia 09/14/2021   Anxiety    Depression    Endometriosis    Fibroid    Frank breech presentation 10/29/2016   GERD (gastroesophageal reflux  disease) 12/10/2014   Gestational diabetes    Migraines    Pregnancy induced hypertension    S/P cesarean section 10/29/2016    Past Surgical History:  Procedure Laterality Date   ADENOIDECTOMY  10/29/2007   BREAST REDUCTION SURGERY     BREAST SURGERY  08/2015   CESAREAN SECTION N/A 10/29/2016   Procedure: CESAREAN SECTION;  Surgeon: Sherian Rein, MD;  Location: [facility];  Service: Obstetrics;  Laterality: N/A;   CESAREAN SECTION N/A 07/25/2021   Procedure: Repeat CESAREAN SECTION;  Surgeon: Noland Fordyce, MD;  Location: [facility] LD ORS;  Service: Obstetrics;  Laterality: N/A;   Repeat C/S BTL   LAPAROSCOPY  12/10/2014   NISSEN FUNDOPLICATION     RHINOPLASTY  03/22/2010   deviated septum   TONSILLECTOMY  2017   TUBAL LIGATION  07/25/2021    Family History:  Family History  Problem Relation Age of Onset   Heart disease Mother    Hypertension Mother    Anxiety disorder Mother    Depression Mother    Alcohol abuse Mother    Arthritis Mother    Hypertension Father    Anxiety disorder Father    Depression Father    Heart attack Father    Heart failure Father    Testicular cancer Brother    Diabetes Maternal Grandmother    Heart attack Maternal Grandfather        x 2    Social History:  Social History   Socioeconomic History   Marital status: Married    Spouse name: Not on file   Number of children: Not on file   Years of education: Not on file   Highest education level: Not on file  Occupational History   Occupation: Homemaker  Tobacco Use   Smoking status: Never   Smokeless tobacco: Never  Vaping Use   Vaping status: Never Used  Substance and Sexual Activity   Alcohol use: Yes    Comment: less than one drink per month   Drug use: No   Sexual activity: Yes    Birth control/protection: Surgical  Other Topics Concern   Not on file  Social History Narrative   Stay at home   2 children -- 30 year old son and 51 year old daughter (2024)   Married    Social Determinants of Health   Financial Resource Strain: Not on file  Food Insecurity: Food Insecurity Present (04/14/2021)   Hunger Vital Sign    Worried About Running Out of Food in the Last Year: Never true    Ran Out of Food in the Last Year: Sometimes  true  Transportation Needs: Not on file  Physical Activity: Not on file  Stress: Not on file  Social Connections: Unknown (04/04/2023)   Received [facility]   Social Network    Social Network: Not on file    Allergies: No Known Allergies  Current Medications: Current Outpatient Medications  Medication Sig Dispense Refill   ARIPiprazole (ABILIFY) 5 MG tablet Take 1 tablet (5 mg total) by mouth daily. 90 tablet 0   ARIPiprazole (ABILIFY) 5 MG tablet Take 1 tablet (5 mg total) by mouth daily. 30 tablet 0   eszopiclone (LUNESTA) 1 MG TABS tablet Take 1 tablet (1 mg total) by mouth at bedtime as needed for sleep. Take immediately before bedtime 30 tablet 1   lamoTRIgine (LAMICTAL) 25 MG tablet Take 1 tablet (25 mg total) by mouth daily for 14 days, THEN 2 tablets (50 mg total) daily for 16 days. 46 tablet 1   amoxicillin (AMOXIL) 500 MG tablet Take 1 tablet (500 mg total) by mouth 2 (two) times daily. Take 2 tablets twice daily for 14 days. 56 tablet 0   clarithromycin (BIAXIN) 500 MG tablet Take 1 tablet (500 mg total) by mouth 2 (two) times daily. 28 tablet 0   [START ON 06/02/2023] clonazePAM (KLONOPIN) 0.5 MG tablet Take 1 tablet (0.5 mg total) by mouth daily as needed for anxiety. 30 tablet 0   dicyclomine (BENTYL) 10 MG capsule Take 1 capsule (10 mg total) by mouth 3 (three) times daily before meals. 90 capsule 2   lisdexamfetamine (VYVANSE) 50 MG capsule Take 1 capsule (50 mg total) by mouth daily. 30 capsule 0   naproxen (NAPROSYN) 500 MG tablet as needed for mild pain.     omeprazole (PRILOSEC) 40 MG capsule Take 1 capsule (40 mg total) by mouth 2 (two) times daily. 60 capsule 2   omeprazole (PRILOSEC) 40 MG capsule  Take 1 capsule (40 mg total) by mouth daily. 90 capsule 3   vortioxetine HBr (TRINTELLIX) 10 MG TABS tablet Take 1 tablet (10 mg total) by mouth daily. 90 tablet 0   No current facility-administered medications for this visit.     Psychiatric Specialty Exam: Review of Systems  Last menstrual period 04/20/2023, not currently breastfeeding.There is no height or weight on file to calculate BMI.  General Appearance: Fairly Groomed  Eye Contact:  Good  Speech:  Clear and Coherent and Pressured  Volume:  Normal  Mood:  Anxious and Euthymic  Affect:  Congruent  Thought Process:  Coherent and Goal Directed  Orientation:  Full (Time, Place, and Person)  Thought Content: Logical   Suicidal Thoughts:  No  Homicidal Thoughts:  No  Memory:  Immediate;   Good  Judgement:  Good  Insight:  Fair  Psychomotor Activity:  Normal  Concentration:  Concentration: Good  Recall:  Good  Fund of Knowledge: Fair  Language: Good  Akathisia:  NA    AIMS (if indicated): not done  Assets:  Communication Skills Desire for Improvement Housing Talents/Skills Transportation  ADL's:  Intact  Cognition: WNL  Sleep:  Fair   Metabolic Disorder Labs: Lab Results  Component Value Date   HGBA1C 5.2 04/20/2023   No results found for: "PROLACTIN" Lab Results  Component Value Date   CHOL 167 04/20/2023   TRIG 99.0 04/20/2023   HDL 56.30 04/20/2023   CHOLHDL 3 04/20/2023   VLDL 19.8 04/20/2023   LDLCALC 91 04/20/2023   Lab Results  Component Value Date   TSH 1.34 03/30/2022  Therapeutic Level Labs: No results found for: "LITHIUM" No results found for: "VALPROATE" No results found for: "CBMZ"   Screenings: GAD-7    Flowsheet Row Office Visit from 04/20/2023 in [facility] Video Visit from 12/01/2022 in [facility] Video Visit from 09/30/2022 in [facility] [facility] Visit from 08/25/2022 in [facility] from 12/28/2021 in [facility]  Total GAD-7 Score 21 20 3 21 20       PHQ2-9    Flowsheet Row Office Visit from 04/20/2023 in [facility] Video Visit from 12/01/2022 in [facility] Office Visit from 08/25/2022 in [facility] from 12/28/2021 in [facility] Nutrition from 04/14/2021 in [facility] Total Score 3 1 6  0 0  PHQ-9 Total Score 15 11 18 6  --      Flowsheet Row ED from 04/15/2023 in [facility] ED from 02/25/2023 in [facility] [employer] Video Visit from 12/01/2022 in [facility]  C-SSRS RISK CATEGORY Low Risk No Risk No Risk       Collaboration of Care: Collaboration of Care: Medication Management AEB medication prescription, Primary Care Provider AEB chart review and communication, Psychiatrist AEB chart review, and Referral or follow-up with counselor/therapist AEB therapy referral  Patient/Guardian was advised Release of Information must be obtained prior to any record release in order to collaborate their care with an outside provider. Patient/Guardian was advised if they have not already done so to contact the registration department to sign all necessary forms in order for [place] to release information regarding their care.   Consent: Patient/Guardian gives verbal consent for treatment and assignment of benefits for services provided during this visit. Patient/Guardian expressed understanding and agreed to proceed.    Stasia Cavalier, MD 05/16/2023, 11:03 AM   Virtual Visit via Video Note  I connected with Morgan Chang on 05/16/23 at  9:30 AM EDT by a video enabled telemedicine application and verified that I am speaking with the correct  person using two identifiers.  Location: Patient: Home Provider: Home Office   I discussed the limitations of evaluation and management by telemedicine and the availability of in person appointments. The patient expressed understanding and agreed to proceed.   I discussed the assessment and treatment plan with the patient. The patient was provided an opportunity to ask questions and all were answered. The patient agreed with the plan and demonstrated an understanding of the instructions.   The patient was advised to call back or seek an in-person evaluation if the symptoms worsen or if the condition fails to improve as anticipated.  I provided 20 minutes of non-face-to-face time during this encounter.   Stasia Cavalier, MD

## 2023-05-16 ENCOUNTER — Telehealth (HOSPITAL_COMMUNITY): Admitting: Psychiatry

## 2023-05-16 ENCOUNTER — Encounter (HOSPITAL_COMMUNITY): Payer: Self-pay | Admitting: Psychiatry

## 2023-05-16 DIAGNOSIS — F430 Acute stress reaction: Secondary | ICD-10-CM

## 2023-05-16 DIAGNOSIS — F3181 Bipolar II disorder: Secondary | ICD-10-CM | POA: Diagnosis not present

## 2023-05-16 DIAGNOSIS — F411 Generalized anxiety disorder: Secondary | ICD-10-CM

## 2023-05-16 DIAGNOSIS — F410 Panic disorder [episodic paroxysmal anxiety] without agoraphobia: Secondary | ICD-10-CM | POA: Diagnosis not present

## 2023-05-16 MED ORDER — ARIPIPRAZOLE 5 MG PO TABS
5.0000 mg | ORAL_TABLET | Freq: Every day | ORAL | 0 refills | Status: DC
Start: 2023-05-16 — End: 2023-06-10

## 2023-05-16 MED ORDER — ARIPIPRAZOLE 5 MG PO TABS
5.0000 mg | ORAL_TABLET | Freq: Every day | ORAL | 0 refills | Status: DC
Start: 2023-05-16 — End: 2023-06-06

## 2023-05-16 MED ORDER — LAMOTRIGINE 25 MG PO TABS
ORAL_TABLET | ORAL | 1 refills | Status: DC
Start: 2023-05-16 — End: 2023-06-10

## 2023-05-16 MED ORDER — CLONAZEPAM 0.5 MG PO TABS
0.5000 mg | ORAL_TABLET | Freq: Every day | ORAL | 0 refills | Status: DC | PRN
Start: 2023-06-02 — End: 2023-06-11

## 2023-05-16 MED ORDER — ESZOPICLONE 1 MG PO TABS
1.0000 mg | ORAL_TABLET | Freq: Every evening | ORAL | 1 refills | Status: DC | PRN
Start: 2023-05-16 — End: 2023-05-23

## 2023-05-16 MED ORDER — VORTIOXETINE HBR 10 MG PO TABS: 10.0000 mg | ORAL_TABLET | Freq: Every day | ORAL | 0 refills | Status: DC

## 2023-05-23 ENCOUNTER — Ambulatory Visit: Admitting: Physician Assistant

## 2023-05-23 ENCOUNTER — Telehealth (HOSPITAL_COMMUNITY): Payer: Self-pay

## 2023-05-23 DIAGNOSIS — F3181 Bipolar II disorder: Secondary | ICD-10-CM

## 2023-05-23 MED ORDER — ZOLPIDEM TARTRATE 10 MG PO TABS
10.0000 mg | ORAL_TABLET | Freq: Every evening | ORAL | 0 refills | Status: DC | PRN
Start: 2023-05-23 — End: 2023-06-15

## 2023-05-23 NOTE — Telephone Encounter (Signed)
Attempted to call the patient x2 with no answer. A HIPAA compliant voicemail was left and was patient was instructed to call back.  Will send in a 30 day script of Ambien 10 mg and discontinue Lunesta today.

## 2023-05-23 NOTE — Telephone Encounter (Signed)
Patient is calling to report that the Alfonso Patten is not working at all and she would like to go back to the Ambien. Please review and advise, thank you

## 2023-05-27 ENCOUNTER — Other Ambulatory Visit: Payer: Self-pay | Admitting: Physician Assistant

## 2023-05-30 ENCOUNTER — Other Ambulatory Visit (HOSPITAL_COMMUNITY): Payer: Self-pay

## 2023-05-30 MED ORDER — LISDEXAMFETAMINE DIMESYLATE 50 MG PO CAPS
50.0000 mg | ORAL_CAPSULE | Freq: Every day | ORAL | 0 refills | Status: DC
  Filled 2023-05-30: qty 30, 30d supply, fill #0

## 2023-05-30 NOTE — Telephone Encounter (Signed)
Pt requesting refill for Vyvanse 50 mg capsule. Last OV 04/20/2023.

## 2023-06-02 ENCOUNTER — Other Ambulatory Visit (HOSPITAL_COMMUNITY): Payer: Self-pay

## 2023-06-05 IMAGING — MR MR HEAD W/O CM
9 of 10 series · 36 of 48 positions shown · non-contrast
Comparison: No pertinent prior exam.

CLINICAL DATA: Headache.  Dizziness.

EXAM:
MRI HEAD WITHOUT CONTRAST
MRV HEAD WITHOUT CONTRAST
TECHNIQUE: Multiplanar, multi-echo pulse sequences of the brain and surrounding
structures were acquired without intravenous contrast. Angiographic
images of the intracranial venous structures were acquired using MRV
technique without intravenous contrast.

[Series 3: DWI · axial · 3.0mm · 1.09mm/px · z∈[-123,+9]mm · 8 of 94 slices shown (1 of 4)]
[im 1/94]
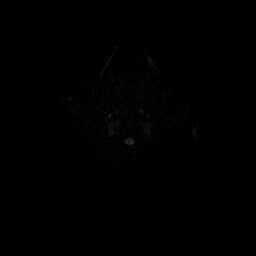
[im 11/94]
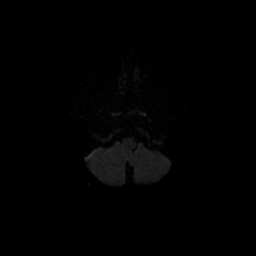
[im 32/94]
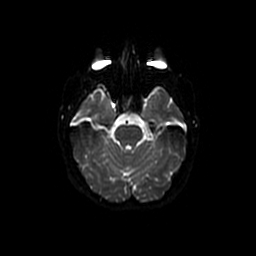
[im 42/94]
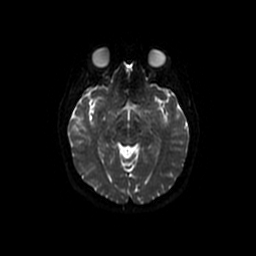
[im 52/94]
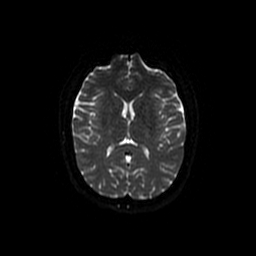
[im 63/94]
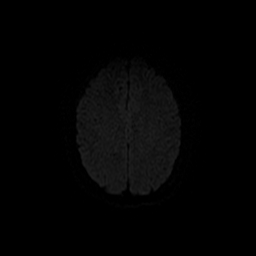
[im 83/94]
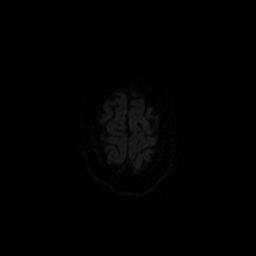
[im 94/94]
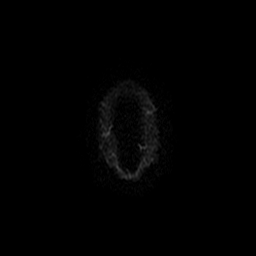

[Series 4: DWI · coronal · 5.0mm · 1.09mm/px · 7 of 72 slices shown (2 of 4)]
[im 1/72]
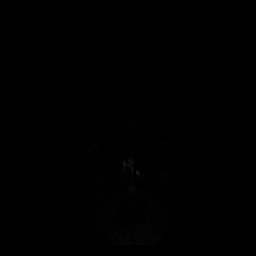
[im 12/72]
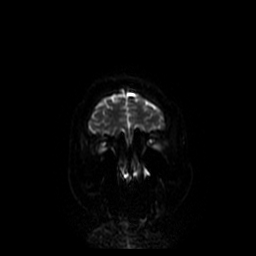
[im 24/72]
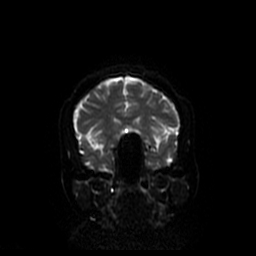
[im 36/72]
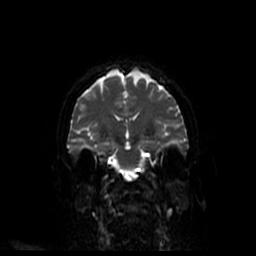
[im 48/72]
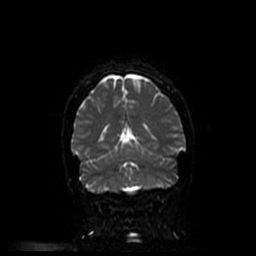
[im 60/72]
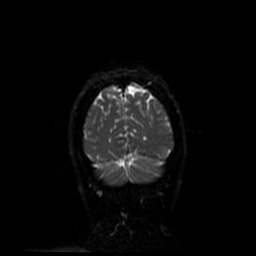
[im 72/72]
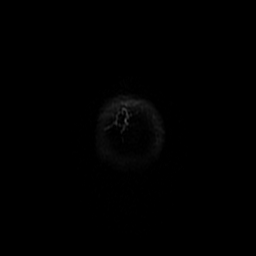

[Series 9: T1 · sagittal · 5.0mm · 0.47mm/px · 3 of 25 slices shown (1 of 2)]
[im 1/25]
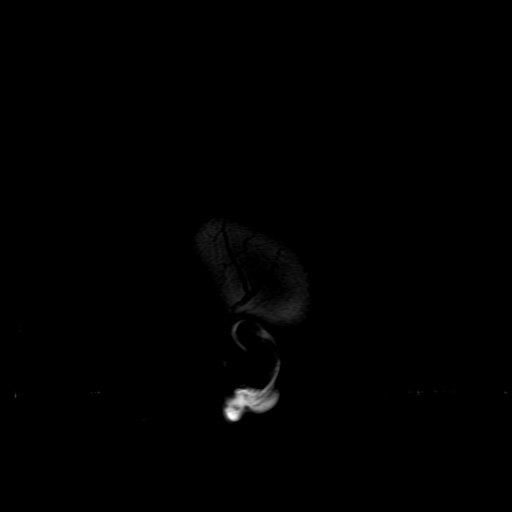
[im 13/25]
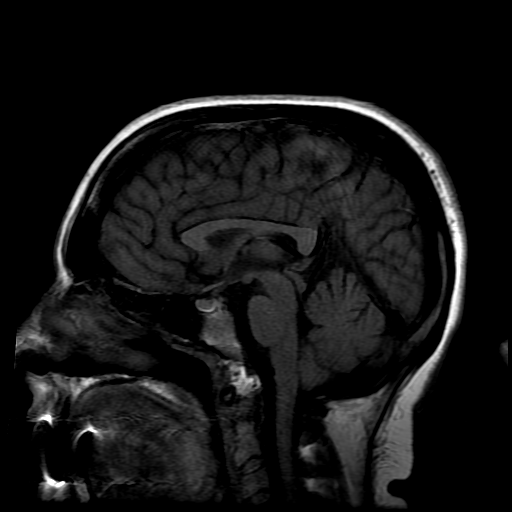
[im 25/25]
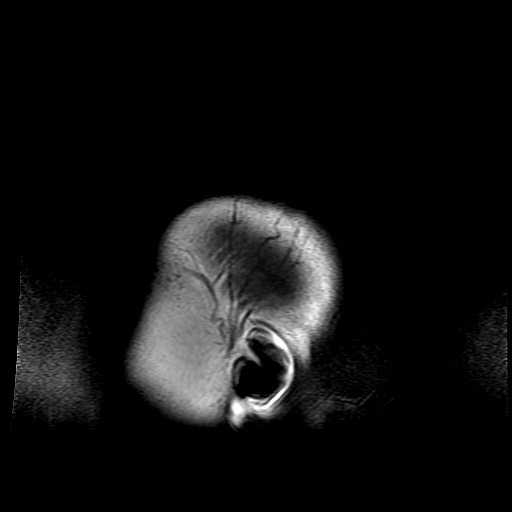

[Series 10: T2 · axial · 5.0mm · 0.43mm/px · z∈[-88,+48]mm · 2 of 24 slices shown (1 of 2)]
[im 1/24]
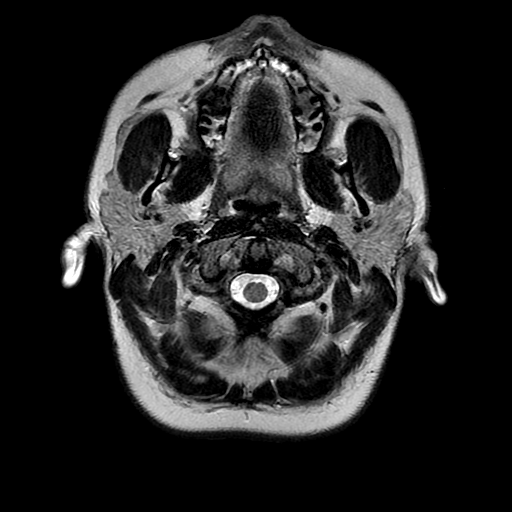
[im 24/24]
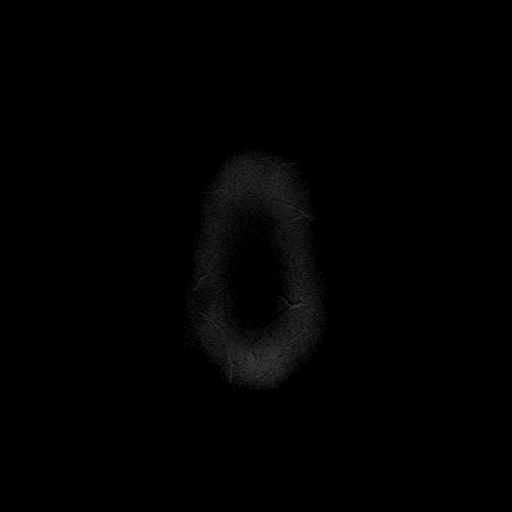

[Series 11: FLAIR · axial · 3.0mm · 0.43mm/px · z∈[-88,+48]mm · 2 of 24 slices shown]
[im 1/24]
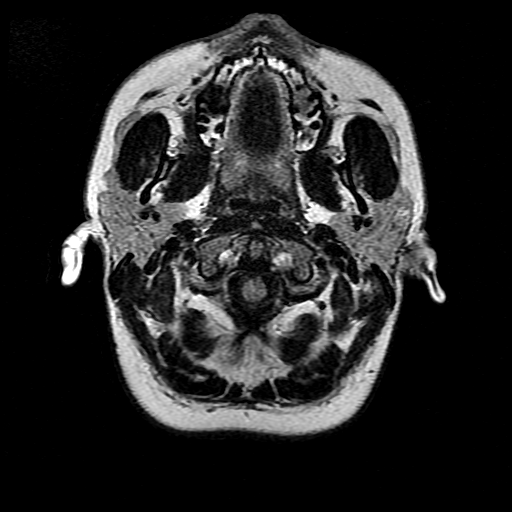
[im 24/24]
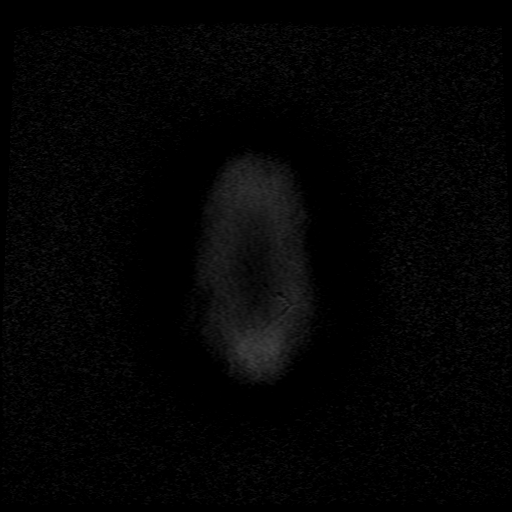

[Series 13: T1 · axial · 3.0mm · 0.47mm/px · z∈[-92,-77]mm · 2 of 96 slices shown (2 of 2)]
[im 1/96]
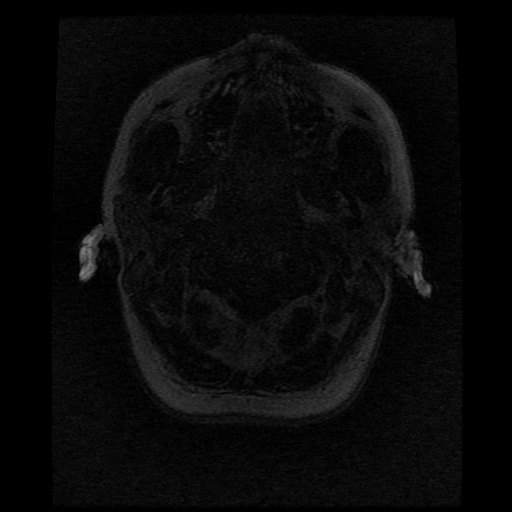
[im 11/96]
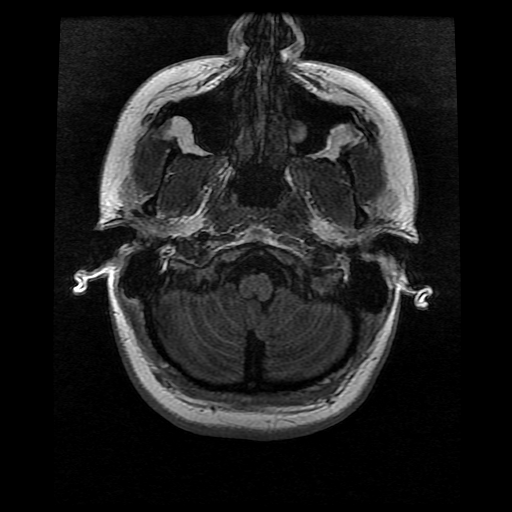

[Series 14: T2 · coronal · 5.0mm · 0.39mm/px · 3 of 28 slices shown (2 of 2)]
[im 1/28]
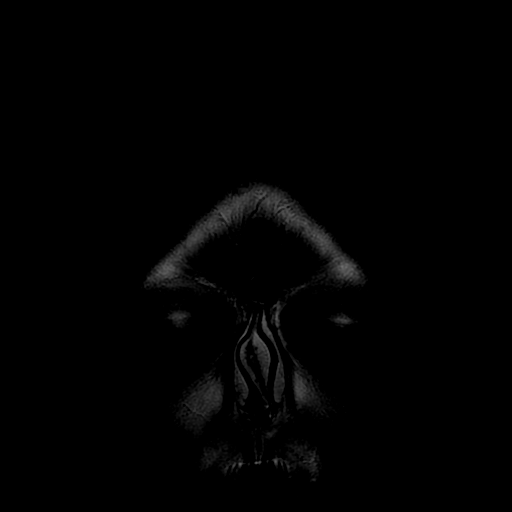
[im 14/28]
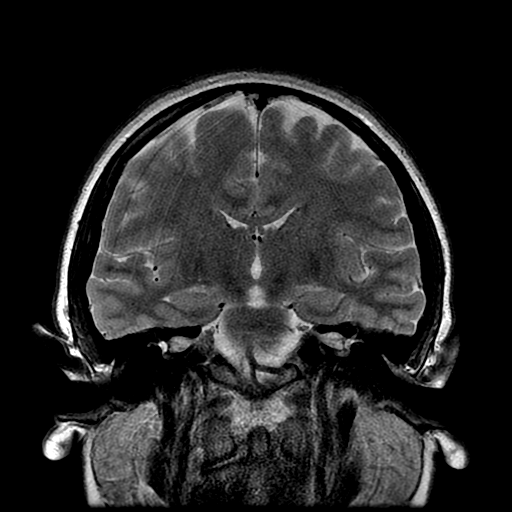
[im 28/28]
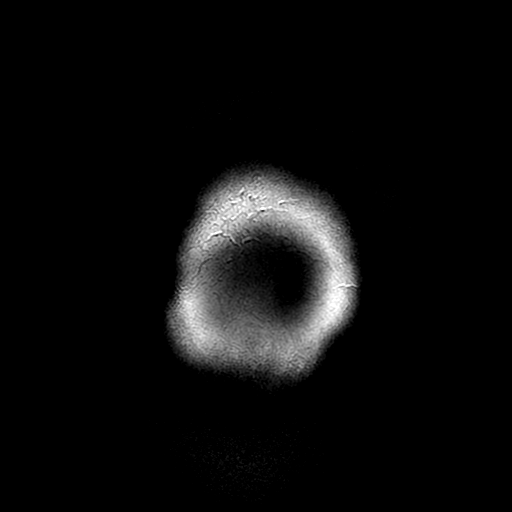

[Series 300: DWI · axial · 3.0mm · 1.09mm/px · z∈[-123,+9]mm · 5 of 47 slices shown (3 of 4)]
[im 1/47]
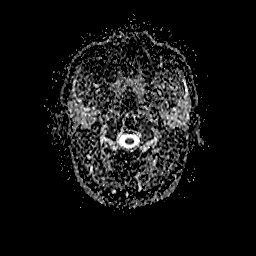
[im 12/47]
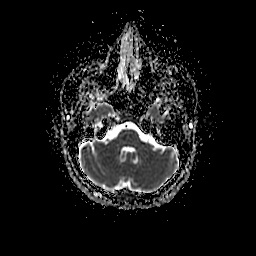
[im 24/47]
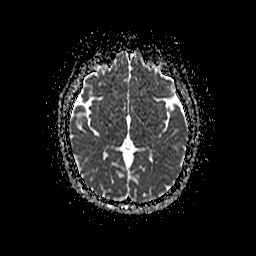
[im 35/47]
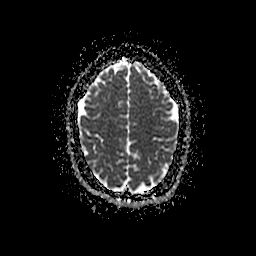
[im 47/47]
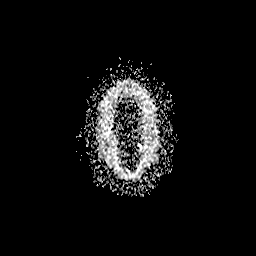

[Series 400: DWI · coronal · 5.0mm · 1.09mm/px · 4 of 36 slices shown (4 of 4)]
[im 1/36]
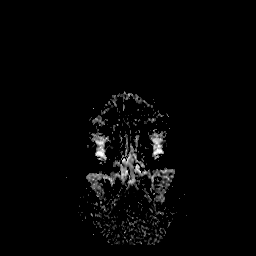
[im 12/36]
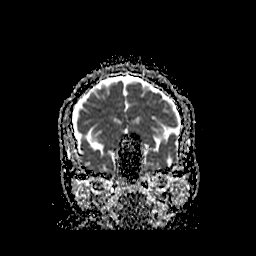
[im 24/36]
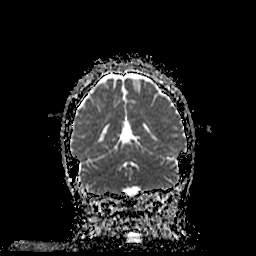
[im 36/36]
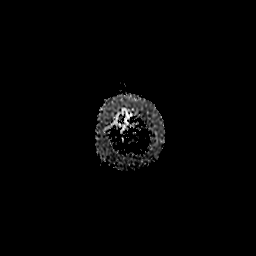

[36 of 48 positions shown; findings below may reference images not displayed]

FINDINGS: MRI HEAD WITHOUT CONTRAST

Brain: No acute infarction, hemorrhage, hydrocephalus, extra-axial
collection or mass lesion. Normal position of the cerebellar
tonsils. Unremarkable sella.

Vascular: Major arterial flow voids are maintained at the skull
base.

Skull and upper cervical spine: Normal marrow signal.

Sinuses/Orbits: Two retention cysts in the left maxillary sinus.
Otherwise, sinuses are largely clear. Unremarkable orbits.

Other: No mastoid effusions.

MR VENOGRAM WITHOUT CONTRAST

Motion limited MRV without convincing evidence of dural venous sinus
thrombosis. Linear loss of flow related signal involving the
bilateral sigmoid sinuses and jugular bulbs is favored to be flow
related/artifactual.
IMPRESSION: MRI:

No evidence of acute intracranial abnormality.

MRV:

Motion limited evaluation without convincing evidence of dural
venous sinus thrombosis.

## 2023-06-06 ENCOUNTER — Inpatient Hospital Stay
Admission: AD | Admit: 2023-06-06 | Discharge: 2023-06-10 | DRG: 885 | Disposition: A | Discharge status: Home / Self Care | Source: Intra-hospital | Attending: Psychiatry | Admitting: Psychiatry

## 2023-06-06 ENCOUNTER — Emergency Department
Admission: EM | Admit: 2023-06-06 | Discharge: 2023-06-06 | Disposition: A | Discharge status: Other Acute Inpatient Hospital | Attending: Emergency Medicine | Admitting: Emergency Medicine

## 2023-06-06 ENCOUNTER — Other Ambulatory Visit: Payer: Self-pay

## 2023-06-06 DIAGNOSIS — R45851 Suicidal ideations: Secondary | ICD-10-CM

## 2023-06-06 DIAGNOSIS — F411 Generalized anxiety disorder: Secondary | ICD-10-CM | POA: Diagnosis not present

## 2023-06-06 DIAGNOSIS — F1220 Cannabis dependence, uncomplicated: Secondary | ICD-10-CM | POA: Diagnosis not present

## 2023-06-06 DIAGNOSIS — F419 Anxiety disorder, unspecified: Secondary | ICD-10-CM | POA: Diagnosis present

## 2023-06-06 DIAGNOSIS — F909 Attention-deficit hyperactivity disorder, unspecified type: Secondary | ICD-10-CM | POA: Diagnosis present

## 2023-06-06 DIAGNOSIS — F3181 Bipolar II disorder: Secondary | ICD-10-CM | POA: Diagnosis present

## 2023-06-06 DIAGNOSIS — F902 Attention-deficit hyperactivity disorder, combined type: Secondary | ICD-10-CM | POA: Diagnosis present

## 2023-06-06 DIAGNOSIS — Z79899 Other long term (current) drug therapy: Secondary | ICD-10-CM

## 2023-06-06 DIAGNOSIS — G4700 Insomnia, unspecified: Secondary | ICD-10-CM | POA: Insufficient documentation

## 2023-06-06 DIAGNOSIS — Z818 Family history of other mental and behavioral disorders: Secondary | ICD-10-CM | POA: Diagnosis not present

## 2023-06-06 DIAGNOSIS — Z8249 Family history of ischemic heart disease and other diseases of the circulatory system: Secondary | ICD-10-CM | POA: Diagnosis not present

## 2023-06-06 LAB — COMPREHENSIVE METABOLIC PANEL
ALT: 14 U/L (ref 0–44)
AST: 14 U/L — ABNORMAL LOW (ref 15–41)
Albumin: 4.4 g/dL (ref 3.5–5.0)
Alkaline Phosphatase: 45 U/L (ref 38–126)
Anion gap: 10 (ref 5–15)
BUN: 18 mg/dL (ref 6–20)
CO2: 22 mmol/L (ref 22–32)
Calcium: 9 mg/dL (ref 8.9–10.3)
Chloride: 106 mmol/L (ref 98–111)
Creatinine, Ser: 0.91 mg/dL (ref 0.44–1.00)
GFR, Estimated: >60 mL/min (ref >60)
Glucose, Bld: 108 mg/dL — ABNORMAL HIGH (ref 70–99)
Potassium: 3.9 mmol/L (ref 3.5–5.1)
Sodium: 138 mmol/L (ref 135–145)
Total Bilirubin: 0.9 mg/dL (ref 0.3–1.2)
Total Protein: 7.7 g/dL (ref 6.5–8.1)

## 2023-06-06 LAB — URINE DRUG SCREEN, QUALITATIVE (ARMC ONLY)
Amphetamines, Ur Screen: POSITIVE — AB
Barbiturates, Ur Screen: NOT DETECTED
Benzodiazepine, Ur Scrn: NOT DETECTED
Cannabinoid 50 Ng, Ur ~~LOC~~: POSITIVE — AB
Cocaine Metabolite,Ur ~~LOC~~: NOT DETECTED
MDMA (Ecstasy)Ur Screen: NOT DETECTED
Methadone Scn, Ur: NOT DETECTED
Opiate, Ur Screen: NOT DETECTED
Phencyclidine (PCP) Ur S: NOT DETECTED
Tricyclic, Ur Screen: NOT DETECTED

## 2023-06-06 LAB — CBC
HCT: 40.4 % (ref 36.0–46.0)
Hemoglobin: 13.7 g/dL (ref 12.0–15.0)
MCH: 31 pg (ref 26.0–34.0)
MCHC: 33.9 g/dL (ref 30.0–36.0)
MCV: 91.4 fL (ref 80.0–100.0)
Platelets: 289 10*3/uL (ref 150–400)
RBC: 4.42 MIL/uL (ref 3.87–5.11)
RDW: 11.7 % (ref 11.5–15.5)
WBC: 5.7 10*3/uL (ref 4.0–10.5)
nRBC: 0 % (ref 0.0–0.2)

## 2023-06-06 LAB — PREGNANCY, URINE: Preg Test, Ur: NEGATIVE

## 2023-06-06 MED ORDER — ALUM & MAG HYDROXIDE-SIMETH 200-200-20 MG/5ML PO SUSP: 30.0000 mL | ORAL | Status: DC | PRN

## 2023-06-06 MED ORDER — ACETAMINOPHEN 325 MG PO TABS: 650.0000 mg | ORAL_TABLET | Freq: Four times a day (QID) | ORAL | Status: DC | PRN

## 2023-06-06 MED ORDER — LAMOTRIGINE 25 MG PO TABS
50.0000 mg | ORAL_TABLET | Freq: Every day | ORAL | Status: DC
  Administered 2023-06-07 – 2023-06-10 (×4): 50 mg via ORAL
  Filled 2023-06-06 (×4): qty 2

## 2023-06-06 MED ORDER — VORTIOXETINE HBR 5 MG PO TABS
5.0000 mg | ORAL_TABLET | Freq: Every day | ORAL | Status: DC
  Administered 2023-06-06: 5 mg via ORAL
  Filled 2023-06-06: qty 1

## 2023-06-06 MED ORDER — LORAZEPAM 2 MG/ML IJ SOLN: 2.0000 mg | Freq: Three times a day (TID) | INTRAMUSCULAR | Status: DC | PRN

## 2023-06-06 MED ORDER — HALOPERIDOL LACTATE 5 MG/ML IJ SOLN: 5.0000 mg | Freq: Three times a day (TID) | INTRAMUSCULAR | Status: DC | PRN

## 2023-06-06 MED ORDER — LISDEXAMFETAMINE DIMESYLATE 30 MG PO CAPS
30.0000 mg | ORAL_CAPSULE | Freq: Every day | ORAL | Status: DC
  Administered 2023-06-07: 30 mg via ORAL
  Filled 2023-06-06: qty 1

## 2023-06-06 MED ORDER — DIPHENHYDRAMINE HCL 25 MG PO CAPS: 50.0000 mg | ORAL_CAPSULE | Freq: Three times a day (TID) | ORAL | Status: DC | PRN

## 2023-06-06 MED ORDER — ARIPIPRAZOLE 10 MG PO TABS
10.0000 mg | ORAL_TABLET | Freq: Every day | ORAL | Status: DC
  Administered 2023-06-07 – 2023-06-10 (×4): 10 mg via ORAL
  Filled 2023-06-06 (×4): qty 1

## 2023-06-06 MED ORDER — ARIPIPRAZOLE 10 MG PO TABS: 10.0000 mg | ORAL_TABLET | Freq: Every day | ORAL | Status: DC

## 2023-06-06 MED ORDER — ZOLPIDEM TARTRATE 5 MG PO TABS
5.0000 mg | ORAL_TABLET | Freq: Every evening | ORAL | Status: DC | PRN
  Administered 2023-06-06 – 2023-06-08 (×3): 5 mg via ORAL
  Filled 2023-06-06 (×3): qty 1

## 2023-06-06 MED ORDER — LORAZEPAM 1 MG PO TABS
1.0000 mg | ORAL_TABLET | Freq: Once | ORAL | Status: AC
  Administered 2023-06-06: 1 mg via ORAL
  Filled 2023-06-06: qty 1

## 2023-06-06 MED ORDER — LORAZEPAM 2 MG PO TABS: 2.0000 mg | ORAL_TABLET | Freq: Three times a day (TID) | ORAL | Status: DC | PRN

## 2023-06-06 MED ORDER — LISDEXAMFETAMINE DIMESYLATE 20 MG PO CAPS
20.0000 mg | ORAL_CAPSULE | Freq: Every day | ORAL | Status: DC
  Administered 2023-06-07: 20 mg via ORAL
  Filled 2023-06-06: qty 1

## 2023-06-06 MED ORDER — ZOLPIDEM TARTRATE 5 MG PO TABS: 5.0000 mg | ORAL_TABLET | Freq: Every evening | ORAL | Status: DC | PRN

## 2023-06-06 MED ORDER — LISDEXAMFETAMINE DIMESYLATE 50 MG PO CAPS: 50.0000 mg | ORAL_CAPSULE | Freq: Every day | ORAL | Status: DC

## 2023-06-06 MED ORDER — CLONAZEPAM 0.5 MG PO TABS
1.0000 mg | ORAL_TABLET | Freq: Once | ORAL | Status: AC
  Administered 2023-06-06: 1 mg via ORAL
  Filled 2023-06-06: qty 2

## 2023-06-06 MED ORDER — LAMOTRIGINE 25 MG PO TABS: 50.0000 mg | ORAL_TABLET | Freq: Every day | ORAL | Status: DC

## 2023-06-06 MED ORDER — VORTIOXETINE HBR 5 MG PO TABS
5.0000 mg | ORAL_TABLET | Freq: Every day | ORAL | Status: DC
  Administered 2023-06-07 – 2023-06-10 (×4): 5 mg via ORAL
  Filled 2023-06-06 (×4): qty 1

## 2023-06-06 MED ORDER — MAGNESIUM HYDROXIDE 400 MG/5ML PO SUSP: 15.0000 mL | Freq: Every day | ORAL | Status: DC | PRN

## 2023-06-06 MED ORDER — LISDEXAMFETAMINE DIMESYLATE 20 MG PO CAPS: 20.0000 mg | ORAL_CAPSULE | Freq: Every day | ORAL | Status: DC

## 2023-06-06 MED ORDER — HALOPERIDOL 5 MG PO TABS: 5.0000 mg | ORAL_TABLET | Freq: Three times a day (TID) | ORAL | Status: DC | PRN

## 2023-06-06 MED ORDER — DIPHENHYDRAMINE HCL 50 MG/ML IJ SOLN: 50.0000 mg | Freq: Three times a day (TID) | INTRAMUSCULAR | Status: DC | PRN

## 2023-06-06 MED ORDER — HYDROXYZINE HCL 50 MG PO TABS
50.0000 mg | ORAL_TABLET | Freq: Three times a day (TID) | ORAL | Status: DC | PRN
  Administered 2023-06-07 – 2023-06-09 (×3): 50 mg via ORAL
  Filled 2023-06-06 (×4): qty 1

## 2023-06-06 MED ORDER — LISDEXAMFETAMINE DIMESYLATE 30 MG PO CAPS: 30.0000 mg | ORAL_CAPSULE | Freq: Every day | ORAL | Status: DC

## 2023-06-06 NOTE — ED Notes (Signed)
Curtain provided around pt for privacy

## 2023-06-06 NOTE — ED Notes (Signed)
VOL  CONSULT  DONE  PENDING  PLACEMENT

## 2023-06-06 NOTE — ED Notes (Signed)
Pt given PM snack.

## 2023-06-06 NOTE — ED Notes (Signed)
VOL/  PENDING  CONSULT

## 2023-06-06 NOTE — ED Notes (Signed)
Mother here for supervised 15 mins visit.

## 2023-06-06 NOTE — BH Assessment (Signed)
Comprehensive Clinical Assessment (CCA) Note  06/06/2023 Morgan Chang [id number]  Morgan Chang, 27 year old female who presents to [facility] ED voluntarily for treatment. Per triage note, Pt here with SI for a few months but getting stronger over the past 2 days. Pt started a new medication in July, Abilify and Lamictal. Pt states she believes the medication is helping but is having SI out of the blue. Pt denies any pain.   During TTS assessment pt presents alert and oriented x 4, restless but cooperative, and mood-congruent with affect. The pt does not appear to be responding to internal or external stimuli. Neither is the pt presenting with any delusional thinking. Pt verified the information provided to triage RN.   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 She reports the job has been stressful and overwhelming with the opening of a new store which led to the worsening of her depressive symptoms over the past couple of days. Patient reports feelings of hopelessness, worthlessness, irritability, and crying spells. Patient states she has an upcoming appointment with her provider, Morgan Chang in 2  weeks who prescribes her medication. Patient reports she is compliant with her meds and takes them as prescribed. Patient denies using any illicit substances; however, her UDS is positive for marijuana and amphetamines. Patient reports no prior attempts to end her life. Pt reports no INPT hx. Pt reports family hx of MH with her mom, maternal grandmother, aunts and cousin. Pt denies current SI/HI/AH/VH; stating her mood has changed dramatically since given Klonopin here in the ED. Pt contracts for safety. Pt provided her husband Morgan Chang as a collateral contact.   Psych Team spoke to Morgan Chang [phone]. Per Christal, NP: Collateral: The psychiatry team spoke with the patient's husband, Morgan Chang [phone]) who says  that her attitude is mean, she threatens to harm herself, and that she spirals out of control. He says that she hates everyone, her sleep is poor, she has difficulty falling asleep and staying asleep. He reports a past suicide attempt greater than five years ago where she took pills, he thinks Tylenol, and she had to be sent to the ER. He does not remember if she had to be admitted to the hospital for the incident. Mr. Osterkamp reports she is consistent with her meds, although there are times when they are delayed in being delivered via mail. He says that she consumes marijuana daily; Either she smokes it or she consumes edible; Last used yesterday. He reports she occasionally uses alcohol use. Mr. Shvarts reports safety concerns and says the only reason she came to the hospital today was because he gave her an ultimatum.    Per Christal, NP, pt is recommended for inpatient psychiatric admission.    Chief Complaint:  Chief Complaint  Patient presents with   Suicidal   Visit Diagnosis: Bipolar 2 disorder, major depressive episode    CCA Screening, Triage and Referral (STR)  Patient Reported Information How did you hear about [place]? Self  Referral name: No data recorded Referral phone number: No data recorded  Whom do you see for routine medical problems? No data recorded Practice/Facility Name: No data recorded Practice/Facility Phone Number: No data recorded Name of Contact: No data recorded Contact Number: No data recorded Contact Fax Number: No data recorded Prescriber Name: No data recorded Prescriber Address (if known): No data recorded  What Is the Reason for  Your Visit/Call Today? Patient reports she came to the ED due to worsening depressive symptoms.  How Long Has This Been Causing You Problems? > than 6 months  What Do You Feel Would Help You the Most Today? Treatment for Depression or other mood problem; Medication(s); Stress Management   Have You Recently Been in Any  Inpatient Treatment (Hospital/Detox/Crisis Center/28-Day Program)? No data recorded Name/Location of Program/Hospital:No data recorded How Long Were You There? No data recorded When Were You Discharged? No data recorded  Have You Ever Received Services From [facility] Before? No data recorded Who Do You See at [facility]? No data recorded  Have You Recently Had Any Thoughts About Hurting Yourself? Yes  Are You Planning to Commit Suicide/Harm Yourself At This time? No   Have you Recently Had Thoughts About Hurting Someone Morgan Chang? No  Explanation: No data recorded  Have You Used Any Alcohol or Drugs in the Past 24 Hours? Yes  How Long Ago Did You Use Drugs or Alcohol? No data recorded What Did You Use and How Much? Marijuana   Do You Currently Have a Therapist/Psychiatrist? No data recorded Name of Therapist/Psychiatrist: Everlena Chang   Have You Been Recently Discharged From Any Office Practice or Programs? No data recorded Explanation of Discharge From Practice/Program: No data recorded    CCA Screening Triage Referral Assessment Type of Contact: Face-to-Face  Is this Initial or Reassessment? No data recorded Date Telepsych consult ordered in [facility]:  No data recorded Time Telepsych consult ordered in [facility]:  No data recorded  Patient Reported Information Reviewed? No data recorded Patient Left Without Being Seen? No data recorded Reason for Not Completing Assessment: No data recorded  Collateral Involvement: Morgan Chang- husband [phone]   Does Patient Have a Automotive engineer Guardian? No data recorded Name and Contact of Legal Guardian: No data recorded If Minor and Not Living with Parent(s), Who has Custody? No data recorded Is CPS involved or ever been involved? Never  Is APS involved or ever been involved? Never   Patient Determined To Be At Risk for Harm To Self or Others Based on Review of Patient Reported Information or Presenting Complaint?  No  Method: No Plan  Availability of Means: Has close by  Intent: Vague intent or NA  Notification Required: No need or identified person  Additional Information for Danger to Others Potential: No data recorded Additional Comments for Danger to Others Potential: No data recorded Are There Guns or Other Weapons in Your Home? Yes  Types of Guns/Weapons: No data recorded Are These Weapons Safely Secured?                            Yes  Who Could Verify You Are Able To Have These Secured: Husband  Do You Have any Outstanding Charges, Pending Court Dates, Parole/Probation? No data recorded Contacted To Inform of Risk of Harm To Self or Others: No data recorded  Location of Assessment: [facility] ED   Does Patient Present under Involuntary Commitment? No  IVC Papers Initial File Date: No data recorded  [place] of Residence: [place]   Patient Currently Receiving the Following Services: Medication Management; Individual Therapy   Determination of Need: Emergent (2 hours)   Options For Referral: ED Visit; Inpatient Hospitalization; Medication Management; Outpatient Therapy     CCA Biopsychosocial Intake/Chief Complaint:  No data recorded Current Symptoms/Problems: No data recorded  Patient Reported Schizophrenia/Schizoaffective Diagnosis in Past: No  Strengths: Patient able to communicate and verbalize needs.  Preferences: No data recorded Abilities: No data recorded  Type of Services Patient Feels are Needed: No data recorded  Initial Clinical Notes/Concerns: No data recorded  Mental Health Symptoms Depression:   Difficulty Concentrating; Change in energy/activity; Increase/decrease in appetite; Irritability; Hopelessness; Worthlessness; Tearfulness; Sleep (too much or little)   Duration of Depressive symptoms:  Greater than two weeks   Mania:   N/A   Anxiety:    Difficulty concentrating; Irritability; Sleep; Tension; Worrying   Psychosis:   None    Duration of Psychotic symptoms: No data recorded  Trauma:   N/A   Obsessions:   N/A   Compulsions:   N/A   Inattention:   N/A   Hyperactivity/Impulsivity:   N/A   Oppositional/Defiant Behaviors:   N/A   Emotional Irregularity:   Potentially harmful impulsivity; Recurrent suicidal behaviors/gestures/threats   Other Mood/Personality Symptoms:  No data recorded   Mental Status Exam Appearance and self-care  Stature:   Average   Weight:   Average weight   Clothing:   Casual   Grooming:   Normal   Cosmetic use:   None   Posture/gait:   Normal   Motor activity:   Not Remarkable   Sensorium  Attention:   Normal   Concentration:   Anxiety interferes   Orientation:   X5   Recall/memory:   Normal   Affect and Mood  Affect:   Anxious; Flat; Depressed   Mood:   Anxious; Depressed; Hopeless; Worthless   Relating  Eye contact:   Normal   Facial expression:   Anxious   Attitude toward examiner:   Cooperative   Thought and Language  Speech flow:  Clear and Coherent   Thought content:   Appropriate to Mood and Circumstances   Preoccupation:   None   Hallucinations:   None   Organization:  No data recorded  [employer] of Knowledge:   Average   Intelligence:   Average   Abstraction:   Normal   Judgement:   Impaired   Reality Testing:   Realistic   Insight:   Fair   Decision Making:   Normal   Social Functioning  Social Maturity:   Responsible   Social Judgement:   Heedless   Stress  Stressors:   Work; Relationship; Transitions   Coping Ability:   Human resources officer Deficits:   Communication   Supports:   Family     Religion:    Leisure/Recreation:    Exercise/Diet: Exercise/Diet Do You Have Any Trouble Sleeping?: Yes Explanation of Sleeping Difficulties: Patient reports she has not been able to sleep.   CCA Employment/Education Employment/Work Situation: Employment /  Work Situation Employment Situation: Employed Work Stressors: Long shifts Patient's Job has Been Impacted by Current Illness: Yes  Education: Education Is Patient Currently Attending School?: No   CCA Family/Childhood History Family and Relationship History: Family history Marital status: Married Does patient have children?: Yes How many children?: 2  Childhood History:     Child/Adolescent Assessment:     CCA Substance Use Alcohol/Drug Use: Alcohol / Drug Use Pain Medications: See PTA Prescriptions: See PTA Over the Counter: See PTA History of alcohol / drug use?: Yes Longest period of sobriety (when/how long): Unknown Negative Consequences of Use: Personal relationships Substance #1 Name of Substance 1: Marijuana 1 - Frequency: daily  ASAM's:  Six Dimensions of Multidimensional Assessment  Dimension 1:  Acute Intoxication and/or Withdrawal Potential:      Dimension 2:  Biomedical Conditions and Complications:      Dimension 3:  Emotional, Behavioral, or Cognitive Conditions and Complications:     Dimension 4:  Readiness to Change:     Dimension 5:  Relapse, Continued use, or Continued Problem Potential:     Dimension 6:  Recovery/Living Environment:     ASAM Severity Score:    ASAM Recommended Level of Treatment:     Substance use Disorder (SUD) Substance Use Disorder (SUD)  Checklist Symptoms of Substance Use: Continued use despite having a persistent/recurrent physical/psychological problem caused/exacerbated by use, Continued use despite persistent or recurrent social, interpersonal problems, caused or exacerbated by use  Recommendations for Services/Supports/Treatments: Recommendations for Services/Supports/Treatments Recommendations For Services/Supports/Treatments: Inpatient Hospitalization, Medication Management, Individual Therapy  DSM5 Diagnoses: Patient Active Problem List   Diagnosis Date Noted   Bipolar 2  disorder, major depressive episode (HCC) 06/06/2023   Insomnia 06/06/2023   Suicidal ideation 06/06/2023   Marijuana dependence (HCC) 06/06/2023   Symptomatic mammary hypertrophy 09/06/2022   Panic attack as reaction to stress 04/08/2022   Panniculitis 12/13/2021   History of gestational hypertension 09/14/2021   Anemia 09/14/2021   Attention deficit hyperactivity disorder (ADHD), 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   Gestational diabetes mellitus (GDM), antepartum 04/14/2021   Generalized anxiety disorder 03/11/2020   Moderate episode of recurrent major depressive disorder (HCC) 03/11/2020   Sleep difficulties 03/11/2020   Mass of right breast 08/13/2018   Pain of breast 08/13/2018   External hemorrhoids 07/31/2016   Encephalopathy 05/15/2015   GERD (gastroesophageal reflux disease) 12/10/2014    Patient Centered Plan: Patient is on the following Treatment Plan(s):  Anxiety and Depression   Referrals to Alternative Service(s): Referred to Alternative Service(s):   Place:   Date:   Time:    Referred to Alternative Service(s):   Place:   Date:   Time:    Referred to Alternative Service(s):   Place:   Date:   Time:    Referred to Alternative Service(s):   Place:   Date:   Time:      @[facility] @  Madlyn Crosby R Theatre manager, Counselor, LCAS-A

## 2023-06-06 NOTE — ED Provider Notes (Signed)
-----------------------------------------   4:07 PM on 06/06/2023 ----------------------------------------- Patient has been seen by psychiatry they are recommending inpatient admission.  Patient remains here voluntarily.   Minna Antis, MD 06/06/23 [phone]

## 2023-06-06 NOTE — ED Notes (Signed)
Pt using the phone at this time, pt tearful today.

## 2023-06-06 NOTE — ED Notes (Addendum)
Pt belongings:  1 black hoodie 1 pair of black leggings 1 pair of brown sandals 1 beige headband 1 pair of black underwear  1 brown purse (with cell phone) 1 pink water bottle

## 2023-06-06 NOTE — ED Notes (Signed)
Dinner tray given at this time.

## 2023-06-06 NOTE — Consult Note (Addendum)
[facility] Face-to-Face Psychiatry Consult   Reason for Consult:  Psychiatric Evaluation Referring Physician:  Shaune Pollack, MD Patient Identification: Morgan Chang MRN:  [id number] Principal Diagnosis: Bipolar 2 disorder, major depressive episode (HCC) Diagnosis:  Principal Problem:   Bipolar 2 disorder, major depressive episode (HCC) Active Problems:   Generalized anxiety disorder   Attention deficit hyperactivity disorder (ADHD), combined type   Insomnia   Suicidal ideation   Marijuana dependence (HCC)   Total Time spent with patient: 1 hour  Subjective:   Morgan Chang is a 27 y.o. female patient with a history of bipolar type 2, anxiety, depression, and ADHD, presented to the emergency department voluntarily with complaints of escalating depressive symptoms over the past few weeks, including trouble sleeping and suicidal thoughts. Her husband reportedly urged her to seek help in the emergency department.    HPI:  Patient seen face to face by this provider, consulted with attending psychiatry provider [facility] emergency department physician K. Paduchowski; and chart reviewed on 06/06/23. On evaluation, Morgan Chang reports experiencing increased stress due to starting a new job and feeling overwhelmed, which she believes has contributed to the decline in her mental health. She denies active suicidal ideations, however, she states that she experiences occasional thoughts of being better off gone, but with no plans or intent. She reports depressive symptoms including feelings of worthlessness, hopelessness, irritability, and crying spells. She experiences difficulty falling asleep and wake up early in the morning. She states her appetite has recently improved. She denies any self-injurious behaviors, or suicide attempts. Similarly she denies homicidal ideations, paranoia or delusional thought content. She states that she has experienced auditory hallucinations in the past, but none since starting  Abilify in early July.   Morgan Chang reports a history of bipolar type 2, anxiety, depression, and ADHD. She states that she is currently under the care of an outpatient psychiatry provider, Everlena Cooper, with [facility], and is scheduled for an appointment in two and a half weeks. She states she is also due to start counseling with [facility] at the end of September. [name] states she is currently prescribe Abilify, Lamotrigine, and Trintellix, and reports being consistent with these medications. She also states that she takes Vyvanse for ADHD as needed, with the last dose taken two days ago. She denies any history of trauma or abuse. She states she has not received inpatient psychiatric treatment in the past.   She denies any active substance use, but states she has tried a marijuana gummy from a tobacco store. She denies alcohol use, vaping or cigarette smoking. UDS positive for marijuana and amphetamines which is most likely due to prescription Vyvanse. BAL pending collection. PDMP Review reveals three active prescriptions.  During the evaluation, she appears anxious albeit cooperative, casually dressed. She is alert and oriented x4, maintains good eye contact, answers questions appropriately, no signs of preoccupation or distractibility    Collateral: The psychiatry team spoke with the patient's husband, Betsi Fugua [phone]) who says that her attitude is mean, she threatens to harm herself, and that she spirals out of control. He says that she hates everyone, her sleep is poor, she has both difficulty falling asleep and staying asleep. He reports a past suicide attempt greater than five years ago where she took pills, he thinks Tylenol, and she had to be sent to the ER. He does not remember if she had to be admitted to the hospital for the incident. Mr. Kwiat reports she is consistent with her  meds, although there are times when they are delayed in being delivered via  mail. He  says that she consumes marijuana daily; Either she smokes it or she consumes edible; Last used yesterday. He reports she occasional uses alcohol use. Mr. Dovey reports safety concerns and says the only reason she came to the hospital today was because he gave her an ultimatum.    Disposition:  Inpatient psychiatric hospitalization recommended for safety and stabilization  Past Psychiatric History: Anxiety, bipolar disorder, depression, marijuana dependence    Risk to Self:   Risk to Others:   Prior Inpatient Therapy:   Prior Outpatient Therapy:    Past Medical History:  Past Medical History:  Diagnosis Date   Anemia 09/14/2021   Anxiety    Depression    Endometriosis    Fibroid    Frank breech presentation 10/29/2016   GERD (gastroesophageal reflux disease) 12/10/2014   Gestational diabetes    Migraines    Pregnancy induced hypertension    S/P cesarean section 10/29/2016    Past Surgical History:  Procedure Laterality Date   ADENOIDECTOMY  10/29/2007   BREAST REDUCTION SURGERY     BREAST SURGERY  08/2015   CESAREAN SECTION N/A 10/29/2016   Procedure: CESAREAN SECTION;  Surgeon: Sherian Rein, MD;  Location: [facility];  Service: Obstetrics;  Laterality: N/A;   CESAREAN SECTION N/A 07/25/2021   Procedure: Repeat CESAREAN SECTION;  Surgeon: Noland Fordyce, MD;  Location: [facility] LD ORS;  Service: Obstetrics;  Laterality: N/A;   Repeat C/S BTL   LAPAROSCOPY  12/10/2014   NISSEN FUNDOPLICATION     RHINOPLASTY  03/22/2010   deviated septum   TONSILLECTOMY  2017   TUBAL LIGATION  07/25/2021   Family History:  Family History  Problem Relation Age of Onset   Heart disease Mother    Hypertension Mother    Anxiety disorder Mother    Depression Mother    Alcohol abuse Mother    Arthritis Mother    Hypertension Father    Anxiety disorder Father    Depression Father    Heart attack Father    Heart failure Father    Testicular cancer Brother    Diabetes Maternal  Grandmother    Heart attack Maternal Grandfather        x 2   Family Psychiatric  History: Charted history of mother with alcohol abuse, anxiety, depression and father with anxiety, depression.   Social History:  Social History   Substance and Sexual Activity  Alcohol Use Yes   Comment: less than one drink per month     Social History   Substance and Sexual Activity  Drug Use No    Social History   Socioeconomic History   Marital status: Married    Spouse name: Not on file   Number of children: Not on file   Years of education: Not on file   Highest education level: Not on file  Occupational History   Occupation: Homemaker  Tobacco Use   Smoking status: Never   Smokeless tobacco: Never  Vaping Use   Vaping status: Never Used  Substance and Sexual Activity   Alcohol use: Yes    Comment: less than one drink per month   Drug use: No   Sexual activity: Yes    Birth control/protection: Surgical  Other Topics Concern   Not on file  Social History Narrative   Stay at home   2 children -- 39 year old son and 21 year old  daughter (2024)   Married   Social Determinants of Health   Financial Resource Strain: Not on file  Food Insecurity: Food Insecurity Present (04/14/2021)   Hunger Vital Sign    Worried About Running Out of Food in the Last Year: Never true    Ran Out of Food in the Last Year: Sometimes true  Transportation Needs: Not on file  Physical Activity: Not on file  Stress: Not on file  Social Connections: Unknown (04/04/2023)   [facility] from [facility]   Social Network    Social Network: Not on file   Additional Social History:    Allergies:  No Known Allergies  Labs:  Results for orders placed or performed during the hospital encounter of 06/06/23 (from the past 48 hour(s))  Comprehensive metabolic panel     Status: Abnormal   Collection Time: 06/06/23 10:17 AM  Result Value Ref Range   Sodium 138 135 - 145 mmol/L   Potassium 3.9 3.5 - 5.1  mmol/L   Chloride 106 98 - 111 mmol/L   CO2 22 22 - 32 mmol/L   Glucose, Bld 108 (H) 70 - 99 mg/dL    Comment: Glucose reference range applies only to samples taken after fasting for at least 8 hours.   BUN 18 6 - 20 mg/dL   Creatinine, Ser 1.09 0.44 - 1.00 mg/dL   Calcium 9.0 8.9 - 32.3 mg/dL   Total Protein 7.7 6.5 - 8.1 g/dL   Albumin 4.4 3.5 - 5.0 g/dL   AST 14 (L) 15 - 41 U/L   ALT 14 0 - 44 U/L   Alkaline Phosphatase 45 38 - 126 U/L   Total Bilirubin 0.9 0.3 - 1.2 mg/dL   GFR, Estimated >55 >73 mL/min    Comment: (NOTE) Calculated using the CKD-EPI Creatinine Equation (2021)    Anion gap 10 5 - 15    Comment: Performed at [facility], [address] [place], [place] [address]  cbc     Status: None   Collection Time: 06/06/23 10:17 AM  Result Value Ref Range   WBC 5.7 4.0 - 10.5 K/uL   RBC 4.42 3.87 - 5.11 MIL/uL   Hemoglobin 13.7 12.0 - 15.0 g/dL   HCT 42.7 06.2 - 37.6 %   MCV 91.4 80.0 - 100.0 fL   MCH 31.0 26.0 - 34.0 pg   MCHC 33.9 30.0 - 36.0 g/dL   RDW 28.3 15.1 - 76.1 %   Platelets 289 150 - 400 K/uL   nRBC 0.0 0.0 - 0.2 %    Comment: Performed at [facility], [address] [place], [place] [address]  Urine Drug Screen, Qualitative     Status: Abnormal   Collection Time: 06/06/23 10:18 AM  Result Value Ref Range   Tricyclic, Ur Screen NONE DETECTED NONE DETECTED   Amphetamines, Ur Screen POSITIVE (A) NONE DETECTED   MDMA (Ecstasy)Ur Screen NONE DETECTED NONE DETECTED   Cocaine Metabolite,Ur Burt NONE DETECTED NONE DETECTED   Opiate, Ur Screen NONE DETECTED NONE DETECTED   Phencyclidine (PCP) Ur S NONE DETECTED NONE DETECTED   Cannabinoid 50 Ng, Ur  POSITIVE (A) NONE DETECTED   Barbiturates, Ur Screen NONE DETECTED NONE DETECTED   Benzodiazepine, Ur Scrn NONE DETECTED NONE DETECTED   Methadone Scn, Ur NONE DETECTED NONE DETECTED    Comment: (NOTE) Tricyclics + metabolites, urine    Cutoff 1000 ng/mL Amphetamines + metabolites, urine   Cutoff 1000 ng/mL MDMA (Ecstasy), urine  Cutoff 500 ng/mL Cocaine Metabolite, urine          Cutoff 300 ng/mL Opiate + metabolites, urine        Cutoff 300 ng/mL Phencyclidine (PCP), urine         Cutoff 25 ng/mL Cannabinoid, urine                 Cutoff 50 ng/mL Barbiturates + metabolites, urine  Cutoff 200 ng/mL Benzodiazepine, urine              Cutoff 200 ng/mL Methadone, urine                   Cutoff 300 ng/mL  The urine drug screen provides only a preliminary, unconfirmed analytical test result and should not be used for non-medical purposes. Clinical consideration and professional judgment should be applied to any positive drug screen result due to possible interfering substances. A more specific alternate chemical method must be used in order to obtain a confirmed analytical result. Gas chromatography / mass spectrometry (GC/MS) is the preferred confirm atory method. Performed at [facility], [address] [place], [place] [address]   Pregnancy, urine     Status: None   Collection Time: 06/06/23 10:18 AM  Result Value Ref Range   Preg Test, Ur NEGATIVE NEGATIVE    Comment:        THE SENSITIVITY OF THIS METHODOLOGY IS >25 mIU/mL. Performed at [facility], [address] [place], [place] [address]     Current Facility-Administered Medications  Medication Dose Route Frequency Provider Last Rate Last Daun Peacock ON 06/07/2023] ARIPiprazole (ABILIFY) tablet 10 mg  10 mg Oral Daily Jihad Brownlow H, NP       [START ON 06/07/2023] lamoTRIgine (LAMICTAL) tablet 50 mg  50 mg Oral Daily Read Bonelli H, NP       [START ON 06/07/2023] lisdexamfetamine (VYVANSE) capsule 30 mg  30 mg Oral Daily Orson Aloe, [facility] [address]       And   [START ON 06/07/2023] lisdexamfetamine (VYVANSE) capsule 20 mg  20 mg Oral Daily Murriel Hopper M, RPH       vortioxetine HBr (TRINTELLIX) tablet 5 mg  5 mg Oral Daily Lisaanne Lawrie H, NP       zolpidem  (AMBIEN) tablet 5 mg  5 mg Oral QHS PRN Midge Momon H, NP       Current Outpatient Medications  Medication Sig Dispense Refill   ARIPiprazole (ABILIFY) 5 MG tablet Take 1 tablet (5 mg total) by mouth daily. 90 tablet 0   clonazePAM (KLONOPIN) 0.5 MG tablet Take 1 tablet (0.5 mg total) by mouth daily as needed for anxiety. 30 tablet 0   lamoTRIgine (LAMICTAL) 25 MG tablet Take 1 tablet (25 mg total) by mouth daily for 14 days, THEN 2 tablets (50 mg total) daily for 16 days. 46 tablet 1   lisdexamfetamine (VYVANSE) 50 MG capsule Take 1 capsule (50 mg total) by mouth daily. 30 capsule 0   naproxen (NAPROSYN) 500 MG tablet as needed for mild pain.     vortioxetine HBr (TRINTELLIX) 10 MG TABS tablet Take 1 tablet (10 mg total) by mouth daily. 90 tablet 0   zolpidem (AMBIEN) 10 MG tablet Take 1 tablet (10 mg total) by mouth at bedtime as needed for sleep. 30 tablet 0    Musculoskeletal: Strength & Muscle Tone: within normal limits Gait & Station: normal Patient leans: N/A  Psychiatric Specialty Exam:  Presentation  General Appearance:  Casual  Eye Contact: Good  Speech: Clear and Coherent  Speech Volume: Normal  Handedness: Right   Mood and Affect  Mood: Anxious  Affect: Congruent   Thought Process  Thought Processes: Coherent  Descriptions of Associations:Intact  Orientation:Full (Time, Place and Person)  Thought Content:WDL  History of Schizophrenia/Schizoaffective disorder:No data recorded Duration of Psychotic Symptoms:No data recorded Hallucinations:Hallucinations: None  Ideas of Reference:None  Suicidal Thoughts:Suicidal Thoughts: No  Homicidal Thoughts:Homicidal Thoughts: No   Sensorium  Memory: Recent Good  Judgment: Fair  Insight: Good   Executive Functions  Concentration: Good  Attention Span: Good  Recall: Good  Fund of Knowledge: Good  Language: Good   Psychomotor Activity  Psychomotor  Activity:Psychomotor Activity: Normal   Assets  Assets: Communication Skills; Desire for Improvement; Housing; Resilience; Social Support   Sleep  Sleep:Sleep: Poor   Physical Exam: Physical Exam Vitals and nursing note reviewed.  Constitutional:      General: She is not in acute distress. HENT:     Head: Normocephalic.     Nose: Nose normal.  Pulmonary:     Effort: Pulmonary effort is normal. No respiratory distress.  Musculoskeletal:        General: Normal range of motion.     Cervical back: Normal range of motion.  Neurological:     Mental Status: She is alert and oriented to person, place, and time.    Review of Systems  Respiratory:  Negative for shortness of breath.   Psychiatric/Behavioral:  Positive for depression and substance abuse. The patient is nervous/anxious and has insomnia.   All other systems reviewed and are negative.  Blood pressure (!) 144/99, pulse 69, temperature 98.2 F (36.8 C), temperature source Oral, resp. rate 17, height 5\' 6"  (1.676 m), weight 81.6 kg, last menstrual period 06/04/2023, SpO2 100%, not currently breastfeeding. Body mass index is 29.04 kg/m.  Treatment Plan Summary: Daily contact with patient to assess and evaluate symptoms and progress in treatment, Medication management, and Plan :  Inpatient psychiatric hospitalization recommended for safety and stabilization. Medications reviewed by pharmacy and will be restarted:    Bipolar type 2:    - Continue Abilify 5 mg daily    - Continue Lamotrigine 50 mg daily    - Continue Trintellix 10 mg daily     Anxiety:    - Start Hydroxyzine 50 mg three times daily as needed   ADHD:    - Continue Vyvanse 50 mg daily     5. Insomnia:    - Will decease Ambien from 10 mg to 5 mg at bedtime as needed for sleep per pharmacy recommendation (For female patients age 65 years or older, dosage automatically limited to 5 mg)    Disposition: Recommend psychiatric Inpatient admission when  medically cleared. Supportive therapy provided about ongoing stressors.  Norma Fredrickson, NP 06/06/2023 5:35 PM

## 2023-06-06 NOTE — ED Notes (Signed)
VOL  GOING TO  [facility]  TONIGHT  06/06/23

## 2023-06-06 NOTE — ED Triage Notes (Signed)
Pt here with SI for a few month but getting stronger over the past 2 days. Pt started a new medication in July, Abilify and Lamitcal. Pt states she believes the medication is helping but is having SI out of the blue. Pt denies any pain.

## 2023-06-06 NOTE — ED Notes (Signed)
Pt is crying, she states that she is very upset about her children being at home and her having to stay here, she states that she was not expecting that when she came here, she was expecting some therary recommendations and possible medication adjustment.  Warm blankets given.

## 2023-06-06 NOTE — ED Provider Notes (Signed)
[facility] Provider Note    Event Date/Time   First MD Initiated Contact with Patient 06/06/23 1150     (approximate)   History   Suicidal   HPI  Morgan Chang is a 27 y.o. female here with feeling like she needs psychiatric help.  The patient has a history of type II bipolar disorder.  She states she has never been hospitalized.  Over the last few weeks, she has been "ramping up" and had increasing difficulty sleeping.  She has had some thoughts about wanting to not be alive although she denies overt suicidal plans.  She states that she has slept very little over the last few days her husband advised her to come to the ED.  She is amenable to seeking care.  She is been taking her medications as prescribed but has reportedly been out of her Klonopin.     Physical Exam   Triage Vital Signs: ED Triage Vitals  Encounter Vitals Group     BP 06/06/23 1016 (!) 144/99     Systolic BP Percentile --      Diastolic BP Percentile --      Pulse Rate 06/06/23 1016 69     Resp 06/06/23 1016 17     Temp 06/06/23 1016 98.2 F (36.8 C)     Temp Source 06/06/23 1016 Oral     SpO2 06/06/23 1016 100 %     Weight 06/06/23 1017 180 lb (81.6 kg)     Height 06/06/23 1017 5\' 6"  (1.676 m)     Head Circumference --      Peak Flow --      Pain Score 06/06/23 1021 0     Pain Loc --      Pain Education --      Exclude from Growth Chart --     Most recent vital signs: Vitals:   06/06/23 1016  BP: (!) 144/99  Pulse: 69  Resp: 17  Temp: 98.2 F (36.8 C)  SpO2: 100%     General: Awake, no distress.  CV:  Good peripheral perfusion.  Resp:  Normal work of breathing.  Abd:  No distention.  Other:  Slightly pressured speech, endorses passive SI without plan.  No homicidal ideation.   ED Results / Procedures / Treatments   Labs (all labs ordered are listed, but only abnormal results are displayed) Labs Reviewed  COMPREHENSIVE METABOLIC PANEL - Abnormal; Notable  for the following components:      Result Value   Glucose, Bld 108 (*)    AST 14 (*)    All other components within normal limits  URINE DRUG SCREEN, QUALITATIVE ([facility] ONLY) - Abnormal; Notable for the following components:   Amphetamines, Ur Screen POSITIVE (*)    Cannabinoid 50 Ng, Ur [place] POSITIVE (*)    All other components within normal limits  CBC  PREGNANCY, URINE  ACETAMINOPHEN LEVEL  SALICYLATE LEVEL  ETHANOL     EKG    RADIOLOGY    I also independently reviewed and agree with radiologist interpretations.   PROCEDURES:  Critical Care performed: No   MEDICATIONS ORDERED IN ED: Medications  clonazePAM (KLONOPIN) tablet 1 mg (1 mg Oral Given 06/06/23 1323)     IMPRESSION / MDM / ASSESSMENT AND PLAN / ED COURSE  I reviewed the triage vital signs and the nursing notes.  Differential diagnosis includes, but is not limited to, hypomanic episode, anxiety, insomnia, sleep deprivation, adjustment disorder  Patient's presentation is most consistent with acute presentation with potential threat to life or bodily function.  The patient is on the cardiac monitor to evaluate for evidence of arrhythmia and/or significant heart rate changes   27 year old female with history of type II bipolar disorder here with symptoms concerning for possible early hypomania versus insomnia.  Patient contracting for safety and has family that is supportive.  Will consult psychiatry for recommendations regarding medication adjustments and management as well as disposition, as well as give a dose of Klonopin here.  She has reportedly been out of her Klonopin, which at the least should likely be refilled for short course pending further evaluation   FINAL CLINICAL IMPRESSION(S) / ED DIAGNOSES   Final diagnoses:  Bipolar 2 disorder (HCC)     Rx / DC Orders   ED Discharge Orders     None        Note:  This document was prepared using Dragon voice  recognition software and may include unintentional dictation errors.   Shaune Pollack, MD 06/06/23 [phone]

## 2023-06-06 NOTE — BH Assessment (Signed)
Patient is to be admitted to [facility] BMU tonight 06/06/23 after 9:00pm by Dr.  Marval Regal .  Attending Physician will be Dr.  Marval Regal .   Patient has been assigned to room 302, by [facility] Charge Nurse Latham Kinzler.    ER staff is aware of the admission: Misty Stanley, ER Secretary   Dr. Lenard Lance, ER MD  Morrie Sheldon, Patient's Nurse  Alli, Patient Access.

## 2023-06-07 MED ORDER — CLONAZEPAM 0.5 MG PO TABS
0.5000 mg | ORAL_TABLET | Freq: Every day | ORAL | Status: DC
  Administered 2023-06-07 – 2023-06-10 (×4): 0.5 mg via ORAL
  Filled 2023-06-07 (×4): qty 1

## 2023-06-07 MED ORDER — CLONAZEPAM 0.5 MG PO TABS: 0.5000 mg | ORAL_TABLET | Freq: Every day | ORAL | Status: DC

## 2023-06-07 NOTE — BH IP Treatment Plan (Signed)
Interdisciplinary Treatment and Diagnostic Plan Update  06/07/2023 Time of Session: 9:27AM [place] MRN: [id number]  Principal Diagnosis: Bipolar 2 disorder, major depressive episode (HCC)  Secondary Diagnoses: Principal Problem:   Bipolar 2 disorder, major depressive episode (HCC)   Current Medications:  Current Facility-Administered Medications  Medication Dose Route Frequency Provider Last Rate Last Admin   acetaminophen (TYLENOL) tablet 650 mg  650 mg Oral Q6H PRN Bennett, Christal H, NP       alum & mag hydroxide-simeth (MAALOX/MYLANTA) 200-200-20 MG/5ML suspension 30 mL  30 mL Oral Q4H PRN Bennett, Christal H, NP       ARIPiprazole (ABILIFY) tablet 10 mg  10 mg Oral Daily Bennett, Christal H, NP   10 mg at 06/07/23 0813   diphenhydrAMINE (BENADRYL) capsule 50 mg  50 mg Oral TID PRN Bennett, Christal H, NP       Or   diphenhydrAMINE (BENADRYL) injection 50 mg  50 mg Intramuscular TID PRN Bennett, Christal H, NP       haloperidol (HALDOL) tablet 5 mg  5 mg Oral TID PRN Bennett, Christal H, NP       Or   haloperidol lactate (HALDOL) injection 5 mg  5 mg Intramuscular TID PRN Bennett, Christal H, NP       hydrOXYzine (ATARAX) tablet 50 mg  50 mg Oral TID PRN Willeen Cass, Christal H, NP   50 mg at 06/07/23 1914   lamoTRIgine (LAMICTAL) tablet 50 mg  50 mg Oral Daily Bennett, Christal H, NP   50 mg at 06/07/23 0813   lisdexamfetamine (VYVANSE) capsule 30 mg  30 mg Oral Daily Bennett, Christal H, NP   30 mg at 06/07/23 7829   And   lisdexamfetamine (VYVANSE) capsule 20 mg  20 mg Oral Daily Bennett, Christal H, NP   20 mg at 06/07/23 0813   LORazepam (ATIVAN) tablet 2 mg  2 mg Oral TID PRN Bennett, Christal H, NP       Or   LORazepam (ATIVAN) injection 2 mg  2 mg Intramuscular TID PRN Bennett, Christal H, NP       magnesium hydroxide (MILK OF MAGNESIA) suspension 15 mL  15 mL Oral Daily PRN Bennett, Christal H, NP       vortioxetine HBr (TRINTELLIX) tablet 5 mg  5 mg Oral Daily  Bennett, Christal H, NP   5 mg at 06/07/23 0813   zolpidem (AMBIEN) tablet 5 mg  5 mg Oral QHS PRN Bennett, Christal H, NP   5 mg at 06/06/23 2306   PTA Medications: Medications Prior to Admission  Medication Sig Dispense Refill Last Dose   ARIPiprazole (ABILIFY) 5 MG tablet Take 1 tablet (5 mg total) by mouth daily. 90 tablet 0    clonazePAM (KLONOPIN) 0.5 MG tablet Take 1 tablet (0.5 mg total) by mouth daily as needed for anxiety. 30 tablet 0    lamoTRIgine (LAMICTAL) 25 MG tablet Take 1 tablet (25 mg total) by mouth daily for 14 days, THEN 2 tablets (50 mg total) daily for 16 days. 46 tablet 1    lisdexamfetamine (VYVANSE) 50 MG capsule Take 1 capsule (50 mg total) by mouth daily. 30 capsule 0    naproxen (NAPROSYN) 500 MG tablet as needed for mild pain.      vortioxetine HBr (TRINTELLIX) 10 MG TABS tablet Take 1 tablet (10 mg total) by mouth daily. 90 tablet 0    zolpidem (AMBIEN) 10 MG tablet Take 1 tablet (10 mg total) by mouth at bedtime as  needed for sleep. 30 tablet 0     Patient Stressors:    Patient Strengths:    Treatment Modalities: Medication Management, Group therapy, Case management,  1 to 1 session with clinician, Psychoeducation, Recreational therapy.   Physician Treatment Plan for Primary Diagnosis: Bipolar 2 disorder, major depressive episode (HCC) Long Term Goal(s):     Short Term Goals:    Medication Management: Evaluate patient's response, side effects, and tolerance of medication regimen.  Therapeutic Interventions: 1 to 1 sessions, Unit Group sessions and Medication administration.  Evaluation of Outcomes: Not Met  Physician Treatment Plan for Secondary Diagnosis: Principal Problem:   Bipolar 2 disorder, major depressive episode (HCC)  Long Term Goal(s):     Short Term Goals:       Medication Management: Evaluate patient's response, side effects, and tolerance of medication regimen.  Therapeutic Interventions: 1 to 1 sessions, Unit Group sessions  and Medication administration.  Evaluation of Outcomes: Not Met   RN Treatment Plan for Primary Diagnosis: Bipolar 2 disorder, major depressive episode (HCC) Long Term Goal(s): Knowledge of disease and therapeutic regimen to maintain health will improve  Short Term Goals: Ability to demonstrate self-control, Ability to participate in decision making will improve, Ability to verbalize feelings will improve, Ability to disclose and discuss suicidal ideas, Ability to identify and develop effective coping behaviors will improve, and Compliance with prescribed medications will improve  Medication Management: RN will administer medications as ordered by provider, will assess and evaluate patient's response and provide education to patient for prescribed medication. RN will report any adverse and/or side effects to prescribing provider.  Therapeutic Interventions: 1 on 1 counseling sessions, Psychoeducation, Medication administration, Evaluate responses to treatment, Monitor vital signs and CBGs as ordered, Perform/monitor CIWA, COWS, AIMS and Fall Risk screenings as ordered, Perform wound care treatments as ordered.  Evaluation of Outcomes: Not Met   LCSW Treatment Plan for Primary Diagnosis: Bipolar 2 disorder, major depressive episode (HCC) Long Term Goal(s): Safe transition to appropriate next level of care at discharge, Engage patient in therapeutic group addressing interpersonal concerns.  Short Term Goals: Engage patient in aftercare planning with referrals and resources, Increase social support, Increase ability to appropriately verbalize feelings, Increase emotional regulation, Facilitate acceptance of mental health diagnosis and concerns, and Increase skills for wellness and recovery  Therapeutic Interventions: Assess for all discharge needs, 1 to 1 time with Social worker, Explore available resources and support systems, Assess for adequacy in community support network, Educate family and  significant other(s) on suicide prevention, Complete Psychosocial Assessment, Interpersonal group therapy.  Evaluation of Outcomes: Not Met   Progress in Treatment: Attending groups: Yes. Participating in groups: Yes. Taking medication as prescribed: Yes. Toleration medication: Yes. Family/Significant other contact made: No, will contact:  once permission has been given.   Patient understands diagnosis: Yes. Discussing patient identified problems/goals with staff: Yes. Medical problems stabilized or resolved: Yes. Denies suicidal/homicidal ideation: Yes. Issues/concerns per patient self-inventory: No. Other: none  New problem(s) identified: No, Describe:  none  New Short Term/Long Term Goal(s): detox, elimination of symptoms of psychosis, medication management for mood stabilization; elimination of SI thoughts; development of comprehensive mental wellness/sobriety plan.   Patient Goals:  "working on regulating my emotions, my anxiety mostly"  Discharge Plan or Barriers: CSW to assist in the development of appropriate development of discharge plans.    Reason for Continuation of Hospitalization: Anxiety Depression Medication stabilization Suicidal ideation  Estimated Length of Stay:  1-7 days  Last 3 [place] Suicide Severity  Risk Score: Flowsheet Row Admission (Current) from 06/06/2023 in [facility] INPATIENT BEH[facility]ICINE Most recent reading at 06/06/2023 11:00 PM ED from 06/06/2023 in [facility] Emergency Department at [facility] Most recent reading at 06/06/2023 10:22 AM ED from 04/15/2023 in [facility] Most recent reading at 04/15/2023  8:28 PM  C-SSRS RISK CATEGORY Low Risk Low Risk Low Risk       Last PHQ 2/9 Scores:    04/20/2023    9:50 AM 12/01/2022    1:57 PM 08/25/2022    9:23 AM  Depression screen PHQ 2/9  Decreased Interest 1 0 3  Down, Depressed, Hopeless 2 1 3   PHQ - 2 Score 3 1 6   Altered sleeping 3 3 1   Tired,  decreased energy 3 2 3   Change in appetite 3 1 3   Feeling bad or failure about yourself  2 3 2   Trouble concentrating 1 1 1   Moving slowly or fidgety/restless 0 0 2  Suicidal thoughts 0 0 0  PHQ-9 Score 15 11 18   Difficult doing work/chores Very difficult  Extremely dIfficult    Scribe for Treatment Team: Harden Mo, LCSW 06/07/2023 10:28 AM

## 2023-06-07 NOTE — Group Note (Signed)
Date:  06/07/2023 Time:  5:03 PM  Group Topic/Focus:  Outdoor recreation    Participation Level:  Active  Participation Quality:  Appropriate and Attentive  Affect:  Appropriate  Cognitive:  Alert, Appropriate, and Oriented  Insight: Appropriate  Engagement in Group:  Developing/Improving and Engaged  Modes of Intervention:  Activity, Discussion, and Socialization  Additional Comments:    Rosaura Carpenter 06/07/2023, 5:03 PM

## 2023-06-07 NOTE — H&P (Signed)
Psychiatric Admission Assessment Adult  Patient Identification: Morgan Chang MRN:  [id number] Date of Evaluation:  06/07/2023 Chief Complaint:  Bipolar 2 disorder, major depressive episode (HCC) [F31.81] Principal Diagnosis: Bipolar 2 disorder, major depressive episode (HCC) Diagnosis:  Principal Problem:   Bipolar 2 disorder, major depressive episode (HCC)  History of Present Illness: Patient is a 27 year old Caucasian female, married, and admitted for the first time.Reports feeling "overwhelmed" at her new job as a Production assistant, radio at [employer]. "I got confused; this is my first time working."History of ADHD, treated with Vyvanse, which she takes as needed. Reports experiencing anxiety: "I get so anxious sometimes, so I take a Klonopin." Difficulty sleeping: "Sometimes I can't sleep a lot of times. I have Ambien to help me sleep." Expresses motivation to improve: "I want to get better and keep focused; sometimes it just be too much."  Associated Signs/Symptoms: Depression Symptoms:  difficulty concentrating, anxiety, disturbed sleep, (Hypo) Manic Symptoms:  Distractibility, Elevated Mood, Anxiety Symptoms:  Social Anxiety, Psychotic Symptoms:   NONE NOTED PTSD Symptoms: Negative Total Time spent with patient: 2.5 hours  Past Psychiatric History: ADHD INSOMNIA BIPOLAR  Is the patient at risk to self? No.  Has the patient been a risk to self in the past 6 months? No.  Has the patient been a risk to self within the distant past? No.  Is the patient a risk to others? No.  Has the patient been a risk to others in the past 6 months? No.  Has the patient been a risk to others within the distant past? No.   [place] Scale:  Flowsheet Row Admission (Current) from 06/06/2023 in [facility] INPATIENT BEHAVIORAL MEDICINE Most recent reading at 06/06/2023 11:00 PM ED from 06/06/2023 in [facility] Department at [facility] Most recent reading at 06/06/2023 10:22 AM ED from 04/15/2023 in [facility] Most recent reading at 04/15/2023  8:28 PM  C-SSRS RISK CATEGORY Low Risk Low Risk Low Risk        Prior Inpatient Therapy: No.  Prior Outpatient Therapy: Yes.   If yes, describes Dr Mercy Riding    Alcohol Screening: 1. How often do you have a drink containing alcohol?: Never 2. How many drinks containing alcohol do you have on a typical day when you are drinking?: 1 or 2 3. How often do you have six or more drinks on one occasion?: Never AUDIT-C Score: 0 4. How often during the last year have you found that you were not able to stop drinking once you had started?: Never 5. How often during the last year have you failed to do what was normally expected from you because of drinking?: Never 6. How often during the last year have you needed a first drink in the morning to get yourself going after a heavy drinking session?: Never 7. How often during the last year have you had a feeling of guilt of remorse after drinking?: Never 8. How often during the last year have you been unable to remember what happened the night before because you had been drinking?: Never 9. Have you or someone else been injured as a result of your drinking?: No 10. Has a relative or friend or a doctor or another health worker been concerned about your drinking or suggested you cut down?: No Alcohol Use Disorder Identification Test Final Score (AUDIT): 0 Alcohol Brief Interventions/Follow-up: Alcohol education/Brief advice Substance Abuse History in the last 12 months:  Yes.  MARIJUANA NOTED IN UDS Consequences of Substance  Abuse: Negative Previous Psychotropic Medications: Yes  Psychological Evaluations: No  Past Medical History:  Past Medical History:  Diagnosis Date   Anemia 09/14/2021   Anxiety    Depression    Endometriosis    Fibroid    Frank breech presentation 10/29/2016   GERD (gastroesophageal reflux disease) 12/10/2014   Gestational diabetes    Migraines    Pregnancy  induced hypertension    S/P cesarean section 10/29/2016    Past Surgical History:  Procedure Laterality Date   ADENOIDECTOMY  10/29/2007   BREAST REDUCTION SURGERY     BREAST SURGERY  08/2015   CESAREAN SECTION N/A 10/29/2016   Procedure: CESAREAN SECTION;  Surgeon: Sherian Rein, MD;  Location: [facility];  Service: Obstetrics;  Laterality: N/A;   CESAREAN SECTION N/A 07/25/2021   Procedure: Repeat CESAREAN SECTION;  Surgeon: Noland Fordyce, MD;  Location: [facility] LD ORS;  Service: Obstetrics;  Laterality: N/A;   Repeat C/S BTL   LAPAROSCOPY  12/10/2014   NISSEN FUNDOPLICATION     RHINOPLASTY  03/22/2010   deviated septum   TONSILLECTOMY  2017   TUBAL LIGATION  07/25/2021   Family History:  Family History  Problem Relation Age of Onset   Heart disease Mother    Hypertension Mother    Anxiety disorder Mother    Depression Mother    Alcohol abuse Mother    Arthritis Mother    Hypertension Father    Anxiety disorder Father    Depression Father    Heart attack Father    Heart failure Father    Testicular cancer Brother    Diabetes Maternal Grandmother    Heart attack Maternal Grandfather        x 2   Family Psychiatric  History: MATERNAL AND PATERNAL ANXIETY DEPRESSION AND ADDICTION Tobacco Screening:  Social History   Tobacco Use  Smoking Status Never   Passive exposure: Never  Smokeless Tobacco Never    [facility] Tobacco Counseling     Are you interested in Tobacco Cessation Medications?  N/A, patient does not use tobacco products Counseled patient on smoking cessation:  N/A, patient does not use tobacco products Reason Tobacco Screening Not Completed: No value filed.       Social History:  Social History   Substance and Sexual Activity  Alcohol Use Yes   Comment: less than one drink per month     Social History   Substance and Sexual Activity  Drug Use No                         Allergies:  No Known Allergies Lab Results:  Results for  orders placed or performed during the hospital encounter of 06/06/23 (from the past 48 hour(s))  Comprehensive metabolic panel     Status: Abnormal   Collection Time: 06/06/23 10:17 AM  Result Value Ref Range   Sodium 138 135 - 145 mmol/L   Potassium 3.9 3.5 - 5.1 mmol/L   Chloride 106 98 - 111 mmol/L   CO2 22 22 - 32 mmol/L   Glucose, Bld 108 (H) 70 - 99 mg/dL    Comment: Glucose reference range applies only to samples taken after fasting for at least 8 hours.   BUN 18 6 - 20 mg/dL   Creatinine, Ser 4.25 0.44 - 1.00 mg/dL   Calcium 9.0 8.9 - 95.6 mg/dL   Total Protein 7.7 6.5 - 8.1 g/dL   Albumin 4.4 3.5 - 5.0 g/dL  AST 14 (L) 15 - 41 U/L   ALT 14 0 - 44 U/L   Alkaline Phosphatase 45 38 - 126 U/L   Total Bilirubin 0.9 0.3 - 1.2 mg/dL   GFR, Estimated >32 >44 mL/min    Comment: (NOTE) Calculated using the CKD-EPI Creatinine Equation (2021)    Anion gap 10 5 - 15    Comment: Performed at [facility], [address] [place], [place] [address]  cbc     Status: None   Collection Time: 06/06/23 10:17 AM  Result Value Ref Range   WBC 5.7 4.0 - 10.5 K/uL   RBC 4.42 3.87 - 5.11 MIL/uL   Hemoglobin 13.7 12.0 - 15.0 g/dL   HCT 25.3 66.4 - 40.3 %   MCV 91.4 80.0 - 100.0 fL   MCH 31.0 26.0 - 34.0 pg   MCHC 33.9 30.0 - 36.0 g/dL   RDW 47.4 25.9 - 56.3 %   Platelets 289 150 - 400 K/uL   nRBC 0.0 0.0 - 0.2 %    Comment: Performed at [facility], [address], [place], [place] [address]  Urine Drug Screen, Qualitative     Status: Abnormal   Collection Time: 06/06/23 10:18 AM  Result Value Ref Range   Tricyclic, Ur Screen NONE DETECTED NONE DETECTED   Amphetamines, Ur Screen POSITIVE (A) NONE DETECTED   MDMA (Ecstasy)Ur Screen NONE DETECTED NONE DETECTED   Cocaine Metabolite,Ur [place] NONE DETECTED NONE DETECTED   Opiate, Ur Screen NONE DETECTED NONE DETECTED   Phencyclidine (PCP) Ur S NONE DETECTED NONE DETECTED   Cannabinoid 50 Ng, Ur [place] POSITIVE (A) NONE  DETECTED   Barbiturates, Ur Screen NONE DETECTED NONE DETECTED   Benzodiazepine, Ur Scrn NONE DETECTED NONE DETECTED   Methadone Scn, Ur NONE DETECTED NONE DETECTED    Comment: (NOTE) Tricyclics + metabolites, urine    Cutoff 1000 ng/mL Amphetamines + metabolites, urine  Cutoff 1000 ng/mL MDMA (Ecstasy), urine              Cutoff 500 ng/mL Cocaine Metabolite, urine          Cutoff 300 ng/mL Opiate + metabolites, urine        Cutoff 300 ng/mL Phencyclidine (PCP), urine         Cutoff 25 ng/mL Cannabinoid, urine                 Cutoff 50 ng/mL Barbiturates + metabolites, urine  Cutoff 200 ng/mL Benzodiazepine, urine              Cutoff 200 ng/mL Methadone, urine                   Cutoff 300 ng/mL  The urine drug screen provides only a preliminary, unconfirmed analytical test result and should not be used for non-medical purposes. Clinical consideration and professional judgment should be applied to any positive drug screen result due to possible interfering substances. A more specific alternate chemical method must be used in order to obtain a confirmed analytical result. Gas chromatography / mass spectrometry (GC/MS) is the preferred confirm atory method. Performed at [facility], [address] [place], [place] [address]   Pregnancy, urine     Status: None   Collection Time: 06/06/23 10:18 AM  Result Value Ref Range   Preg Test, Ur NEGATIVE NEGATIVE    Comment:        THE SENSITIVITY OF THIS METHODOLOGY IS >25 mIU/mL. Performed at [facility], [phone]  [address] [place], [place] [address]     Blood Alcohol level:  No results found for: "ETH"  Metabolic Disorder Labs:  Lab Results  Component Value Date   HGBA1C 5.2 04/20/2023   No results found for: "PROLACTIN" Lab Results  Component Value Date   CHOL 167 04/20/2023   TRIG 99.0 04/20/2023   HDL 56.30 04/20/2023   CHOLHDL 3 04/20/2023   VLDL 19.8 04/20/2023   LDLCALC 91 04/20/2023    Current  Medications: Current Facility-Administered Medications  Medication Dose Route Frequency Provider Last Rate Last Admin   acetaminophen (TYLENOL) tablet 650 mg  650 mg Oral Q6H PRN Bennett, Christal H, NP       alum & mag hydroxide-simeth (MAALOX/MYLANTA) 200-200-20 MG/5ML suspension 30 mL  30 mL Oral Q4H PRN Bennett, Christal H, NP       ARIPiprazole (ABILIFY) tablet 10 mg  10 mg Oral Daily Bennett, Christal H, NP   10 mg at 06/07/23 0813   clonazePAM (KLONOPIN) tablet 0.5 mg  0.5 mg Oral Daily Myriam Forehand, NP       diphenhydrAMINE (BENADRYL) capsule 50 mg  50 mg Oral TID PRN Bennett, Christal H, NP       Or   diphenhydrAMINE (BENADRYL) injection 50 mg  50 mg Intramuscular TID PRN Bennett, Christal H, NP       haloperidol (HALDOL) tablet 5 mg  5 mg Oral TID PRN Bennett, Christal H, NP       Or   haloperidol lactate (HALDOL) injection 5 mg  5 mg Intramuscular TID PRN Bennett, Christal H, NP       hydrOXYzine (ATARAX) tablet 50 mg  50 mg Oral TID PRN Willeen Cass, Christal H, NP   50 mg at 06/07/23 6045   lamoTRIgine (LAMICTAL) tablet 50 mg  50 mg Oral Daily Bennett, Christal H, NP   50 mg at 06/07/23 0813   LORazepam (ATIVAN) tablet 2 mg  2 mg Oral TID PRN Bennett, Christal H, NP       Or   LORazepam (ATIVAN) injection 2 mg  2 mg Intramuscular TID PRN Bennett, Christal H, NP       magnesium hydroxide (MILK OF MAGNESIA) suspension 15 mL  15 mL Oral Daily PRN Bennett, Christal H, NP       vortioxetine HBr (TRINTELLIX) tablet 5 mg  5 mg Oral Daily Bennett, Christal H, NP   5 mg at 06/07/23 0813   zolpidem (AMBIEN) tablet 5 mg  5 mg Oral QHS PRN Myriam Forehand, NP   5 mg at 06/06/23 2306   PTA Medications: Medications Prior to Admission  Medication Sig Dispense Refill Last Dose   ARIPiprazole (ABILIFY) 5 MG tablet Take 1 tablet (5 mg total) by mouth daily. 90 tablet 0    clonazePAM (KLONOPIN) 0.5 MG tablet Take 1 tablet (0.5 mg total) by mouth daily as needed for anxiety. 30 tablet 0    lamoTRIgine  (LAMICTAL) 25 MG tablet Take 1 tablet (25 mg total) by mouth daily for 14 days, THEN 2 tablets (50 mg total) daily for 16 days. 46 tablet 1    lisdexamfetamine (VYVANSE) 50 MG capsule Take 1 capsule (50 mg total) by mouth daily. 30 capsule 0    naproxen (NAPROSYN) 500 MG tablet as needed for mild pain.      vortioxetine HBr (TRINTELLIX) 10 MG TABS tablet Take 1 tablet (10 mg total) by mouth daily. 90 tablet 0    zolpidem (AMBIEN) 10 MG tablet Take  1 tablet (10 mg total) by mouth at bedtime as needed for sleep. 30 tablet 0     Musculoskeletal: Strength & Muscle Tone: within normal limits Gait & Station: normal Patient leans:  none            Psychiatric Specialty Exam:  Presentation  General Appearance:  WELL GROOMED Eye Contact:  Speech:  Speech Volume: Normal  Handedness: Right   Mood and Affect  Mood: Fidgety, restless throughout the interview.   Affect:  Moderately anxious, appropriate to the conversation.   Thought Process  Thought Processes: Coherent  Duration of Psychotic Symptoms:N/A Past Diagnosis of Schizophrenia or Psychoactive disorder: No  Descriptions of Associations:Intact  Orientation:Full (Time, Place and Person)  Thought Content:WDL  Hallucinations:Hallucinations: None  Ideas of Reference:None  Suicidal Thoughts:Suicidal Thoughts: No  Homicidal Thoughts:Homicidal Thoughts: No   Sensorium  Memory: Recent Good  Judgment: Fair  Insight: Good   Executive Functions  Concentration: Good  Attention Span: Good  Recall: Good  Fund of Knowledge: Good  Language: Good   Psychomotor Activity  Psychomotor Activity: Psychomotor Activity: Normal   Assets  Assets: Communication Skills; Desire for Improvement; Housing; Resilience; Social Support   Sleep  Sleep: REPORTS "BEST SLEEP I HAVE HAD IN A MONTH"   Physical Exam: Physical Exam Vitals and nursing note reviewed.  Constitutional:      Appearance:  Normal appearance.  HENT:     Head: Normocephalic and atraumatic.     Nose: Nose normal.  Pulmonary:     Effort: Pulmonary effort is normal.  Musculoskeletal:        General: Normal range of motion.     Cervical back: Normal range of motion.  Neurological:     General: No focal deficit present.     Mental Status: She is alert and oriented to person, place, and time.  Psychiatric:        Mood and Affect: Mood normal.     Blood pressure (!) 87/56, pulse 66, temperature 98.4 F (36.9 C), resp. rate 20, height 5\' 6"  (1.676 m), weight 81.6 kg, last menstrual period 06/04/2023, SpO2 98%, not currently breastfeeding. Body mass index is 29.05 kg/m.  Treatment Plan Summary: Daily contact with patient to assess and evaluate symptoms and progress in treatment and Medication management  Observation Level/Precautions:  Continuous Observation 15 minute checks  Laboratory:   NONE  Psychotherapy:    Medications:    Consultations:    Discharge Concerns:    Estimated LOS:  Other:     Physician Treatment Plan for Primary Diagnosis: Bipolar 2 disorder, major depressive episode (HCC) Long Term Goal(s): Improvement in symptoms so as ready for discharge  Short Term Goals: Ability to identify changes in lifestyle to reduce recurrence of condition will improve and Ability to identify triggers associated with substance abuse/mental health issues will improve  Physician Treatment Plan for Secondary Diagnosis: Principal Problem:   Bipolar 2 disorder, major depressive episode (HCC)  Long Term Goal(s): Improvement in symptoms so as ready for discharge  Short Term Goals: Ability to identify changes in lifestyle to reduce recurrence of condition will improve, Ability to verbalize feelings will improve, Ability to disclose and discuss suicidal ideas, Ability to demonstrate self-control will improve, Ability to identify and develop effective coping behaviors will improve, Ability to maintain clinical  measurements within normal limits will improve, Compliance with prescribed medications will improve, and Ability to identify triggers associated with substance abuse/mental health issues will improve  I certify that inpatient services furnished can reasonably  be expected to improve the patient's condition.    Myriam Forehand, NP 9/18/20242:57 PM

## 2023-06-07 NOTE — Progress Notes (Signed)
   06/07/23 1500  Spiritual Encounters  Type of Visit Initial  Care provided to: Patient  Conversation partners present during encounter Nurse  Referral source Chaplain assessment  Reason for visit Routine spiritual support  OnCall Visit No  Spiritual Framework  Presenting Themes Impactful experiences and emotions;Courage hope and growth;Caregiving needs  Patient Stress Factors Exhausted  Family Stress Factors Exhausted  Interventions  Spiritual Care Interventions Made Encouragement;Compassionate presence;Established relationship of care and support;Reflective listening  Intervention Outcomes  Outcomes Connection to spiritual care;Awareness around self/spiritual resourses;Reduced anxiety   Chaplain met with patient in the hallway. Patient told the chaplain she came to the hospital because she was feeling very anxious. She stated that she is feeling much better now that she got some sleep. Patient shared that she really misses her children and she looks forward to seeing them soon. Chaplain offered words of hope and encouragement.

## 2023-06-07 NOTE — Group Note (Signed)
Date:  06/07/2023 Time:  2:26 PM  Group Topic/Focus:  Developing a Wellness Toolbox:   The focus of this group is to help patients develop a "wellness toolbox" with skills and strategies to promote recovery upon discharge. Dimensions of Wellness:   The focus of this group is to introduce the topic of wellness and discuss the role each dimension of wellness plays in total health.    Participation Level:  Active  Participation Quality:  Appropriate and Attentive  Affect:  Appropriate  Cognitive:  Alert and Appropriate  Insight: Appropriate  Engagement in Group:  Developing/Improving and Engaged  Modes of Intervention:  Activity  Additional Comments:    Othar Curto 06/07/2023, 2:26 PM

## 2023-06-07 NOTE — Progress Notes (Signed)
Suicide Risk Assessment  Admission Assessment    [facility] Admission Suicide Risk Assessment   Nursing information obtained from:  Patient Demographic factors:  Adolescent or young adult, Caucasian Current Mental Status:  Alert Ox4 Loss Factors:  None Historical Factors:  None Risk Reduction Factors:  Responsible for children under 29 years of age, Sense of responsibility to family, Employed, Living with another person, especially a relative, Positive social support  Total Time spent with patient: 2 hours Principal Problem: Bipolar 2 disorder, major depressive episode (HCC) Diagnosis:  Principal Problem:   Bipolar 2 disorder, major depressive episode (HCC)  Subjective Data: Patient is a 27 year old Caucasian female, married, and admitted for the first time. Reports feeling "overwhelmed" at her new job as a Production assistant, radio at [employer]. "I got confused; this is my first time working."History of ADHD, treated with Vyvanse, which she takes as needed. Reports experiencing anxiety: "I get so anxious sometimes, so I take a Klonopin." Difficulty sleeping: "Sometimes I can't sleep a lot of times. I have Ambien to help me sleep." Expresses motivation to improve: "I want to get better and keep focused; sometimes it just be too much."  Continued Clinical Symptoms:  Alcohol Use Disorder Identification Test Final Score (AUDIT): 0 The "Alcohol Use Disorders Identification Test", Guidelines for Use in Primary Care, Second Edition.  World Science writer [facility]). Score between 0-7:  no or low risk or alcohol related problems. Score between 8-15:  moderate risk of alcohol related problems. Score between 16-19:  high risk of alcohol related problems. Score 20 or above:  warrants further diagnostic evaluation for alcohol dependence and treatment.   CLINICAL FACTORS:   Bipolar Disorder:   Bipolar II More than one psychiatric diagnosis   Musculoskeletal: Strength & Muscle Tone: within normal limits Gait &  Station: normal Patient leans: NONE  Psychiatric Specialty Exam:  Presentation  General Appearance:  Casual  Eye Contact: Good  Speech: Clear and Coherent  Speech Volume: Normal  Handedness: Right   Mood and Affect  Mood: Anxious  Affect: Congruent   Thought Process  Thought Processes: Coherent  Descriptions of Associations:Intact  Orientation:Full (Time, Place and Person)  Thought Content:WDL  History of Schizophrenia/Schizoaffective disorder:No  Duration of Psychotic Symptoms:none Hallucinations:Hallucinations: None  Ideas of Reference:None  Suicidal Thoughts:Suicidal Thoughts: No  Homicidal Thoughts:Homicidal Thoughts: No   Sensorium  Memory: Recent Good  Judgment: Fair  Insight: Good   Executive Functions  Concentration: Good  Attention Span: Good  Recall: Good  Fund of Knowledge: Good  Language: Good   Psychomotor Activity  Psychomotor Activity: Psychomotor Activity: Normal   Assets  Assets: Communication Skills; Desire for Improvement; Housing; Resilience; Social Support   Sleep  Sleep: Fair   Physical Exam: Physical Exam ROS Blood pressure (!) 87/56, pulse 66, temperature 98.4 F (36.9 C), resp. rate 20, height 5\' 6"  (1.676 m), weight 81.6 kg, last menstrual period 06/04/2023, SpO2 98%, not currently breastfeeding. Body mass index is 29.05 kg/m.   COGNITIVE FEATURES THAT CONTRIBUTE TO RISK:  None    SUICIDE RISK:   Mild:  Suicidal ideation of limited frequency, intensity, duration, and specificity.  There are no identifiable plans, no associated intent, mild dysphoria and related symptoms, good self-control (both objective and subjective assessment), few other risk factors, and identifiable protective factors, including available and accessible social support.  PLAN OF CARE: Patient's anxiety appears to be related to her new job and her pre-existing ADHD. The current medication regimen Patient is  motivated to get better and maintain  focus but struggles with overwhelming stressors.Explore time-management and organization skills, particularly with adjusting to her new role at work.Discuss potential risks associated with the long-term use of Klonopin and Ambien.   I certify that inpatient services furnished can reasonably be expected to improve the patient's condition.   Myriam Forehand, NP 06/07/2023, 2:15 PM

## 2023-06-07 NOTE — Group Note (Signed)
Recreation Therapy Group Note   Group Topic:Problem Solving  Group Date: 06/07/2023 Start Time: 1000 End Time: 1050 Facilitators: Rosina Lowenstein, LRT, CTRS Location:  Craft Room  Group Description: Life Boat. Patients were given the scenario that they are on a boat that is about to become shipwrecked, leaving them stranded on an [place]. They are asked to make a list of 15 different items that they want to take with them when they are stranded on the [place]. Patients are asked to rank their items from most important to least important, #1 being the most important and #15 being the least. Patients will work individually for the first round to come up with 15 items and then pair up with a peer(s) to condense their list and come up with one list of 15 items between the two of them. Patients or LRT will read aloud the 15 different items to the group after each round. LRT facilitated post-activity processing to discuss how this activity can be used in daily life post discharge.   Goal Area(s) Addressed:  Patient will identify priorities, wants and needs. Patient will communicate with LRT and peers. Patient will work collectively as a Administrator, Civil Service. Patient will work on Product manager.    Affect/Mood: Appropriate   Participation Level: Active and Engaged   Participation Quality: Independent   Behavior: Appropriate, Calm, and Cooperative   Speech/Thought Process: Coherent   Insight: Good   Judgement: Good   Modes of Intervention: Activity   Patient Response to Interventions:  Attentive, Engaged, Interested , and Receptive   Education Outcome:  Acknowledges education   Clinical Observations/Individualized Feedback: Cyarra was active in their participation of session activities and group discussion. Pt identified "boat, snacks, cot, clothes, rope, notebook and pens" as some of the items she will bring with her on the [place]. Pt interacted well with LRT and peers duration of  session.   Plan: Continue to engage patient in RT group sessions 2-3x/week.   Rosina Lowenstein, LRT, CTRS 06/07/2023 11:04 AM

## 2023-06-07 NOTE — Group Note (Signed)
Date:  06/07/2023 Time:  10:34 PM  Group Topic/Focus:  Wellness Toolbox:   The focus of this group is to discuss various aspects of wellness, balancing those aspects and exploring ways to increase the ability to experience wellness.  Patients will create a wellness toolbox for use upon discharge.    Participation Level:  Active  Participation Quality:  Appropriate and Attentive  Affect:  Appropriate  Cognitive:  Alert and Appropriate  Insight: Appropriate and Good  Engagement in Group:  Developing/Improving  Modes of Intervention:  Clarification, Discussion, Education, and Rapport Building  Additional Comments:     Morgan Chang 06/07/2023, 10:34 PM

## 2023-06-07 NOTE — Plan of Care (Signed)
Patient very pleasant and cooperative on approach. Patient states that her anxiety is better when she got out of the house. Patient denies SI,HI and AVH. Patient had PRN anxiety x 2 this shift. Appetite and energy level good. Support and encouragement given.

## 2023-06-07 NOTE — Progress Notes (Signed)
Pt was admitted to the [facility]U just before midnight.  Pt complaints are anxiety, depression and hyperactivity.  Pt denies current SI HI AVH.  Pt had fleeting passive SI earlier today which led to her admission.  She agreed to treatment rules and was oriented to unit.  Continued monitoring for safety.  06/07/23 0000  Psych Admission Type (Psych Patients Only)  Admission Status Voluntary  Psychosocial Assessment  Patient Complaints Depression;Anxiety;Hyperactivity  Eye Contact Fair  Facial Expression Animated  Affect Appropriate to circumstance  Speech Logical/coherent  Interaction Assertive  Motor Activity Other (Comment)  Appearance/Hygiene In scrubs  Behavior Characteristics Cooperative;Appropriate to situation  Mood Anxious  Thought Process  Coherency WDL  Content WDL  Delusions None reported or observed  Perception WDL  Hallucination None reported or observed  Judgment Impaired  Confusion None  Danger to Self  Current suicidal ideation? Denies  Danger to Others  Danger to Others None reported or observed

## 2023-06-08 NOTE — Plan of Care (Signed)
  Problem: Education: Goal: Knowledge of the prescribed therapeutic regimen will improve Outcome: Progressing   Problem: Self-Concept: Goal: Level of anxiety will decrease Outcome: Progressing

## 2023-06-08 NOTE — Progress Notes (Signed)
Patient pleasant and cooperative. Complains of anxiety and insomnia. Meds given with good relief. Denies SI, HI, AVH. Noted smiling and talking with peers. Attended group.  Encouragement and support provided. Safety checks maintained. Meds given as prescribed. Pt receptive and remains safe on unit with q 15 min checks.

## 2023-06-08 NOTE — Plan of Care (Signed)
Problem: Education: Goal: Knowledge of the prescribed therapeutic regimen will improve Outcome: Progressing   Problem: Activity: Goal: Interest or engagement in leisure activities will improve Outcome: Progressing   Problem: Coping: Goal: Coping ability will improve Outcome: Progressing Goal: Will verbalize feelings Outcome: Progressing   Problem: Health Behavior/Discharge Planning: Goal: Compliance with therapeutic regimen will improve Outcome: Progressing   Problem: Safety: Goal: Ability to disclose and discuss suicidal ideas will improve Outcome: Progressing

## 2023-06-08 NOTE — [facility] Counselor (Signed)
Adult Comprehensive Assessment  Patient ID: Morgan Chang, female   DOB: November 07, 1995, 27 y.o.   MRN: [id number]  Information Source: Information source: Patient  Current Stressors:  Patient states their primary concerns and needs for treatment are:: "I was having a lot of anxiety adn a lot of stress adn needed to talk to someone" Patient states their goals for this hospitilization and ongoing recovery are:: "working on regulating my anxiety" Educational / Learning stressors: Pt denies. Employment / Job issues: "just started last Thursday" Family Relationships: Pt denies. Financial / Lack of resources (include bankruptcy): Pt denies. Housing / Lack of housing: Pt denies. Physical health (include injuries & life threatening diseases): Pt denies. Social relationships: Pt denies. Substance abuse: "An edible occassionally" Bereavement / Loss: Pt denies.  Living/Environment/Situation:  Living Arrangements: Spouse/significant other, Children Living conditions (as described by patient or guardian): WNL Who else lives in the home?: "husband and two kids" How long has patient lived in current situation?: "April" What is atmosphere in current home: Other (Comment) ("sometimes stressful")  Family History:  Marital status: Married Number of Years Married: 7 What types of issues is patient dealing with in the relationship?: Pt denies. Are you sexually active?: Yes What is your sexual orientation?: "Bisexual" Has your sexual activity been affected by drugs, alcohol, medication, or emotional stress?: Pt denies. Does patient have children?: Yes How many children?: 2 How is patient's relationship with their children?: "great"  Pt reports children are 6 and 2.  They are with thier father at this time.  Childhood History:  By whom was/is the patient raised?: Both parents Description of patient's relationship with caregiver when they were a child: "good" Patient's description of current  relationship with people who raised him/her: "they were very involved and supportive" How were you disciplined when you got in trouble as a child/adolescent?: "still close, don't see my dad as much" Does patient have siblings?: Yes Number of Siblings: 2 Description of patient's current relationship with siblings: "we're all close" Did patient suffer any verbal/emotional/physical/sexual abuse as a child?: No Did patient suffer from severe childhood neglect?: No Has patient ever been sexually abused/assaulted/raped as an adolescent or adult?: No Was the patient ever a victim of a crime or a disaster?: No Witnessed domestic violence?: No Has patient been affected by domestic violence as an adult?: No  Education:  Highest grade of school patient has completed: Transport planner" Currently a student?: No Learning disability?: No  Employment/Work Situation:   Employment Situation: (P) Employed 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?: (P) No  Financial Resources:   Surveyor, quantity resources: (P) Income from employment, Income from spouse [facility]) Does patient have a Lawyer or guardian?: (P) No  Alcohol/Substance Abuse:   What has been your use of drugs/alcohol within the last 12 months?: (P) Pt reports that she has an edible once a week. If attempted suicide, did drugs/alcohol play a role in this?: (P) No Alcohol/Substance Abuse Treatment Hx: (P) Denies past history Has alcohol/substance abuse ever caused legal  problems?: (  P) No  Social Support System:   Patient's Community Support System: (P) Good Describe Community Support System: (P) "my husband, my mom" Type of faith/religion: (P) "Christianity" How does patient's faith help to cope with current illness?: (P) "pray"  Leisure/Recreation:   Do You Have Hobbies?: (P) Yes Leisure and Hobbies: (P) "crafts and going to the gym"  Strengths/Needs:   What is the patient's perception of their strengths?: (P) "I think I'm very optimistic, very caring and outgoing" Patient states they can use these personal strengths during their treatment to contribute to their recovery: (P) Pt denies. Patient states these barriers may affect/interfere with their treatment: (P) Pt denies. Patient states these barriers may affect their return to the community: (P) Pt denies. Other important information patient would like considered in planning for their treatment: (P) Pt denies.  Discharge Plan:   Currently receiving community mental health services: (P) Yes (From Whom) (Dr Mercy Riding with [facility].) Patient states concerns and preferences for aftercare planning are: (P) Pt reports plans to continue with Dr. Mercy Riding.  Reports she would like a referral for a therapist. Patient states they will know when they are safe and ready for discharge when: (P) "knowing that I have my husband and family there for support" Does patient have access to transportation?: (P) Yes Does patient have financial barriers related to discharge medications?: (P) No Will patient be returning to same living situation after discharge?: (P) Yes  Summary/Recommendations:   Summary and Recommendations (to be completed by the evaluator): Patient is a 27 year old female [facility] [place] [place] [facility]  Patient presents to the hospital for suicidal ideations.  Patient reports that she has had these thoughts for the past few months, however, the thoughts have been increasing over the last two  days.  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  She reports that she began work and feels overwhelmed and stressed.  She reports that she also has been overwhelmed with having two small children at home.  She reports that she is current with Dr. Mercy Riding at [facility] and would like to continue care with him.  She reports that she would like to be linked to a therapist at discharge.  Recommendations include: crisis stabilization, therapeutic milieu, encourage group attendance and participation, medication management for mood stabilization and development of comprehensive mental wellness/sobriety plan.  Harden Mo. 06/08/2023

## 2023-06-08 NOTE — Progress Notes (Signed)
Patient denies SI,HI,and A/V/H with no plan or intent. Patient socializing appropriately and cooperative on unit.      06/08/23 0840  Psych Admission Type (Psych Patients Only)  Admission Status Voluntary  Psychosocial Assessment  Patient Complaints Anxiety  Eye Contact Fair  Facial Expression Animated  Affect Appropriate to circumstance  Speech Logical/coherent  Interaction Assertive  Motor Activity Other (Comment) (WDL)  Appearance/Hygiene Unremarkable  Behavior Characteristics Cooperative;Anxious  Mood Pleasant;Anxious  Thought Process  Coherency WDL  Content WDL  Delusions None reported or observed  Perception WDL  Hallucination None reported or observed  Judgment Limited  Confusion None  Danger to Self  Current suicidal ideation? Denies  Agreement Not to Harm Self Yes  Description of Agreement verbal  Danger to Others  Danger to Others None reported or observed

## 2023-06-08 NOTE — [facility] Suicide Risk Assessment (Addendum)
[facility] INPATIENT:  Family/Significant Other Suicide Prevention Education  Suicide Prevention Education:  Education Completed; Morgan Chang, husband, [phone] been identified by the patient as the family member/significant other with whom the patient will be residing, and identified as the person(s) who will aid the patient in the event of a mental health crisis (suicidal ideations/suicide attempt).  With written consent from the patient, the family member/significant other has been provided the following suicide prevention education, prior to the and/or following the discharge of the patient.  The suicide prevention education provided includes the following: Suicide risk factors Suicide prevention and interventions National Suicide Hotline telephone number [facility] assessment telephone number [facility] Emergency Assistance 911 [facility] and/or Residential Mobile Crisis Unit telephone number  Request made of family/significant other to: Remove weapons (e.g., guns, rifles, knives), all items previously/currently identified as safety concern.   Remove drugs/medications (over-the-counter, prescriptions, illicit drugs), all items previously/currently identified as a safety concern.  The family member/significant other verbalizes understanding of the suicide prevention education information provided.  The family member/significant other agrees to remove the items of safety concern listed above.  Husband reports that the patient "doesn't do well with change".  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  He reports that he think that pt was led to the hospital via "snowball effect".  He reports that patient does not have access to the family's weapons.   He reports that patient is not a danger to self or others.   He reports that he thinks that patient is doing better, "seemed like her old normal self".  He reports that he  no longer feels that patient is a danger to self or others.   Husband is curious of a discharge date, he reports that he is missing work and so is the patient will negatively impact the family.    Harden Mo 06/08/2023, 4:18 PM

## 2023-06-08 NOTE — Group Note (Signed)
Date:  06/08/2023 Time:  3:38 PM  Group Topic/Focus:  Outdoor Recreation    Participation Level:  Active  Participation Quality:  Appropriate  Affect:  Appropriate  Cognitive:  Appropriate  Insight: Appropriate  Engagement in Group:  Engaged  Modes of Intervention:  Activity  Additional Comments:    Sarath Privott 06/08/2023, 3:38 PM

## 2023-06-08 NOTE — Group Note (Signed)
Date:  06/08/2023 Time:  4:40 PM  Group Topic/Focus:  Outdoor Recreation/ Music Therapy    Participation Level:  Active  Participation Quality:  Appropriate  Affect:  Appropriate  Cognitive:  Alert and Appropriate  Insight: Appropriate  Engagement in Group:  Developing/Improving and Engaged  Modes of Intervention:  Activity  Additional Comments:    Morgan Chang 06/08/2023, 4:40 PM

## 2023-06-08 NOTE — Progress Notes (Addendum)
[facility] MD Progress Note  06/08/2023 4:49 PM Morgan Chang  MRN:  [id number] Subjective:  The patient is a 27 year old female who reports, "I feel much better now," and attributes previous distraction to the Vyvanse she was taking, stating, "It was the Vyvanse that was causing me to be distracted." The patient expresses a desire to go home but acknowledges, "I know I have to stay 72 hours." She mentions having an appointment with her psychiatrist in a few weeks. Principal Problem: Bipolar 2 disorder, major depressive episode (HCC) Diagnosis: Principal Problem:   Bipolar 2 disorder, major depressive episode (HCC)  Total Time spent with patient: 45 minutes  Past Psychiatric History: ADHD , Bipolar, Depression  Past Medical History:  Past Medical History:  Diagnosis Date   Anemia 09/14/2021   Anxiety    Depression    Endometriosis    Fibroid    Frank breech presentation 10/29/2016   GERD (gastroesophageal reflux disease) 12/10/2014   Gestational diabetes    Migraines    Pregnancy induced hypertension    S/P cesarean section 10/29/2016    Past Surgical History:  Procedure Laterality Date   ADENOIDECTOMY  10/29/2007   BREAST REDUCTION SURGERY     BREAST SURGERY  08/2015   CESAREAN SECTION N/A 10/29/2016   Procedure: CESAREAN SECTION;  Surgeon: Sherian Rein, MD;  Location: [facility];  Service: Obstetrics;  Laterality: N/A;   CESAREAN SECTION N/A 07/25/2021   Procedure: Repeat CESAREAN SECTION;  Surgeon: Noland Fordyce, MD;  Location: [facility] LD ORS;  Service: Obstetrics;  Laterality: N/A;   Repeat C/S BTL   LAPAROSCOPY  12/10/2014   NISSEN FUNDOPLICATION     RHINOPLASTY  03/22/2010   deviated septum   TONSILLECTOMY  2017   TUBAL LIGATION  07/25/2021   Family History:  Family History  Problem Relation Age of Onset   Heart disease Mother    Hypertension Mother    Anxiety disorder Mother    Depression Mother    Alcohol abuse Mother    Arthritis Mother     Hypertension Father    Anxiety disorder Father    Depression Father    Heart attack Father    Heart failure Father    Testicular cancer Brother    Diabetes Maternal Grandmother    Heart attack Maternal Grandfather        x 2   Family Psychiatric  History: see above Social History:  Social History   Substance and Sexual Activity  Alcohol Use Yes   Comment: less than one drink per month     Social History   Substance and Sexual Activity  Drug Use No    Social History   Socioeconomic History   Marital status: Married    Spouse name: Not on file   Number of children: Not on file   Years of education: Not on file   Highest education level: Not on file  Occupational History   Occupation: Homemaker  Tobacco Use   Smoking status: Never    Passive exposure: Never   Smokeless tobacco: Never  Vaping Use   Vaping status: Never Used  Substance and Sexual Activity   Alcohol use: Yes    Comment: less than one drink per month   Drug use: No   Sexual activity: Yes    Birth control/protection: Surgical  Other Topics Concern   Not on file  Social History Narrative   Stay at home   2 children -- 58 year old son and 69 year old  daughter (2024)   Married   Social Determinants of Corporate investment banker Strain: Not on file  Food Insecurity: No Food Insecurity (06/06/2023)   Hunger Vital Sign    Worried About Running Out of Food in the Last Year: Never true    Ran Out of Food in the Last Year: Never true  Transportation Needs: No Transportation Needs (06/06/2023)   PRAPARE - Administrator, Civil Service (Medical): No    Lack of Transportation (Non-Medical): No  Physical Activity: Not on file  Stress: Not on file  Social Connections: Unknown (04/04/2023)   [facility] from [facility]   Social Network    Social Network: Not on file   Additional Social History:                         Sleep: Good  Appetite:  Good  Current Medications: Current  Facility-Administered Medications  Medication Dose Route Frequency Provider Last Rate Last Admin   acetaminophen (TYLENOL) tablet 650 mg  650 mg Oral Q6H PRN Bennett, Christal H, NP       alum & mag hydroxide-simeth (MAALOX/MYLANTA) 200-200-20 MG/5ML suspension 30 mL  30 mL Oral Q4H PRN Bennett, Christal H, NP       ARIPiprazole (ABILIFY) tablet 10 mg  10 mg Oral Daily Bennett, Christal H, NP   10 mg at 06/08/23 1610   clonazePAM (KLONOPIN) tablet 0.5 mg  0.5 mg Oral Daily Myriam Forehand, NP   0.5 mg at 06/08/23 1144   diphenhydrAMINE (BENADRYL) capsule 50 mg  50 mg Oral TID PRN Bennett, Christal H, NP       Or   diphenhydrAMINE (BENADRYL) injection 50 mg  50 mg Intramuscular TID PRN Bennett, Christal H, NP       haloperidol (HALDOL) tablet 5 mg  5 mg Oral TID PRN Bennett, Christal H, NP       Or   haloperidol lactate (HALDOL) injection 5 mg  5 mg Intramuscular TID PRN Bennett, Christal H, NP       hydrOXYzine (ATARAX) tablet 50 mg  50 mg Oral TID PRN Bennett, Christal H, NP   50 mg at 06/07/23 9604   lamoTRIgine (LAMICTAL) tablet 50 mg  50 mg Oral Daily Bennett, Christal H, NP   50 mg at 06/08/23 0838   LORazepam (ATIVAN) tablet 2 mg  2 mg Oral TID PRN Bennett, Christal H, NP       Or   LORazepam (ATIVAN) injection 2 mg  2 mg Intramuscular TID PRN Bennett, Christal H, NP       magnesium hydroxide (MILK OF MAGNESIA) suspension 15 mL  15 mL Oral Daily PRN Bennett, Christal H, NP       vortioxetine HBr (TRINTELLIX) tablet 5 mg  5 mg Oral Daily Bennett, Christal H, NP   5 mg at 06/08/23 0838   zolpidem (AMBIEN) tablet 5 mg  5 mg Oral QHS PRN Myriam Forehand, NP   5 mg at 06/07/23 2106    Lab Results: No results found for this or any previous visit (from the past 48 hour(s)).  Blood Alcohol level:  No results found for: "ETH"  Metabolic Disorder Labs: Lab Results  Component Value Date   HGBA1C 5.2 04/20/2023   No results found for: "PROLACTIN" Lab Results  Component Value Date   CHOL 167  04/20/2023   TRIG 99.0 04/20/2023   HDL 56.30 04/20/2023   CHOLHDL 3 04/20/2023  VLDL 19.8 04/20/2023   LDLCALC 91 04/20/2023    Physical Findings: AIMS:  , ,  ,  ,    CIWA:    COWS:     Musculoskeletal: Strength & Muscle Tone: within normal limits Gait & Station: normal Patient leans: N/A  Psychiatric Specialty Exam:  Presentation  General Appearance:  Appropriate for Environment; Casual  Eye Contact: Good  Speech: Clear and Coherent; Normal Rate  Speech Volume: Normal  Handedness: Right   Mood and Affect  Mood: Anxious (feeling postitive)  Affect: Congruent; Appropriate   Thought Process  Thought Processes: Coherent  Descriptions of Associations:Intact  Orientation:Full (Time, Place and Person)  Thought Content:WDL  History of Schizophrenia/Schizoaffective disorder:No  Duration of Psychotic Symptoms:n.one noted Hallucinations:Hallucinations: None  Ideas of Reference:None  Suicidal Thoughts:Suicidal Thoughts: No  Homicidal Thoughts:Homicidal Thoughts: No   Sensorium  Memory: Immediate Good  Judgment: Good  Insight: Good   Executive Functions  Concentration: Good  Attention Span: Good  Recall: Good  Fund of Knowledge: Good  Language: Good   Psychomotor Activity  Psychomotor Activity: Psychomotor Activity: Normal   Assets  Assets: Desire for Improvement; Physical Health; Resilience; Social Support; Housing; Transportation; Communication Skills   Sleep  Sleep: Sleep: Good Number of Hours of Sleep: 8    Physical Exam: Physical Exam Vitals and nursing note reviewed.  Constitutional:      Appearance: Normal appearance.  HENT:     Head: Normocephalic and atraumatic.     Nose: Nose normal.  Cardiovascular:     Rate and Rhythm: Normal rate.  Pulmonary:     Effort: Pulmonary effort is normal.  Musculoskeletal:        General: Normal range of motion.     Cervical back: Normal range of motion.   Neurological:     General: No focal deficit present.     Mental Status: She is alert and oriented to person, place, and time.    ROS Blood pressure 107/75, pulse 73, temperature 97.8 F (36.6 C), temperature source Oral, resp. rate 17, height 5\' 6"  (1.676 m), weight 81.6 kg, last menstrual period 06/04/2023, SpO2 99%, not currently breastfeeding. Body mass index is 29.05 kg/m.   Treatment Plan Summary: Daily contact with patient to assess and evaluate symptoms and progress in treatment and Medication management.Continue observation for the remainder of the 72-hour hold to ensure ongoing psychiatric stability and monitor for any changes in mood or behavior..Encourage the patient to discuss the impact of Vyvanse with her psychiatrist at the upcoming appointment to determine if adjustments to her treatment plan are needed.Reinforce the use of healthy coping mechanisms to manage any ongoing distractions or stressors.  Myriam Forehand, NP 06/08/2023, 4:49 PM

## 2023-06-08 NOTE — Group Note (Signed)
Date:  06/08/2023 Time:  10:11 AM  Group Topic/Focus:  Crisis Planning:   The purpose of this group is to help patients create a crisis plan for use upon discharge or in the future, as needed. Group [employer]. Structured Group Activity- Walking down memory lane ;Indulging in times like these can be a refreshing and positive experience. Reminiscing fond memories is beneficial to mental health as nostalgic feelings create confidence, promote optimism and aid in strengthening relationships. Nostalgia often manifests through possessions and experiences from the past.    Participation Level:  Active  Participation Quality:  Appropriate and Attentive  Affect:  Appropriate  Cognitive:  Alert, Appropriate, and Oriented  Insight: Appropriate  Engagement in Group:  Developing/Improving and Engaged  Modes of Intervention:  Activity, Discussion, Education, Role-play, and Socialization  Additional Comments:    Morgan Chang 06/08/2023, 10:11 AM

## 2023-06-09 DIAGNOSIS — F3181 Bipolar II disorder: Principal | ICD-10-CM

## 2023-06-09 MED ORDER — ZOLPIDEM TARTRATE 5 MG PO TABS
10.0000 mg | ORAL_TABLET | Freq: Every evening | ORAL | Status: DC | PRN
  Administered 2023-06-09: 10 mg via ORAL
  Filled 2023-06-09: qty 2

## 2023-06-09 NOTE — Progress Notes (Signed)
   Pt is alert and oriented.   Pt denies depression at this time but reports  anxiety of an 9/10. Pt got hydroxyzine for anxiety. Pt denies experiencing SI/HI or AVH at this time.      Scheduled medications administered to pt, per MD orders . Support and encouragement provided . Frequent verbal contacts  made. Routine safety checks conducted q15.     No adverse drug reactions noted . Pt verbally contracts for safety at this time. Pt is complaint with medications  and treatment plan. Pt interacts well with others on the unit. Pt remains safe with others on the unit. Pt remains safe at this time, plan of care ongoing.

## 2023-06-09 NOTE — Group Note (Unsigned)
Date:  06/09/2023 Time:  10:29 PM  Group Topic/Focus:  Recovery Goals:   The focus of this group is to identify appropriate goals for recovery and establish a plan to achieve them.     Participation Level:  {BHH PARTICIPATION ZOXWR:60454}  Participation Quality:  {BHH PARTICIPATION QUALITY:22265}  Affect:  {BHH AFFECT:22266}  Cognitive:  {BHH COGNITIVE:22267}  Insight: {BHH Insight2:20797}  Engagement in Group:  {BHH ENGAGEMENT IN UJWJX:91478}  Modes of Intervention:  {BHH MODES OF INTERVENTION:22269}  Additional Comments:  ***  Lenore Cordia 06/09/2023, 10:29 PM

## 2023-06-09 NOTE — Group Note (Signed)
Date:  06/09/2023 Time:  10:39 PM  Group Topic/Focus:  Wrap-Up Group:   The focus of this group is to help patients review their daily goal of treatment and discuss progress on daily workbooks.    Participation Level:  Active  Participation Quality:  Appropriate  Affect:  Appropriate  Cognitive:  Appropriate  Insight: Appropriate and Good  Engagement in Group:  Engaged  Modes of Intervention:  Discussion  Additional Comments:    Lenore Cordia 06/09/2023, 10:39 PM

## 2023-06-09 NOTE — Group Note (Signed)
Date:  06/09/2023 Time:  12:40 PM  Group Topic/Focus:  Crisis Planning:   The purpose of this group is to help patients create a crisis plan for use upon discharge or in the future, as needed.    Participation Level:  Active  Participation Quality:  Appropriate  Affect:  Appropriate  Cognitive:  Appropriate  Insight: Appropriate  Engagement in Group:  Engaged  Modes of Intervention:  Activity  Additional Comments:    Morgan Chang 06/09/2023, 12:40 PM

## 2023-06-09 NOTE — Group Note (Signed)
Date:  06/09/2023 Time:  5:01 AM  Group Topic/Focus:  Wrap-Up Group:   The focus of this group is to help patients review their daily goal of treatment and discuss progress on daily workbooks.    Participation Level:  Active  Participation Quality:  Appropriate  Affect:  Appropriate  Cognitive:  Appropriate  Insight: Good  Engagement in Group:  Developing/Improving  Modes of Intervention:  Confrontation  Additional Comments:   Lenore Cordia 06/09/2023, 5:01 AM

## 2023-06-09 NOTE — Progress Notes (Signed)
[facility] MD Progress Note  06/09/2023 11:20 AM Morgan Chang  MRN:  [id number]  Principal Problem: Bipolar 2 disorder, major depressive episode (HCC) Diagnosis: Principal Problem:   Bipolar 2 disorder, major depressive episode (HCC)  Patient is a  27y.o. female who presents to the [facility] unit due to worsened depression and anxiety.  Interval History Patient was seen today for re-evaluation.  Nursing reports no events overnight. The patient has no issues with performing ADLs.  Patient has been medication compliant.    Patient was seen and interviewed by attending psychiatrist. Chart reviewed. Patient discussed during treatment team rounds.  Subjective:  On assessment patient reports feeling "great; I feel like a new woman; my anxiety is less than one out of ten today; I am not depressed at all; I feel reset here and I am ready to get back home to my kids". She denies any mental complaints, except of insomnia; and reports good mood, denies feeling depressed, anxious, panicky. She denies thoughts or plans of hurting self or others. She states she was on 12.5 mg of Ambien at home ane the current dose of 5mg  is not enough for her to fall asleep. She reports good appetite.  Patient denies any symptoms of psychosis - denies auditory or visual hallucinations, denies feeling paranoid, unsafe, does not express any delusions. Patient reports no side effects from medications she is getting here. She denies any physical complaints.  Labs: no new results for review.    Total Time spent with patient: 30 minutes  Past Psychiatric History: see H&P  Past Medical History:  Past Medical History:  Diagnosis Date   Anemia 09/14/2021   Anxiety    Depression    Endometriosis    Fibroid    Frank breech presentation 10/29/2016   GERD (gastroesophageal reflux disease) 12/10/2014   Gestational diabetes    Migraines    Pregnancy induced hypertension    S/P cesarean section 10/29/2016    Past Surgical History:   Procedure Laterality Date   ADENOIDECTOMY  10/29/2007   BREAST REDUCTION SURGERY     BREAST SURGERY  08/2015   CESAREAN SECTION N/A 10/29/2016   Procedure: CESAREAN SECTION;  Surgeon: Sherian Rein, MD;  Location: [facility];  Service: Obstetrics;  Laterality: N/A;   CESAREAN SECTION N/A 07/25/2021   Procedure: Repeat CESAREAN SECTION;  Surgeon: Noland Fordyce, MD;  Location: [facility] LD ORS;  Service: Obstetrics;  Laterality: N/A;   Repeat C/S BTL   LAPAROSCOPY  12/10/2014   NISSEN FUNDOPLICATION     RHINOPLASTY  03/22/2010   deviated septum   TONSILLECTOMY  2017   TUBAL LIGATION  07/25/2021   Family History:  Family History  Problem Relation Age of Onset   Heart disease Mother    Hypertension Mother    Anxiety disorder Mother    Depression Mother    Alcohol abuse Mother    Arthritis Mother    Hypertension Father    Anxiety disorder Father    Depression Father    Heart attack Father    Heart failure Father    Testicular cancer Brother    Diabetes Maternal Grandmother    Heart attack Maternal Grandfather        x 2   Family Psychiatric  History: see H&P Social History:  Social History   Substance and Sexual Activity  Alcohol Use Yes   Comment: less than one drink per month     Social History   Substance and Sexual Activity  Drug Use No  Social History   Socioeconomic History   Marital status: Married    Spouse name: Not on file   Number of children: Not on file   Years of education: Not on file   Highest education level: Not on file  Occupational History   Occupation: Homemaker  Tobacco Use   Smoking status: Never    Passive exposure: Never   Smokeless tobacco: Never  Vaping Use   Vaping status: Never Used  Substance and Sexual Activity   Alcohol use: Yes    Comment: less than one drink per month   Drug use: No   Sexual activity: Yes    Birth control/protection: Surgical  Other Topics Concern   Not on file  Social History Narrative    Stay at home   2 children -- 51 year old son and 19 year old daughter (2024)   Married   Social Determinants of Corporate investment banker Strain: Not on file  Food Insecurity: No Food Insecurity (06/06/2023)   Hunger Vital Sign    Worried About Running Out of Food in the Last Year: Never true    Ran Out of Food in the Last Year: Never true  Transportation Needs: No Transportation Needs (06/06/2023)   PRAPARE - Administrator, Civil Service (Medical): No    Lack of Transportation (Non-Medical): No  Physical Activity: Not on file  Stress: Not on file  Social Connections: Unknown (04/04/2023)   Received from [facility]   Social Network    Social Network: Not on file   Additional Social History:                         Sleep: Good  Appetite:  Good  Current Medications: Current Facility-Administered Medications  Medication Dose Route Frequency Provider Last Rate Last Admin   acetaminophen (TYLENOL) tablet 650 mg  650 mg Oral Q6H PRN Bennett, Christal H, NP       alum & mag hydroxide-simeth (MAALOX/MYLANTA) 200-200-20 MG/5ML suspension 30 mL  30 mL Oral Q4H PRN Bennett, Christal H, NP       ARIPiprazole (ABILIFY) tablet 10 mg  10 mg Oral Daily Bennett, Christal H, NP   10 mg at 06/09/23 0754   clonazePAM (KLONOPIN) tablet 0.5 mg  0.5 mg Oral Daily Myriam Forehand, NP   0.5 mg at 06/09/23 1610   diphenhydrAMINE (BENADRYL) capsule 50 mg  50 mg Oral TID PRN Bennett, Christal H, NP       Or   diphenhydrAMINE (BENADRYL) injection 50 mg  50 mg Intramuscular TID PRN Bennett, Christal H, NP       haloperidol (HALDOL) tablet 5 mg  5 mg Oral TID PRN Bennett, Christal H, NP       Or   haloperidol lactate (HALDOL) injection 5 mg  5 mg Intramuscular TID PRN Bennett, Christal H, NP       hydrOXYzine (ATARAX) tablet 50 mg  50 mg Oral TID PRN Bennett, Christal H, NP   50 mg at 06/08/23 2208   lamoTRIgine (LAMICTAL) tablet 50 mg  50 mg Oral Daily Bennett, Christal H, NP   50  mg at 06/09/23 0754   LORazepam (ATIVAN) tablet 2 mg  2 mg Oral TID PRN Bennett, Christal H, NP       Or   LORazepam (ATIVAN) injection 2 mg  2 mg Intramuscular TID PRN Bennett, Christal H, NP       magnesium hydroxide (MILK  OF MAGNESIA) suspension 15 mL  15 mL Oral Daily PRN Bennett, Christal H, NP       vortioxetine HBr (TRINTELLIX) tablet 5 mg  5 mg Oral Daily Bennett, Christal H, NP   5 mg at 06/09/23 0755   zolpidem (AMBIEN) tablet 5 mg  5 mg Oral QHS PRN Myriam Forehand, NP   5 mg at 06/08/23 2116    Lab Results: No results found for this or any previous visit (from the past 48 hour(s)).  Blood Alcohol level:  No results found for: "ETH"  Metabolic Disorder Labs: Lab Results  Component Value Date   HGBA1C 5.2 04/20/2023   No results found for: "PROLACTIN" Lab Results  Component Value Date   CHOL 167 04/20/2023   TRIG 99.0 04/20/2023   HDL 56.30 04/20/2023   CHOLHDL 3 04/20/2023   VLDL 19.8 04/20/2023   LDLCALC 91 04/20/2023    Physical Findings: AIMS:  , ,  ,  ,    CIWA:    COWS:     Musculoskeletal: Strength & Muscle Tone: within normal limits Gait & Station: normal Patient leans: N/A  Psychiatric Specialty Exam:  Appearance:  CF, appearing stated age,  wearing appropriate to the situation casual/hospital clothes, with fair grooming and hygiene. Normal level of alertness and appropriate facial expression.  Attitude/Behavior: calm, cooperative, engaging with appropriate eye contact.  Motor: WNL; dyskinesias not evident. Gait appears in full range.  Speech: spontaneous, clear, coherent, normal comprehension.  Mood: euthymic, "great".  Affect: appropriately-reactive, full range.  Thought process: patient appears coherent, organized, logical, goal-directed, associations are appropriate, concrete but linear with questions  Thought content: patient denies suicidal thoughts, denies homicidal thoughts; did not express any delusions.  Thought perception: patient  denies auditory and visual hallucinations. Did not appear internally stimulated.  Cognition: patient is alert and oriented in self, place, date; with intact attention and concentration.  Insight: good, in regards of understanding of presence, nature, cause, and significance of mental or emotional problem.  Judgement: good, in regards of ability to make good decisions concerning the appropriate thing to do in various situations, including ability to form opinions regarding their mental health condition.   Psychomotor Activity  Psychomotor Activity: Psychomotor Activity: Normal   Assets  Assets: Desire for Improvement; Physical Health; Resilience; Social Support; Housing; Transportation; Communication Skills   Sleep  Sleep: Sleep: Good Number of Hours of Sleep: 8    Physical Exam: Physical Exam ROS Blood pressure 108/83, pulse 72, temperature 97.6 F (36.4 C), temperature source Oral, resp. rate 18, height 5\' 6"  (1.676 m), weight 81.6 kg, last menstrual period 06/04/2023, SpO2 98%, not currently breastfeeding. Body mass index is 29.05 kg/m.   Treatment Plan Summary: Daily contact with patient to assess and evaluate symptoms and progress in treatment and Medication management  Patient is a 27 year old female with the above-stated past psychiatric history who is seen in follow-up.  Chart reviewed. Patient discussed with nursing. Patient appears feeling much better compare to the initial presentation - not depressed, less anxious, no unsafe thoughts. The only complain - sleep - she is asking to increase the dose of sleeping medication closer to the one she was taking at home. She thinks she is ready for discharge and that she has scheduled outpatient psych appointment with Dr. Frederich Cha on 06/22/23.  Diagnoses/ Active problems: -Bipolar 2 disorder, depressed.   PLAN:  Safety and Monitoring: continue inpatient psych admission; 15-minute checks; daily contact with patient to assess  and evaluate symptoms and  progress in treatment; psychoeducation.Vital signs: q12 hours. Precautions: suicide, elopement, and assault. Placed on room lock out for meals, snacks and groups.  Psychiatric Problems: - continue Abilify 10mg  PO daily for mood stabilization. -continue Lamotrigine 50mg  PO daily for mood stabilization -continue Trintellix 5mg  PO daily for depression, anxiety -continue Clonazepam 0.5mg  PO daily for anxiety -increase Ambien to 10mg  PO at bedtime PRN insomnia. The risks/benefits/side-effects/alternatives to this medication were discussed in detail with the patient and time was given for questions.   - Encouraged patient to participate in unit milieu and in scheduled group therapies   Medical Problems: n/a  PRN medications:  acetaminophen, alum & mag hydroxide-simeth, diphenhydrAMINE **OR** diphenhydrAMINE, haloperidol **OR** haloperidol lactate, hydrOXYzine, LORazepam **OR** LORazepam, magnesium hydroxide, zolpidem  Pertinent Labs: no new labs ordered today  Consults: No new consults placed since yesterday    Discharge Planning: -Social work and case management to assist with discharge planning and identification of hospital follow-up needs prior to discharge -Estimated LOS: 1-2 days -Discharge Concerns: Need to establish a safety plan; Medication compliance and effectiveness -Discharge Goals: Return home with outpatient referrals for mental health follow-up including medication management/psychotherapy  Total Time Spent in Direct Patient Care:  I personally spent 35 minutes on the unit in direct patient care. The direct patient care time included face-to-face time with the patient, reviewing the patient's chart, communicating with other professionals, and coordinating care. Greater than 50% of this time was spent in counseling or coordinating care with the patient regarding goals of hospitalization, psycho-education, and discharge planning needs.   Thalia Party,  MD 06/09/2023, 11:20 AM

## 2023-06-09 NOTE — Plan of Care (Signed)

## 2023-06-09 NOTE — Group Note (Signed)
Date:  06/09/2023 Time:  3:24 PM  Group Topic/Focus:  Developing a Wellness Toolbox:   The focus of this group is to help patients develop a "wellness toolbox" with skills and strategies to promote recovery upon discharge.    Participation Level:  Active  Participation Quality:  Appropriate  Affect:  Appropriate  Cognitive:  Appropriate  Insight: Appropriate  Engagement in Group:  Engaged  Modes of Intervention:  Activity  Additional Comments:    Dillinger Aston 06/09/2023, 3:24 PM

## 2023-06-09 NOTE — Plan of Care (Signed)
Patient pleasant and cooperative on approach. Patient rated her depression 1/10 and anxiety 3/10. Denies SI,HI and AVH. Patient states that her goal for today is " stay positive and work on discharge plan." Appetite and energy level good. Attended groups. Support and encouragement given.

## 2023-06-10 ENCOUNTER — Other Ambulatory Visit (HOSPITAL_COMMUNITY): Payer: Self-pay | Admitting: Psychiatry

## 2023-06-10 DIAGNOSIS — F3181 Bipolar II disorder: Secondary | ICD-10-CM | POA: Diagnosis not present

## 2023-06-10 DIAGNOSIS — F411 Generalized anxiety disorder: Secondary | ICD-10-CM

## 2023-06-10 DIAGNOSIS — F430 Acute stress reaction: Secondary | ICD-10-CM

## 2023-06-10 MED ORDER — ARIPIPRAZOLE 10 MG PO TABS: 10.0000 mg | ORAL_TABLET | Freq: Every day | ORAL | 0 refills | Status: DC

## 2023-06-10 MED ORDER — LAMOTRIGINE 25 MG PO TABS: 50.0000 mg | ORAL_TABLET | Freq: Every day | ORAL | 1 refills | Status: DC

## 2023-06-10 MED ORDER — VORTIOXETINE HBR 5 MG PO TABS: 5.0000 mg | ORAL_TABLET | Freq: Every day | ORAL | 0 refills | Status: DC

## 2023-06-10 NOTE — Plan of Care (Signed)
Problem: Education: Goal: Utilization of techniques to improve thought processes will improve Outcome: Adequate for Discharge Goal: Knowledge of the prescribed therapeutic regimen will improve Outcome: Adequate for Discharge   Problem: Activity: Goal: Interest or engagement in leisure activities will improve Outcome: Adequate for Discharge Goal: Imbalance in normal sleep/wake cycle will improve Outcome: Adequate for Discharge   Problem: Coping: Goal: Coping ability will improve Outcome: Adequate for Discharge Goal: Will verbalize feelings Outcome: Adequate for Discharge   Problem: Health Behavior/Discharge Planning: Goal: Ability to make decisions will improve Outcome: Adequate for Discharge Goal: Compliance with therapeutic regimen will improve Outcome: Adequate for Discharge   Problem: Role Relationship: Goal: Will demonstrate positive changes in social behaviors and relationships Outcome: Adequate for Discharge   Problem: Safety: Goal: Ability to disclose and discuss suicidal ideas will improve Outcome: Adequate for Discharge Goal: Ability to identify and utilize support systems that promote safety will improve Outcome: Adequate for Discharge   Problem: Self-Concept: Goal: Will verbalize positive feelings about self Outcome: Adequate for Discharge Goal: Level of anxiety will decrease Outcome: Adequate for Discharge

## 2023-06-10 NOTE — Progress Notes (Signed)
  [facility] Adult Case Management Discharge Plan :  Will you be returning to the same living situation after discharge:  [address] [place], [address] At discharge, do you have transportation home?: Yes,  Patient drove herself Do you have the ability to pay for your medications: Yes,  patient stated yes.  Release of information consent forms completed and in the chart;  Patient's signature needed at discharge.  Patient to Follow up at:  Follow-up Information     [facility] Follow up.   Why: Appointment with Dr. Mercy Riding is scheduled for 06/22/2023 at 4:30PM. Appointment is virtual.  Therapy appointment is Nov 6 at [facility], appointment is face to face. Contact information: [address] [place] [address]                Next level of care provider has access to [facility] Link:yes  Safety Planning and Suicide Prevention discussed: Valentino Hue,  Khamil Pickett, husband, [phone]     Has patient been referred to the Quitline?: Patient refused referral for treatment  Patient has been referred for addiction treatment: Yes, the patient will follow up with an outpatient provider for substance use disorder. Therapist: appointment made  Marshell Levan, LCSW 06/10/2023, 10:10 AM

## 2023-06-10 NOTE — Plan of Care (Signed)

## 2023-06-10 NOTE — Progress Notes (Signed)
Discharge Note:  Patient denies SI/HI/AVH at this time. Discharge instructions and transition record gone over with patient. Patient agrees to comply with medication management, follow-up visit, and outpatient therapy. Patient belongings returned to patient. Patient questions and concerns addressed and answered. Patient ambulatory off unit. Patient discharged to home with family.

## 2023-06-10 NOTE — BHH Suicide Risk Assessment (Signed)
[facility] Admission Suicide Risk Assessment   Nursing information obtained from:  Patient Demographic factors:  Adolescent or young adult, Caucasian Current Mental Status:  Suicidal ideation indicated by patient Loss Factors:  NA Historical Factors:  NA Risk Reduction Factors:  Responsible for children under 29 years of age, Sense of responsibility to family, Employed, Living with another person, especially a relative, Positive social support  Total Time spent with patient: 30 minutes Principal Problem: Bipolar 2 disorder, major depressive episode (HCC) Diagnosis:  Principal Problem:   Bipolar 2 disorder, major depressive episode (HCC)  Subjective Data: PATIENT DENIES SUICIDAL IDEATIONS, PLANS, INTENTS ON THE DATE OF DISCHARGE, 06/10/23.  Continued Clinical Symptoms:  Alcohol Use Disorder Identification Test Final Score (AUDIT): 0 The "Alcohol Use Disorders Identification Test", Guidelines for Use in Primary Care, Second Edition.  World Science writer [facility]). Score between 0-7:  no or low risk or alcohol related problems. Score between 8-15:  moderate risk of alcohol related problems. Score between 16-19:  high risk of alcohol related problems. Score 20 or above:  warrants further diagnostic evaluation for alcohol dependence and treatment.   CLINICAL FACTORS:   Bipolar Disorder:   Mixed State    Physical Exam: Physical Exam ROS Blood pressure 109/76, pulse 74, temperature 98.4 F (36.9 C), temperature source Oral, resp. rate 18, height 5\' 6"  (1.676 m), weight 81.6 kg, last menstrual period 06/04/2023, SpO2 98%, not currently breastfeeding. Body mass index is 29.05 kg/m.   COGNITIVE FEATURES THAT CONTRIBUTE TO RISK:  None    SUICIDE RISK:   Minimal: No identifiable suicidal ideation.  Patients presenting with no risk factors but with morbid ruminations; may be classified as minimal risk based on the severity of the depressive symptoms  PLAN OF CARE:  See D/C  Summary  Emergency Contact Plan: Patient understands to call Suicide Hotline at 93, or Mobile Crisis at [phone], call 911, or go to the nearest ER as appropriate in case of thoughts of harm to self or others, or acute onset of intolerable side effects.  I certify that inpatient services furnished can reasonably be expected to improve the patient's condition.   Thalia Party, MD 06/10/2023, 10:04 AM

## 2023-06-10 NOTE — Group Note (Signed)
LCSW Group Therapy Note   Group Date: 06/10/2023 Start Time: 1305 End Time: 1400   Type of Therapy and Topic:  Group Therapy: Wellness  Participation Level:  Did Not Attend     Summary of Patient Progress:  The patient did not attend group.   Marshell Levan, LCSWA 06/10/2023  2:12 PM

## 2023-06-10 NOTE — Discharge Summary (Signed)
Physician Discharge Summary Note  Patient:  Morgan Chang is an 27 y.o., female MRN:  [id number] DOB:  May 07, 1996 Patient phone:  [phone] (home)  Patient address:   [address] [place] [address],  Total Time spent with patient: 45 minutes  Date of Admission:  06/06/2023 Date of Discharge: 06/10/2023  Reason for Admission:  Patient is a  27y.o. female who presents to the [facility] unit due to worsened depression and anxiety.   Principal Problem: Bipolar 2 disorder, major depressive episode [facility]) Discharge Diagnoses: Principal Problem:   Bipolar 2 disorder, major depressive episode (HCC)   Past Psychiatric History: ADHD INSOMNIA BIPOLAR   Past Medical History:  Past Medical History:  Diagnosis Date   Anemia 09/14/2021   Anxiety    Depression    Endometriosis    Fibroid    Frank breech presentation 10/29/2016   GERD (gastroesophageal reflux disease) 12/10/2014   Gestational diabetes    Migraines    Pregnancy induced hypertension    S/P cesarean section 10/29/2016    Past Surgical History:  Procedure Laterality Date   ADENOIDECTOMY  10/29/2007   BREAST REDUCTION SURGERY     BREAST SURGERY  08/2015   CESAREAN SECTION N/A 10/29/2016   Procedure: CESAREAN SECTION;  Surgeon: Sherian Rein, MD;  Location: [facility];  Service: Obstetrics;  Laterality: N/A;   CESAREAN SECTION N/A 07/25/2021   Procedure: Repeat CESAREAN SECTION;  Surgeon: Noland Fordyce, MD;  Location: [facility];  Service: Obstetrics;  Laterality: N/A;   Repeat C/S BTL   LAPAROSCOPY  12/10/2014   NISSEN FUNDOPLICATION     RHINOPLASTY  03/22/2010   deviated septum   TONSILLECTOMY  2017   TUBAL LIGATION  07/25/2021   Family History:  Family History  Problem Relation Age of Onset   Heart disease Mother    Hypertension Mother    Anxiety disorder Mother    Depression Mother    Alcohol abuse Mother    Arthritis Mother    Hypertension Father    Anxiety disorder Father    Depression  Father    Heart attack Father    Heart failure Father    Testicular cancer Brother    Diabetes Maternal Grandmother    Heart attack Maternal Grandfather        x 2   Family Psychiatric  History:  MATERNAL AND PATERNAL ANXIETY DEPRESSION AND ADDICTION  Social History:  Social History   Substance and Sexual Activity  Alcohol Use Yes   Comment: less than one drink per month     Social History   Substance and Sexual Activity  Drug Use No    Social History   Socioeconomic History   Marital status: Married    Spouse name: Not on file   Number of children: Not on file   Years of education: Not on file   Highest education level: Not on file  Occupational History   Occupation: Homemaker  Tobacco Use   Smoking status: Never    Passive exposure: Never   Smokeless tobacco: Never  Vaping Use   Vaping status: Never Used  Substance and Sexual Activity   Alcohol use: Yes    Comment: less than one drink per month   Drug use: No   Sexual activity: Yes    Birth control/protection: Surgical  Other Topics Concern   Not on file  Social History Narrative   Stay at home   2 children -- 39 year old son and 40 year old daughter (  2024)   Married   Social Determinants of Health   Financial Resource Strain: Not on file  Food Insecurity: No Food Insecurity (06/06/2023)   Hunger Vital Sign    Worried About Running Out of Food in the Last Year: Never true    Ran Out of Food in the Last Year: Never true  Transportation Needs: No Transportation Needs (06/06/2023)   PRAPARE - Administrator, Civil Service (Medical): No    Lack of Transportation (Non-Medical): No  Physical Activity: Not on file  Stress: Not on file  Social Connections: Unknown (04/04/2023)   Received from [facility]   Social Network    Social Network: Not on file    Hospital Course:    The patient was admitted to the inpatient psychiatric unit due to symptoms of worsened depression and anxiety. She was  restricted to ward and placed on suicide precautions. Patient was introduced to milieu activities and encouraged to participate in psycho-social groups. The plan at the time of admission was safety, stabilization and treatment.  Patient was treated with a combination of medication, therapy, and some educational activities during her hospital stay. She participated in individual and group therapy sessions, and attended various educational groups and participated in activities to help her manage her emotional and mental health. Over the course of hospitalization, for the management of depressive disorder, patient`s home medication Abilify was increased to 10mg  daily, Lamotrigine was increased  to 50mg  daily, Trintellix was decreased to 5mg  daily. We stopped Vyvanse at this point due to concern that it may worsen her mood and sleep issues. Clonazepam 0.5mg  daily continued for the management of anxiety. Patient never required as needed medications for agitation or psychosis.  At the time of discharge, patient was able to manage her symptoms of depression, anxiety. She was able to identify her triggers and develop coping skills to help her manage her emotions. She was also able to develop positive relationships with staff and peers on the unit. Patient is being discharged with a follow-up appointment/referral to an outpatient mental health provider. Patient also has access to a 24-hour crisis hotline and other community resources should she need additional support. Patient has been instructed to take her medications as prescribed, continue participating in therapy, and practice healthy lifestyle habits such as getting enough sleep and exercise. She was also reminded to reach out to her support system and the community resources available to her.  Discharge instructions:  Activity: as tolerated  Diet: heart healthy  # It is recommended to the patient to continue psychiatric medications as prescribed, after  discharge from the hospital.     # It is recommended to the patient to follow up with your outpatient psychiatric provider -instructions on appointment date, time, and address (location) are provided to you in discharge paperwork   # It was discussed with the patient, the impact of alcohol, drugs, tobacco have been there overall psychiatric and medical wellbeing, and total abstinence from substance use was recommended to the patient.   # Prescriptions provided or sent directly to preferred pharmacy at discharge. Patient agreeable to plan. Given opportunity to ask questions. Appears to feel comfortable with discharge.    # In the event of worsening symptoms, the patient is instructed to call the crisis hotline, [phone], and or go to the nearest ED for appropriate evaluation and treatment of symptoms. To follow-up with primary care provider for other medical issues, concerns and or health care needs.  Physical Findings: AIMS:  , ,  ,  ,    CIWA:    COWS:      Musculoskeletal: Strength & Muscle Tone: within normal limits Gait & Station: normal Patient leans: N/A   Psychiatric Specialty Exam:   Appearance:  CF, appearing stated age,  wearing appropriate to the situation casual/hospital clothes, with fair grooming and hygiene. Normal level of alertness and appropriate facial expression.   Attitude/Behavior: calm, cooperative, engaging with appropriate eye contact.   Motor: WNL; dyskinesias not evident. Gait appears in full range.   Speech: spontaneous, clear, coherent, normal comprehension.   Mood: euthymic, "great".   Affect: appropriately-reactive, full range.   Thought process: patient appears coherent, organized, logical, goal-directed, associations are appropriate, concrete but linear with questions   Thought content: patient denies suicidal thoughts, denies homicidal thoughts; did not express any delusions.   Thought perception: patient denies auditory and visual  hallucinations. Did not appear internally stimulated.   Cognition: patient is alert and oriented in self, place, date; with intact attention and concentration.   Insight: good, in regards of understanding of presence, nature, cause, and significance of mental or emotional problem.   Judgement: good, in regards of ability to make good decisions concerning the appropriate thing to do in various situations, including ability to form opinions regarding their mental health condition.     Psychomotor Activity  Psychomotor Activity: Psychomotor Activity: Normal     Assets  Assets: Desire for Improvement; Physical Health; Resilience; Social Support; Housing; Transportation; Communication Skills     Sleep  Sleep: Sleep: Good Number of Hours of Sleep: 8      Physical Exam: Physical Exam ROS Blood pressure 109/76, pulse 74, temperature 98.4 F (36.9 C), temperature source Oral, resp. rate 18, height 5\' 6"  (1.676 m), weight 81.6 kg, last menstrual period 06/04/2023, SpO2 98%, not currently breastfeeding. Body mass index is 29.05 kg/m.   Social History   Tobacco Use  Smoking Status Never   Passive exposure: Never  Smokeless Tobacco Never   Tobacco Cessation:  N/A, patient does not currently use tobacco products   Blood Alcohol level:  No results found for: "ETH"  Metabolic Disorder Labs:  Lab Results  Component Value Date   HGBA1C 5.2 04/20/2023   No results found for: "PROLACTIN" Lab Results  Component Value Date   CHOL 167 04/20/2023   TRIG 99.0 04/20/2023   HDL 56.30 04/20/2023   CHOLHDL 3 04/20/2023   VLDL 19.8 04/20/2023   LDLCALC 91 04/20/2023    See Psychiatric Specialty Exam and Suicide Risk Assessment completed by Attending Physician prior to discharge.  Discharge destination:  Home  Is patient on multiple antipsychotic therapies at discharge:  No   Has Patient had three or more failed trials of antipsychotic monotherapy by history:  No  Recommended  Plan for Multiple Antipsychotic Therapies: NA   Allergies as of 06/10/2023   No Known Allergies      Medication List     STOP taking these medications    lisdexamfetamine 50 MG capsule Commonly known as: Vyvanse   naproxen 500 MG tablet Commonly known as: NAPROSYN       TAKE these medications      Indication  ARIPiprazole 10 MG tablet Commonly known as: ABILIFY Take 1 tablet (10 mg total) by mouth daily. Start taking on: June 11, 2023 What changed:  medication strength how much to take  Indication: MIXED BIPOLAR AFFECTIVE DISORDER   clonazePAM 0.5 MG tablet Commonly known as: Scarlette Calico  Take 1 tablet (0.5 mg total) by mouth daily as needed for anxiety.  Indication: Feeling Anxious   lamoTRIgine 25 MG tablet Commonly known as: LaMICtal Take 2 tablets (50 mg total) by mouth daily. Start taking on: June 11, 2023 What changed: See the new instructions.  Indication: Manic-Depression   vortioxetine HBr 5 MG Tabs tablet Commonly known as: TRINTELLIX Take 1 tablet (5 mg total) by mouth daily. Start taking on: June 11, 2023 What changed:  medication strength how much to take  Indication: Major Depressive Disorder   zolpidem 10 MG tablet Commonly known as: AMBIEN Take 1 tablet (10 mg total) by mouth at bedtime as needed for sleep.  Indication: Trouble Sleeping        Follow-up Information     [facility] Follow up.   Why: Appointment with Dr. Mercy Riding is scheduled for 06/22/2023 at 4:30PM. Appointment is virtual.  Therapy appointment is Nov 6 at [facility], appointment is face to face. Contact information: [address] [place] [address]                Follow-up recommendations:   Emergency Contact Plan: Patient understands to call Suicide Hotline at 5, or Mobile Crisis at 2893627148, call 911, or go to the nearest ER as appropriate in case of thoughts of harm to self or others, or acute onset of  intolerable side effects.   Signed: Thalia Party, MD 06/10/2023, 10:06 AM

## 2023-06-13 ENCOUNTER — Telehealth (HOSPITAL_COMMUNITY): Payer: Self-pay

## 2023-06-13 DIAGNOSIS — G2571 Drug induced akathisia: Secondary | ICD-10-CM

## 2023-06-13 DIAGNOSIS — F411 Generalized anxiety disorder: Secondary | ICD-10-CM

## 2023-06-13 NOTE — Progress Notes (Shared)
Morgan Chang is a 27 y.o. female here for a follow up of a pre-existing problem.  History of Present Illness:   No chief complaint on file.   HPI  Arthralgia     Past Medical History:  Diagnosis Date   Anemia 09/14/2021   Anxiety    Depression    Endometriosis    Fibroid    Frank breech presentation 10/29/2016   GERD (gastroesophageal reflux disease) 12/10/2014   Gestational diabetes    Migraines    Pregnancy induced hypertension    S/P cesarean section 10/29/2016     Social History   Tobacco Use   Smoking status: Never    Passive exposure: Never   Smokeless tobacco: Never  Vaping Use   Vaping status: Never Used  Substance Use Topics   Alcohol use: Yes    Comment: less than one drink per month   Drug use: No    Past Surgical History:  Procedure Laterality Date   ADENOIDECTOMY  10/29/2007   BREAST REDUCTION SURGERY     BREAST SURGERY  08/2015   CESAREAN SECTION N/A 10/29/2016   Procedure: CESAREAN SECTION;  Surgeon: Sherian Rein, MD;  Location: [facility];  Service: Obstetrics;  Laterality: N/A;   CESAREAN SECTION N/A 07/25/2021   Procedure: Repeat CESAREAN SECTION;  Surgeon: Noland Fordyce, MD;  Location: [facility];  Service: Obstetrics;  Laterality: N/A;   Repeat C/S BTL   LAPAROSCOPY  12/10/2014   NISSEN FUNDOPLICATION     RHINOPLASTY  03/22/2010   deviated septum   TONSILLECTOMY  2017   TUBAL LIGATION  07/25/2021    Family History  Problem Relation Age of Onset   Heart disease Mother    Hypertension Mother    Anxiety disorder Mother    Depression Mother    Alcohol abuse Mother    Arthritis Mother    Hypertension Father    Anxiety disorder Father    Depression Father    Heart attack Father    Heart failure Father    Testicular cancer Brother    Diabetes Maternal Grandmother    Heart attack Maternal Grandfather        x 2    No Known Allergies  Current Medications:   Current Outpatient Medications:    ARIPiprazole  (ABILIFY) 10 MG tablet, Take 1 tablet (10 mg total) by mouth daily., Disp: 30 tablet, Rfl: 0   clonazePAM (KLONOPIN) 0.5 MG tablet, TAKE 1 TABLET BY MOUTH EVERY DAY AS NEEDED FOR ANXIETY, Disp: 30 tablet, Rfl: 0   lamoTRIgine (LAMICTAL) 25 MG tablet, Take 2 tablets (50 mg total) by mouth daily., Disp: 30 tablet, Rfl: 1   vortioxetine HBr (TRINTELLIX) 5 MG TABS tablet, Take 1 tablet (5 mg total) by mouth daily., Disp: 30 tablet, Rfl: 0   zolpidem (AMBIEN) 10 MG tablet, Take 1 tablet (10 mg total) by mouth at bedtime as needed for sleep., Disp: 30 tablet, Rfl: 0   Review of Systems:   ROS  Vitals:   There were no vitals filed for this visit.   There is no height or weight on file to calculate BMI.  Physical Exam:   Physical Exam  Assessment and Plan:   ***   I,Alexander Ruley,acting as a scribe for [name], PA.,have documented all relevant documentation on the behalf of [name], PA,as directed by  [name], PA while in the presence of [name], [name].   ***  [name], [name]

## 2023-06-13 NOTE — Telephone Encounter (Signed)
Patient is calling because she was inpatient recently and they had discontinued her Vyvanse. Patient is not sure why this was done but thinks she may be having withdrawals from not having it. Please review and advise, thank you

## 2023-06-14 ENCOUNTER — Other Ambulatory Visit (HOSPITAL_COMMUNITY): Payer: Self-pay

## 2023-06-14 ENCOUNTER — Telehealth (HOSPITAL_COMMUNITY): Payer: Self-pay

## 2023-06-14 ENCOUNTER — Ambulatory Visit: Admitting: Physician Assistant

## 2023-06-14 MED ORDER — PROPRANOLOL HCL 10 MG PO TABS: 10.0000 mg | ORAL_TABLET | Freq: Two times a day (BID) | ORAL | 0 refills | Status: DC | PRN

## 2023-06-14 NOTE — Telephone Encounter (Signed)
Opened in error

## 2023-06-15 ENCOUNTER — Ambulatory Visit: Admitting: Physician Assistant

## 2023-06-15 ENCOUNTER — Other Ambulatory Visit (HOSPITAL_COMMUNITY): Payer: Self-pay

## 2023-06-15 DIAGNOSIS — F3181 Bipolar II disorder: Secondary | ICD-10-CM

## 2023-06-15 MED ORDER — ZOLPIDEM TARTRATE 10 MG PO TABS: 10.0000 mg | ORAL_TABLET | Freq: Every evening | ORAL | 0 refills | Status: DC | PRN

## 2023-06-16 ENCOUNTER — Other Ambulatory Visit (HOSPITAL_COMMUNITY): Payer: Self-pay

## 2023-06-19 NOTE — Telephone Encounter (Signed)
Attempted to contact patient via telephone to check in on current status. A voicemail was left to call back. Patient is also scheduled for a follow up on 10/3.

## 2023-06-21 NOTE — Progress Notes (Unsigned)
BH MD/PA/NP OP Progress Note  06/22/2023 5:07 PM Morgan Chang  MRN:  [id number]  Visit Diagnosis:    ICD-10-CM   1. Bipolar 2 disorder (HCC)  F31.81 lamoTRIgine (LAMICTAL) 100 MG tablet    zolpidem (AMBIEN) 10 MG tablet    QUEtiapine (SEROQUEL) 100 MG tablet    2. Generalized anxiety disorder  F41.1 vortioxetine HBr (TRINTELLIX) 5 MG TABS tablet    clonazePAM (KLONOPIN) 0.5 MG tablet    3. Panic attack as reaction to stress  F41.0 clonazePAM (KLONOPIN) 0.5 MG tablet   F43.0          Assessment: Morgan Chang is a 27 y.o. female with a history of MDD, sleep difficulties and reported ADHD who presented to [facility] for initial evaluation on 12/02/2022.  During initial evaluation patient reported significant symptoms of anxiety including excessive worry control, difficulty relaxing, racing thoughts worse at night that affects sleep, restlessness, increased irritability, and fears about happening.  She also endorsed experiencing panic attacks which started in December 2023 and occur once every couple days.  During the episodes of panic patient reported symptoms of diaphoresis, increased restlessness, picking behaviors, and dissociations.  Patient also does note some depressive symptoms, that appear to be a bit better managed after starting the Trintellix.  She denied any SI or thoughts of self-harm along with any history of mania or psychosis. Patient met criteria for generalized anxiety disorder and panic attacks.   Over the course of treatment patient had an episode with concern for a manic episode in July-August of 2024.  Patient had described increased irritability, mood lability, negative self thoughts, feelings of worthlessness, insomnia, and passive SI for a 4-day period. Patient had presented to [facility] who started Abilify and patient noted some improvement in her symptoms following this. With this episode patient also meets criteria for Bipolar 2  disorder.  Morgan Chang presents for follow-up evaluation. Today, 06/22/23, patient was hospitalized in the interim due to increased depression, anxiety, and suicidal ideation. While there Abilify was titrated to 10 mg every day. Patient however developed akithisia after discharge and was first tapered to 5mg  of Abilify and started on propranolol. Symptoms however did not resolve and patient self discontinued both medications. Since then sleep has decreased, and mood has improved, with decrease in fatigue. Current presentation is concerning for progression to hypomania. Patient was counseled on the need for adequate mood stabilization. She was open to starting Seroquel and titrating Lamictal to 100 mg after having completed a 14 day course of Lamictal 50 mg. Discontinuation of Vyvanse was also recommended which patient was in agreement with. Risk and benefits of these medication adjustments were reviewed. Will continue on the remainder of her current regimen and follow up in a month.  Plan: - Continue Trintellix to 5 mg  - Continue Lamictal 50 mg daily for 14 days then increase to 100 mg daily - Discontinue Abilify 5 mg daily due to akithisia - Start Seroquel 100 mg QHS - Continue Klonopin 0.5 mg every day prn for anxiety - Discontinue Vyvanse QD managed by her PCP - Restart Ambien 10 mg at bedtime - Discontinued Lunesta 1 mg QHS - CMP, CBC, TSH, Vit D reviewed - Crisis resources reviewed - Therapy will start on 11/6 - Follow up in a month   Chief Complaint:  Chief Complaint  Patient presents with   Follow-up   HPI: Patient had reached out in the interim due to poor effect from [place] and requested  to restart Ambien.  Following this patient was hospitalized at [facility] in September due to symptoms of worsened depression and anxiety in the interim during which time Trintellix was decreased to 5 mg, Abilify increased to 10 mg, Lamictal was increased to 50 mg daily, and Vyvanse was held.  Patient  had been endorsing increased restlessness and potential akathisia following discharge leading to Abilify being decreased to 5 mg daily.  She had also been started on propranolol 10 mg 3 times daily.  On presentation today Morgan Chang reports that she is doing well. 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 She found this very overwhelming which progressed to anxiety, depression, and suicidal ideation. She did find the hospitalization to be beneficial and denies depressive or thoughts of SI since discharge. Morgan Chang did however begin to develop akithisia. Abilify was tapered to 5 mg and patient was started on Propranolol which did help decrease the restlessness, but did not completely eliminate it. Patient self discontinued Abilify around 10 days ago and stopped propranolol at the same time. Since the patient reports that she has been feeling good, but has had trouble sleeping the last 6 days. Patient reports that she is only sleeping around 2 hours a night compared to the 6 hours she had previously getting.  We discussed the poor sleep with the patient and the likely relation to the discontinuation of Abilify. As patient has bipolar 2 disorder the lack of mood stabilization from the Abilify has likely pushed her back towards hypomania. That being the case we would recommend restarting on an antipsychotic medication and titrating her mood stabilizer. Patient is starting Lamictal 50 mg in 2 days as it had been decreased while in the hospital. Due to this we will need to continue on the 50 mg dose for 2 more weeks before we can increase to 100 mg. Patient was hesitant to start on Abilify again, but was agreeable to a retrial of Seroquel. In hindsight she believes the side effect she had experienced was hair loss, though notes that this could have been related to something else. While we can not rule this out as a potential side effect it would be atypical.   Patient was  also counseled on discontinuing Vyvanse as it can also increase the risk of progression to hypomania. Patient was agreeable with this.   Past Psychiatric History: The patient has one prior psychiatric hospitalization to [facility] in September of 2024 due to worsening depression and SI. She denies any suicide attempts.  She has been connected with a couple therapists in the past, though is currently not seeing anyone after her last therapist left the practice in January.   She has tried mirtazapine, Wellbutrin, Prozac, Zoloft, Lexapro (headaches), Celexa, Trintellix, BuSpar, propranolol, Atarax (sleepy), Seroquel (hair loss), Zyprexa (oral motor issues), trazodone, Depakote, Adderall, Vyvanse, Ritalin, Klonopin, Xanax, Lunesta, Ambien  Patient currently taking Vyvanse, Trintellix, Ambien, and gabapentin.  She denies any substance use denies other than one alcoholic drink a week.   Past Medical History:  Past Medical History:  Diagnosis Date   Anemia 09/14/2021   Anxiety    Depression    Endometriosis    Fibroid    Frank breech presentation 10/29/2016   GERD (gastroesophageal reflux disease) 12/10/2014   Gestational diabetes    Migraines    Pregnancy induced hypertension    S/P cesarean section 10/29/2016    Past Surgical History:  Procedure Laterality Date  ADENOIDECTOMY  10/29/2007   BREAST REDUCTION SURGERY     BREAST SURGERY  08/2015   CESAREAN SECTION N/A 10/29/2016   Procedure: CESAREAN SECTION;  Surgeon: Sherian Rein, MD;  Location: [facility];  Service: Obstetrics;  Laterality: N/A;   CESAREAN SECTION N/A 07/25/2021   Procedure: Repeat CESAREAN SECTION;  Surgeon: Noland Fordyce, MD;  Location: [facility] LD ORS;  Service: Obstetrics;  Laterality: N/A;   Repeat C/S BTL   LAPAROSCOPY  12/10/2014   NISSEN FUNDOPLICATION     RHINOPLASTY  03/22/2010   deviated septum   TONSILLECTOMY  2017   TUBAL LIGATION  07/25/2021    Family History:  Family History  Problem Relation  Age of Onset   Heart disease Mother    Hypertension Mother    Anxiety disorder Mother    Depression Mother    Alcohol abuse Mother    Arthritis Mother    Hypertension Father    Anxiety disorder Father    Depression Father    Heart attack Father    Heart failure Father    Testicular cancer Brother    Diabetes Maternal Grandmother    Heart attack Maternal Grandfather        x 2    Social History:  Social History   Socioeconomic History   Marital status: Married    Spouse name: Not on file   Number of children: Not on file   Years of education: Not on file   Highest education level: Not on file  Occupational History   Occupation: Homemaker  Tobacco Use   Smoking status: Never    Passive exposure: Never   Smokeless tobacco: Never  Vaping Use   Vaping status: Never Used  Substance and Sexual Activity   Alcohol use: Yes    Comment: less than one drink per month   Drug use: No   Sexual activity: Yes    Birth control/protection: Surgical  Other Topics Concern   Not on file  Social History Narrative   Stay at home   2 children -- 63 year old son and 45 year old daughter (2024)   Married   Social Determinants of Corporate investment banker Strain: Not on file  Food Insecurity: No Food Insecurity (06/06/2023)   Hunger Vital Sign    Worried About Running Out of Food in the Last Year: Never true    Ran Out of Food in the Last Year: Never true  Transportation Needs: No Transportation Needs (06/06/2023)   PRAPARE - Administrator, Civil Service (Medical): No    Lack of Transportation (Non-Medical): No  Physical Activity: Not on file  Stress: Not on file  Social Connections: Unknown (04/04/2023)   Received [facility]   Social Network    Social Network: Not on file    Allergies: No Known Allergies  Current Medications: Current Outpatient Medications  Medication Sig Dispense Refill   lamoTRIgine (LAMICTAL) 100 MG tablet Take 1 tablet (100 mg total)  by mouth daily. 30 tablet 2   QUEtiapine (SEROQUEL) 100 MG tablet Take 1 tablet (100 mg total) by mouth at bedtime. 30 tablet 2   clonazePAM (KLONOPIN) 0.5 MG tablet Take 1 tablet (0.5 mg total) by mouth daily. 30 tablet 0   lamoTRIgine (LAMICTAL) 25 MG tablet Take 2 tablets (50 mg total) by mouth daily. 30 tablet 1   vortioxetine HBr (TRINTELLIX) 5 MG TABS tablet Take 1 tablet (5 mg total) by mouth daily. 30 tablet 0  zolpidem (AMBIEN) 10 MG tablet Take 1 tablet (10 mg total) by mouth at bedtime as needed for sleep. 30 tablet 0   No current facility-administered medications for this visit.     Psychiatric Specialty Exam: Review of Systems  Last menstrual period 06/04/2023, not currently breastfeeding.There is no height or weight on file to calculate BMI.  General Appearance: Fairly Groomed  Eye Contact:  Good  Speech:  Clear and Coherent and Pressured  Volume:  Normal  Mood:  Anxious and Euthymic  Affect:  Congruent  Thought Process:  Coherent and Goal Directed  Orientation:  Full (Time, Place, and Person)  Thought Content: Logical   Suicidal Thoughts:  No  Homicidal Thoughts:  No  Memory:  Immediate;   Good  Judgement:  Good  Insight:  Fair  Psychomotor Activity:  Normal  Concentration:  Concentration: Good  Recall:  Good  Fund of Knowledge: Fair  Language: Good  Akathisia:  NA    AIMS (if indicated): not done  Assets:  Communication Skills Desire for Improvement Housing Talents/Skills Transportation  ADL's:  Intact  Cognition: WNL  Sleep:  Poor   Metabolic Disorder Labs: Lab Results  Component Value Date   HGBA1C 5.2 04/20/2023   No results found for: "PROLACTIN" Lab Results  Component Value Date   CHOL 167 04/20/2023   TRIG 99.0 04/20/2023   HDL 56.30 04/20/2023   CHOLHDL 3 04/20/2023   VLDL 19.8 04/20/2023   LDLCALC 91 04/20/2023   Lab Results  Component Value Date   TSH 1.34 03/30/2022    Therapeutic Level Labs: No results found for:  "LITHIUM" No results found for: "VALPROATE" No results found for: "CBMZ"   Screenings: AUDIT    Flowsheet Row Admission (Discharged) from 06/06/2023 in [facility]  Alcohol Use Disorder Identification Test Final Score (AUDIT) 0      GAD-7    Flowsheet Row Office Visit from 04/20/2023 in [facility] Video Visit from 12/01/2022 in [facility] Video Visit from 09/30/2022 in [facility] [employer] [facility] Visit from 08/25/2022 in [facility] [employer] from 12/28/2021 in [facility]  Total GAD-7 Score 21 20 3 21 20       PHQ2-9    Flowsheet Row Office Visit from 04/20/2023 in [facility] Video Visit from 12/01/2022 in [facility] Office Visit from 08/25/2022 in [facility] [employer] [facility] Visit from 12/28/2021 in [facility] Nutrition from 04/14/2021 in [facility] Total Score 3 1 6  0 0  PHQ-9 Total Score 15 11 18 6  --      Flowsheet Row Admission (Discharged) from 06/06/2023 in [facility] INPATIENT BEH[facility]AL MEDICINE Most recent reading at 06/06/2023 11:00 PM ED from 06/06/2023 in [facility] Emergency Department at [facility] Most recent reading at 06/06/2023 10:22 AM ED from 04/15/2023 in [facility] Most recent reading at 04/15/2023  8:28 PM  C-SSRS RISK CATEGORY Low Risk Low Risk Low Risk       Collaboration of Care: Collaboration of Care: Medication Management AEB medication prescription and Psychiatrist AEB chart review  Patient/Guardian was advised Release of Information must be obtained prior to any record release in order to collaborate their care with an outside provider. Patient/Guardian was advised if they have not already done so to contact the  registration department to sign all necessary forms in order for [place]  to release information regarding their care.   Consent: Patient/Guardian gives verbal consent for treatment and assignment of benefits for services provided during this visit. Patient/Guardian expressed understanding and agreed to proceed.    Stasia Cavalier, MD 06/22/2023, 5:07 PM   Virtual Visit via Video Note  I connected with Morgan Chang on 06/22/23 at  4:30 PM EDT by a video enabled telemedicine application and verified that I am speaking with the correct person using two identifiers.  Location: Patient: Home Provider: Home Office   I discussed the limitations of evaluation and management by telemedicine and the availability of in person appointments. The patient expressed understanding and agreed to proceed.   I discussed the assessment and treatment plan with the patient. The patient was provided an opportunity to ask questions and all were answered. The patient agreed with the plan and demonstrated an understanding of the instructions.   The patient was advised to call back or seek an in-person evaluation if the symptoms worsen or if the condition fails to improve as anticipated.  I provided 25 minutes of non-face-to-face time during this encounter.   Stasia Cavalier, MD

## 2023-06-22 ENCOUNTER — Telehealth (HOSPITAL_COMMUNITY): Admitting: Psychiatry

## 2023-06-22 ENCOUNTER — Encounter (HOSPITAL_COMMUNITY): Payer: Self-pay | Admitting: Psychiatry

## 2023-06-22 DIAGNOSIS — F410 Panic disorder [episodic paroxysmal anxiety] without agoraphobia: Secondary | ICD-10-CM

## 2023-06-22 DIAGNOSIS — F430 Acute stress reaction: Secondary | ICD-10-CM | POA: Diagnosis not present

## 2023-06-22 DIAGNOSIS — F411 Generalized anxiety disorder: Secondary | ICD-10-CM | POA: Diagnosis not present

## 2023-06-22 DIAGNOSIS — F3181 Bipolar II disorder: Secondary | ICD-10-CM

## 2023-06-22 MED ORDER — LAMOTRIGINE 100 MG PO TABS
100.0000 mg | ORAL_TABLET | Freq: Every day | ORAL | 2 refills | Status: DC
Start: 2023-06-22 — End: 2023-08-01

## 2023-06-22 MED ORDER — VORTIOXETINE HBR 5 MG PO TABS: 5.0000 mg | ORAL_TABLET | Freq: Every day | ORAL | 0 refills | Status: DC

## 2023-06-22 MED ORDER — CLONAZEPAM 0.5 MG PO TABS
0.5000 mg | ORAL_TABLET | Freq: Every day | ORAL | 0 refills | Status: DC
Start: 2023-06-22 — End: 2023-08-01

## 2023-06-22 MED ORDER — QUETIAPINE FUMARATE 100 MG PO TABS: 100.0000 mg | ORAL_TABLET | Freq: Every day | ORAL | 2 refills | Status: DC

## 2023-06-22 MED ORDER — ZOLPIDEM TARTRATE 10 MG PO TABS
10.0000 mg | ORAL_TABLET | Freq: Every evening | ORAL | 0 refills | Status: DC | PRN
Start: 2023-06-22 — End: 2023-08-01

## 2023-06-23 ENCOUNTER — Ambulatory Visit: Admitting: Podiatry

## 2023-07-17 ENCOUNTER — Telehealth (HOSPITAL_COMMUNITY): Payer: Self-pay | Admitting: *Deleted

## 2023-07-17 ENCOUNTER — Other Ambulatory Visit (HOSPITAL_COMMUNITY): Payer: Self-pay | Admitting: *Deleted

## 2023-07-17 DIAGNOSIS — F3181 Bipolar II disorder: Secondary | ICD-10-CM

## 2023-07-17 MED ORDER — QUETIAPINE FUMARATE 100 MG PO TABS: 100.0000 mg | ORAL_TABLET | Freq: Every day | ORAL | 1 refills | Status: DC

## 2023-07-17 NOTE — Telephone Encounter (Signed)
I'll send it in. She may have to pay out of pocket. LVM advising her that script has been sent in and check with pharmacy regarding insurance payment.

## 2023-07-17 NOTE — Telephone Encounter (Signed)
Pt called requesting a refill of the Seroquel 100 mg 2 at bedtime. Medication last e-scribed for #30 with 2 refills on 06/22/23 during the last appointment. Pt says she dropped the bottle in the sink and has no medication left. I will advise pt that insurance will not pay for early refills. Pt has a f/u scheduled for 08/01/23. Ok to bridge?

## 2023-07-24 ENCOUNTER — Ambulatory Visit: Attending: Rheumatology | Admitting: Internal Medicine

## 2023-07-24 ENCOUNTER — Encounter: Payer: Self-pay | Admitting: Internal Medicine

## 2023-07-24 VITALS — BP 113/79 | HR 88 | Resp 14 | Ht 66.0 in | Wt 191.0 lb

## 2023-07-24 DIAGNOSIS — R6889 Other general symptoms and signs: Secondary | ICD-10-CM | POA: Insufficient documentation

## 2023-07-24 DIAGNOSIS — G4700 Insomnia, unspecified: Secondary | ICD-10-CM

## 2023-07-24 DIAGNOSIS — R768 Other specified abnormal immunological findings in serum: Secondary | ICD-10-CM | POA: Diagnosis not present

## 2023-07-24 DIAGNOSIS — M2550 Pain in unspecified joint: Secondary | ICD-10-CM | POA: Diagnosis not present

## 2023-07-24 NOTE — Progress Notes (Signed)
Office Visit Note  Patient: Morgan Chang             Date of Birth: Jan 26, 1996           MRN: [id number]             PCP: [name], PA Referring: [name], PA Visit Date: 07/24/2023   Subjective:  New Patient (Initial Visit) (Patient states she has joint pain in her hands, hips, and back. Patient states she does have a hard time losing weight. Her weight goes up and down. )   History of Present Illness: Morgan Chang is a 27 y.o. female here for evaluation of positive ANA checked associated with joint pain in multiple areas, fatigue, rashes, and weight fluctuations.  She has mostly noticed problems for approximately 1 year but gradual or no specific onset.  Before this the last major medical event was delivering her 68-year-old daughter pregnancy complicated by gestational diabetes otherwise unremarkable.  She feels pain and stiffness in multiple areas most commonly bothering her and her hands and hips and back.  Both sides involved but worse in her right arm and at her left low back and hip.  She has no prior history of injury or surgery.  Not associate with visible swelling, no overlying skin changes.  She has not noticed any new weakness or numbness.  She was evaluated by orthopedic surgery clinic due to back pain last year with chronic neck and back pain.  Cervical spine x-ray from November with mild disc height loss at C4-C5 C6-C7 and facet arthropathy at C7-T1.  Lumbar spine x-ray obtained in December without report but did not show significant degenerative disease.  She was treated with short course of oral steroid medication with temporary benefit.  She does not currently take any medicines for joint pain. Also with skin rashes often affecting the face.  Does not leave any residual scarring or discoloration.  She noticed that the general increase in hair coming out from the scalp and not in a specific distribution and not with any visible rash on her scalp. She is fatigued  daily.  She reports a lot of sleep problems both latency falling asleep and waking up throughout the night.  She has previously tried several medications for this most recently with the Remeron found to have some improvement. She denies any new oral nasal ulcers.  No palpable lymphadenopathy.  No Raynaud's symptoms.  No history of abnormal bleeding or blood clots.  Labs reviewed 04/2023 ANA 1:320 homogenous RF 10 CCP 10 ESR 3 CRP <1 Vit D 52 HCV neg  Activities of Daily Living:  Patient reports morning stiffness for 1 hour.   Patient Reports nocturnal pain.  Difficulty dressing/grooming: Denies Difficulty climbing stairs: Denies Difficulty getting out of chair: Denies Difficulty using hands for taps, buttons, cutlery, and/or writing: Denies  Review of Systems  Constitutional:  Positive for fatigue.  HENT:  Negative for mouth sores and mouth dryness.   Eyes:  Negative for dryness.  Respiratory:  Negative for shortness of breath.   Cardiovascular:  Negative for chest pain and palpitations.  Gastrointestinal:  Positive for constipation. Negative for blood in stool and diarrhea.  Endocrine: Negative for increased urination.  Genitourinary:  Negative for involuntary urination.  Musculoskeletal:  Positive for joint pain, joint pain, myalgias, muscle weakness, morning stiffness, muscle tenderness and myalgias. Negative for gait problem and joint swelling.  Skin:  Positive for rash, hair loss and sensitivity to sunlight. Negative for color change.  Allergic/Immunologic: Negative for susceptible to infections.  Neurological:  Negative for dizziness and headaches.  Hematological:  Negative for swollen glands.  Psychiatric/Behavioral:  Positive for depressed mood and sleep disturbance. The patient is nervous/anxious.     PMFS History:  Patient Active Problem List   Diagnosis Date Noted   Positive ANA (antinuclear antibody) 07/24/2023   Fluctuation of weight 07/24/2023   Arthralgia  07/24/2023   Bipolar 2 disorder, major depressive episode (HCC) 06/06/2023   Insomnia 06/06/2023   Suicidal ideation 06/06/2023   Marijuana dependence (HCC) 06/06/2023   Symptomatic mammary hypertrophy 09/06/2022   Panic attack as reaction to stress 04/08/2022   Panniculitis 12/13/2021   History of gestational hypertension 09/14/2021   Anemia 09/14/2021   Attention deficit hyperactivity disorder (ADHD), 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   Gestational diabetes mellitus (GDM), antepartum 04/14/2021   Generalized anxiety disorder 03/11/2020   Moderate episode of recurrent major depressive disorder (HCC) 03/11/2020   Sleep difficulties 03/11/2020   Mass of right breast 08/13/2018   Pain of breast 08/13/2018   External hemorrhoids 07/31/2016   Encephalopathy 05/15/2015   GERD (gastroesophageal reflux disease) 12/10/2014    Past Medical History:  Diagnosis Date   Anemia 09/14/2021   Anxiety    Depression    Endometriosis    Fibroid    Frank breech presentation 10/29/2016   GERD (gastroesophageal reflux disease) 12/10/2014   Gestational diabetes    Migraines    Pregnancy induced hypertension    S/P cesarean section 10/29/2016    Family History  Problem Relation Age of Onset   Heart disease Mother    Hypertension Mother    Anxiety disorder Mother    Depression Mother    Alcohol abuse Mother    Arthritis Mother    Hypertension Father    Anxiety disorder Father    Depression Father    Heart attack Father    Heart failure Father    Testicular cancer Brother    Diabetes Maternal Grandmother    Heart attack Maternal Grandfather        x 2   Past Surgical History:  Procedure Laterality Date   ADENOIDECTOMY  10/29/2007   BREAST REDUCTION SURGERY     BREAST SURGERY  08/2015   CESAREAN SECTION N/A 10/29/2016   Procedure: CESAREAN SECTION;  Surgeon: Sherian Rein, MD;  Location: [facility];  Service: Obstetrics;  Laterality: N/A;   CESAREAN SECTION N/A 07/25/2021    Procedure: Repeat CESAREAN SECTION;  Surgeon: Noland Fordyce, MD;  Location: [facility];  Service: Obstetrics;  Laterality: N/A;   Repeat C/S BTL   LAPAROSCOPY  12/10/2014   NISSEN FUNDOPLICATION     RHINOPLASTY  03/22/2010   deviated septum   TONSILLECTOMY  2017   TUBAL LIGATION  07/25/2021   Social History   Social History Narrative   Stay at home   2 children -- 30 year old son and 55 year old daughter (2024)   Married   Immunization History  Administered Date(s) Administered   Influenza,inj,Quad PF,6+ Mos 07/24/2020, 06/03/2022   Influenza-Unspecified 05/20/2021   PFIZER(Purple Top)SARS-COV-2 Vaccination 02/10/2020, 03/02/2020   PPD Test 09/24/2021   Tdap 06/02/2021     Objective: Vital Signs: BP 113/79 (BP Location: Right Arm, Patient Position: Sitting, Cuff Size: Normal)   Pulse 88   Resp 14   Ht 5\' 6"  (1.676 m)   Wt 191 lb (86.6 kg)   LMP 07/05/2023   Breastfeeding No   BMI 30.83 kg/m    Physical Exam  HENT:     Mouth/Throat:     Mouth: Mucous membranes are moist.     Pharynx: Oropharynx is clear.  Eyes:     Conjunctiva/sclera: Conjunctivae normal.  Cardiovascular:     Rate and Rhythm: Normal rate and regular rhythm.  Pulmonary:     Effort: Pulmonary effort is normal.     Breath sounds: Normal breath sounds.  Musculoskeletal:     Right lower leg: No edema.     Left lower leg: No edema.  Lymphadenopathy:     Cervical: No cervical adenopathy.  Skin:    General: Skin is warm and dry.     Findings: No rash.  Neurological:     Mental Status: She is alert.      Musculoskeletal Exam:  Shoulders full ROM no tenderness or swelling Elbows full ROM no tenderness or swelling Wrists full ROM no tenderness or swelling Fingers full ROM no tenderness or swelling Mild paraspinal muscle tenderness to pressure at the low back, left SI joint with soft tissue nodule on left side, lateral hip pain without any radiation down the legs Knees full ROM no tenderness or  swelling Ankles full ROM no tenderness or swelling   Investigation: No additional findings.  Imaging: No results found.  Recent Labs: Lab Results  Component Value Date   WBC 5.7 06/06/2023   HGB 13.7 06/06/2023   PLT 289 06/06/2023   NA 138 06/06/2023   K 3.9 06/06/2023   CL 106 06/06/2023   CO2 22 06/06/2023   GLUCOSE 108 (H) 06/06/2023   BUN 18 06/06/2023   CREATININE 0.91 06/06/2023   BILITOT 0.9 06/06/2023   ALKPHOS 45 06/06/2023   AST 14 (L) 06/06/2023   ALT 14 06/06/2023   PROT 7.7 06/06/2023   ALBUMIN 4.4 06/06/2023   CALCIUM 9.0 06/06/2023   GFRAA >60 10/29/2016    Speciality Comments: No specialty comments available.  Procedures:  No procedures performed Allergies: Patient has no known allergies.   Assessment / Plan:     Visit Diagnoses: Positive ANA (antinuclear antibody) - Plan: Sedimentation rate, RNP Antibody, Anti-Smith antibody, Anti-scleroderma antibody, Sjogrens syndrome-A extractable nuclear antibody, Sjogrens syndrome-B extractable nuclear antibody, Anti-DNA antibody, double-stranded, C3 and C4 Arthralgia, unspecified joint  Positive ANA at moderately high titer in a young woman but clinical criteria are nonspecific.  Checking antibody panel here today also rechecking the sedimentation rate and complements for inflammatory activity monitoring. Exam findings are just consistent for more muscular or myofascial pain around the spine and hip areas.  No peripheral joint synovitis appreciable.  If lab results are reassuring would just recommend follow-up next year for monitoring without additional new medication.  If positive serology could meet criteria for undifferentiated connective tissue disease consider trial of oral DMARD.  Insomnia, unspecified type  Chronic problem has tried several medication for sleep.  I suspect this is likely contributing at least in part to back pain especially in a myofascial pain syndrome component.  Fluctuation of  weight  By chart review weight is up 15 pounds compared to clinic appointment at the beginning of August with PCP office.  She is concerned about inflammatory disease association this would be less typical.  Had previously normal TSH and reported symptoms of heat intolerance do not sound suggestive for hypothyroidism.  She is on multiple psychiatric medications for bipolar disorder that may be associated with significant weight change.  Orders: Orders Placed This Encounter  Procedures   Sedimentation rate   RNP Antibody   Anti-Smith  antibody   Anti-scleroderma antibody   Sjogrens syndrome-A extractable nuclear antibody   Sjogrens syndrome-B extractable nuclear antibody   Anti-DNA antibody, double-stranded   C3 and C4   No orders of the defined types were placed in this encounter.    Follow-Up Instructions: Return in about 6 months (around 01/21/2024) for New pt +ANA obs f/u 6mos.   Fuller Plan, MD  Note - This record has been created using [employer].  Chart creation errors have been sought, but may not always  have been located. Such creation errors do not reflect on  the standard of medical care.

## 2023-07-25 LAB — C3 AND C4
C3 Complement: 123 mg/dL (ref 83–193)
C4 Complement: 19 mg/dL (ref 15–57)

## 2023-07-25 LAB — RNP ANTIBODY: Ribonucleic Protein(ENA) Antibody, IgG: <1 AI

## 2023-07-25 LAB — ANTI-SCLERODERMA ANTIBODY: Scleroderma (Scl-70) (ENA) Antibody, IgG: <1 AI

## 2023-07-25 LAB — ANTI-SMITH ANTIBODY: ENA SM Ab Ser-aCnc: <1 AI

## 2023-07-25 LAB — SJOGRENS SYNDROME-B EXTRACTABLE NUCLEAR ANTIBODY: SSB (La) (ENA) Antibody, IgG: <1 AI

## 2023-07-25 LAB — ANTI-DNA ANTIBODY, DOUBLE-STRANDED: ds DNA Ab: 2 [IU]/mL

## 2023-07-25 LAB — SJOGRENS SYNDROME-A EXTRACTABLE NUCLEAR ANTIBODY: SSA (Ro) (ENA) Antibody, IgG: <1 AI

## 2023-07-25 LAB — SEDIMENTATION RATE: Sed Rate: 2 mm/h (ref 0–20)

## 2023-07-26 ENCOUNTER — Ambulatory Visit (HOSPITAL_COMMUNITY): Admitting: Clinical

## 2023-07-31 NOTE — Progress Notes (Unsigned)
BH MD/PA/NP OP Progress Note  08/01/2023 9:29 AM Morgan Chang  MRN:  [id number]  Visit Diagnosis:    ICD-10-CM   1. Bipolar 2 disorder (HCC)  F31.81 lamoTRIgine (LAMICTAL) 100 MG tablet    zolpidem (AMBIEN) 10 MG tablet    risperiDONE (RISPERDAL) 1 MG tablet    2. Generalized anxiety disorder  F41.1 clonazePAM (KLONOPIN) 0.5 MG tablet    3. Panic attack as reaction to stress  F41.0 clonazePAM (KLONOPIN) 0.5 MG tablet   F43.0       Assessment: Morgan Chang is a 27 y.o. female with a history of MDD, sleep difficulties and reported ADHD who presented to [facility] at [facility] for initial evaluation on 12/02/2022.  During initial evaluation patient reported significant symptoms of anxiety including excessive worry control, difficulty relaxing, racing thoughts worse at night that affects sleep, restlessness, increased irritability, and fears about happening.  She also endorsed experiencing panic attacks which started in December 2023 and occur once every couple days.  During the episodes of panic patient reported symptoms of diaphoresis, increased restlessness, picking behaviors, and dissociations.  Patient also does note some depressive symptoms, that appear to be a bit better managed after starting the Trintellix.  She denied any SI or thoughts of self-harm along with any history of mania or psychosis. Patient met criteria for generalized anxiety disorder and panic attacks.   Over the course of treatment patient had an episode with concern for a manic episode in July-August of 2024.  Patient had described increased irritability, mood lability, negative self thoughts, feelings of worthlessness, insomnia, and passive SI for a 4-day period. Patient had presented to [facility] who started Abilify and patient noted some improvement in her symptoms following this. With this episode patient also meets criteria for Bipolar 2 disorder.  Morgan Chang presents for follow-up  evaluation. Today, 08/01/23, patient reports improvement in her mood stability and a decrease in anxiety.  She notes benefit from the titration of Lamictal and initiation of Seroquel.  Patient did experience hair loss following these medication changes.  While atypical she has experienced this in the past after starting Seroquel making it likely causative agent.  That being the case we will discontinue Seroquel today and start Risperdal.  We will also titrate Lamictal to 150 mg daily.  Risk and benefits of both were reviewed.  We will continue on her current regimen and follow-up in a month.  Once patient stabilizes can discuss whether Trintellix is still needed.  Plan: - Continue Trintellix 5 mg daily - Increase Lamictal to 150 mg daily - Discontinue Seroquel 100 mg at bedtime, due to hair loss - Continue Klonopin 0.5 mg every day prn for anxiety - Discontinued Vyvanse QD managed by her PCP - Restart Ambien 10 mg at bedtime - CMP, CBC, TSH, Vit D reviewed - Crisis resources reviewed - Therapy referral  - Follow up in a month   Chief Complaint:  Chief Complaint  Patient presents with   Follow-up   HPI: Patient presents reporting that she is doing pretty good.  Her mood has increased instability with less fluctuations since the titration of Lamictal and initiation of Seroquel.  She has been sleeping better around 8 hours of night.  Patient did note feeling like 10-11 will be better which we discussed.  Reviewed that while she may need slightly more than a 1011 hrs. a night might be a bit excessive.  Shortly after starting Seroquel however patient did begin experiencing large amounts of  hair loss again.  While this is not a typical side effect of Seroquel it is the second time that she has experienced it making it likely cause.  That being the case we discussed options going forward.  The does feel like the negative side effects outweighed the benefit of the medication.  Since she is also on  Lamictal for mood stabilization we discussed continuing to titrate this and trying risperidone instead for adjunct mood stabilization.  Risk and benefits of this were discussed.  We will also titrate Lamictal.  Patient notes that her anxiety has also been improving and she is only using Klonopin every 3 to 4 days. The patient did not pick up Ambien in the interim due to pharmacy reporting that he did not receive the prescription.  We discussed restarting this medication today and have a goal of discontinuing Klonopin in the future.  Past Psychiatric History: The patient has one prior psychiatric hospitalization to [facility] in September of 2024 due to worsening depression and SI. She denies any suicide attempts.  She has been connected with a couple therapists in the past, though is currently not seeing anyone after her last therapist left the practice in January.   She has tried mirtazapine, Wellbutrin, Prozac, Zoloft, Lexapro (headaches), Celexa, Trintellix, BuSpar, propranolol, Atarax (sleepy), Seroquel (hair loss), Zyprexa (oral motor issues), trazodone, Depakote, Adderall, Vyvanse, Ritalin, Klonopin, Xanax, Lunesta, Ambien  Patient currently taking Vyvanse, Trintellix, Ambien, and gabapentin.  She denies any substance use denies other than one alcoholic drink a week.   Past Medical History:  Past Medical History:  Diagnosis Date   Anemia 09/14/2021   Anxiety    Depression    Endometriosis    Fibroid    Frank breech presentation 10/29/2016   GERD (gastroesophageal reflux disease) 12/10/2014   Gestational diabetes    Migraines    Pregnancy induced hypertension    S/P cesarean section 10/29/2016    Past Surgical History:  Procedure Laterality Date   ADENOIDECTOMY  10/29/2007   BREAST REDUCTION SURGERY     BREAST SURGERY  08/2015   CESAREAN SECTION N/A 10/29/2016   Procedure: CESAREAN SECTION;  Surgeon: Sherian Rein, MD;  Location: [facility];  Service: Obstetrics;   Laterality: N/A;   CESAREAN SECTION N/A 07/25/2021   Procedure: Repeat CESAREAN SECTION;  Surgeon: Noland Fordyce, MD;  Location: [facility] LD [facility]S;  Service: Obstetrics;  Laterality: N/A;   Repeat C/S BTL   LAPAROSCOPY  12/10/2014   NISSEN FUNDOPLICATION     RHINOPLASTY  03/22/2010   deviated septum   TONSILLECTOMY  2017   TUBAL LIGATION  07/25/2021    Family History:  Family History  Problem Relation Age of Onset   Heart disease Mother    Hypertension Mother    Anxiety disorder Mother    Depression Mother    Alcohol abuse Mother    Arthritis Mother    Hypertension Father    Anxiety disorder Father    Depression Father    Heart attack Father    Heart failure Father    Testicular cancer Brother    Diabetes Maternal Grandmother    Heart attack Maternal Grandfather        x 2    Social History:  Social History   Socioeconomic History   Marital status: Married    Spouse name: Not on file   Number of children: Not on file   Years of education: Not on file   Highest education level: Not on file  Occupational History   Occupation: Homemaker  Tobacco Use   Smoking status: Never    Passive exposure: Never   Smokeless tobacco: Never  Vaping Use   Vaping status: Never Used  Substance and Sexual Activity   Alcohol use: Yes    Comment: less than one drink per month   Drug use: No   Sexual activity: Yes    Birth control/protection: Surgical  Other Topics Concern   Not on file  Social History Narrative   Stay at home   2 children -- 24 year old son and 46 year old daughter (2024)   Married   Social Determinants of Corporate investment banker Strain: Not on file  Food Insecurity: No Food Insecurity (06/06/2023)   Hunger Vital Sign    Worried About Running Out of Food in the Last Year: Never true    Ran Out of Food in the Last Year: Never true  Transportation Needs: No Transportation Needs (06/06/2023)   PRAPARE - Administrator, Civil Service (Medical): No     Lack of Transportation (Non-Medical): No  Physical Activity: Not on file  Stress: Not on file  Social Connections: Unknown (04/04/2023)   [facility] from [facility]   Social Network    Social Network: Not on file    Allergies: No Known Allergies  Current Medications: Current Outpatient Medications  Medication Sig Dispense Refill   risperiDONE (RISPERDAL) 1 MG tablet Take 1 tablet (1 mg total) by mouth at bedtime. 30 tablet 2   [START ON 08/08/2023] clonazePAM (KLONOPIN) 0.5 MG tablet Take 1 tablet (0.5 mg total) by mouth daily. 30 tablet 0   lamoTRIgine (LAMICTAL) 100 MG tablet Take 1 tablet (100 mg total) by mouth daily. 45 tablet 2   vortioxetine HBr (TRINTELLIX) 5 MG TABS tablet Take 1 tablet (5 mg total) by mouth daily. 30 tablet 0   zolpidem (AMBIEN) 10 MG tablet Take 1 tablet (10 mg total) by mouth at bedtime as needed for sleep. 30 tablet 1   No current facility-administered medications for this visit.     Psychiatric Specialty Exam: Review of Systems  Last menstrual period 07/05/2023.There is no height or weight on file to calculate BMI.  General Appearance: Fairly Groomed  Eye Contact:  Good  Speech:  Clear and Coherent and Pressured  Volume:  Normal  Mood:  Euthymic  Affect:  Congruent  Thought Process:  Coherent and Goal Directed  Orientation:  Full (Time, Place, and Person)  Thought Content: Logical   Suicidal Thoughts:  No  Homicidal Thoughts:  No  Memory:  Immediate;   Good  Judgement:  Good  Insight:  Fair  Psychomotor Activity:  Normal  Concentration:  Concentration: Good  Recall:  Good  Fund of Knowledge: Fair  Language: Good  Akathisia:  NA    AIMS (if indicated): not done  Assets:  Communication Skills Desire for Improvement Housing Talents/Skills Transportation  ADL's:  Intact  Cognition: WNL  Sleep:  Good   Metabolic Disorder Labs: Lab Results  Component Value Date   HGBA1C 5.2 04/20/2023   No results found for: "PROLACTIN" Lab  Results  Component Value Date   CHOL 167 04/20/2023   TRIG 99.0 04/20/2023   HDL 56.30 04/20/2023   CHOLHDL 3 04/20/2023   VLDL 19.8 04/20/2023   LDLCALC 91 04/20/2023   Lab Results  Component Value Date   TSH 1.34 03/30/2022    Therapeutic Level Labs: No results found for: "LITHIUM" No results found  for: "VALPROATE" No results found for: "CBMZ"   Screenings: AUDIT    Flowsheet Row Admission (Discharged) from 06/06/2023 in [facility]INE  Alcohol Use Disorder Identification Test Final Score (AUDIT) 0      GAD-7    Flowsheet Row Office Visit from 04/20/2023 in [facility] Video Visit from 12/01/2022 [facility] Video Visit from 09/30/2022 in [facility] [employer] Visit from 08[facility]25/2022 in [facility] [employer] from 12/28/2021 in [facility]  Total GAD-7 Score 21 20 3 21 20       PHQ2-9    Flowsheet Row Office Visit from 04/20/2023 in [facility] Video Visit from 12/01/2022 in [facility] Office Visit from 08/25/2022 in [facility] [employer] from 12/28/2021 in [facility] Nutrition from 04/14/2021 in [facility]  - [facility] [place]  PHQ-2 Total Score 3 1 6  0 0  PHQ-9 Total Score 15 11 18 6  --      Flowsheet Row Admission (Discharged) from 06/06/2023 in [facility] INPATIENT [facility] MEDICINE Most recent reading at 06/06/2023 11:00 PM ED from 06/06/2023 in [facility] Emergency Department at [facility] Most recent reading at 06/06/2023 10:22 AM ED from 04/15/2023 in [facility] Most recent reading at 04/15/2023  8:28 PM  C-SSRS RISK CATEGORY Low Risk Low Risk Low Risk       Collaboration of Care: Collaboration of Care: Medication Management AEB  medication prescription and Other provider involved in patient's care AEB rheumatology chart review  Patient/Guardian was advised Release of Information must be obtained prior to any record release in order to collaborate their care with an outside provider. Patient/Guardian was advised if they have not already done so to contact the registration department to sign all necessary forms in order for [place] to release information regarding their care.   Consent: Patient/Guardian gives verbal consent for treatment and assignment of benefits for services provided during this visit. Patient/Guardian expressed understanding and agreed to proceed.    Stasia Cavalier, MD 08/01/2023, 9:29 AM   Virtual Visit via Video Note  I connected with [name] on 08/01/23 at  9:00 AM EST by a video enabled telemedicine application and verified that I am speaking with the correct person using two identifiers.  Location: Patient: Home Provider: Home Office   I discussed the limitations of evaluation and management by telemedicine and the availability of in person appointments. The patient expressed understanding and agreed to proceed.   I discussed the assessment and treatment plan with the patient. The patient was provided an opportunity to ask questions and all were answered. The patient agreed with the plan and demonstrated an understanding of the instructions.   The patient was advised to call back or seek an in-person evaluation if the symptoms worsen or if the condition fails to improve as anticipated.  I provided 25 minutes of non-face-to-face time during this encounter.   Stasia Cavalier, MD

## 2023-08-01 ENCOUNTER — Telehealth (HOSPITAL_BASED_OUTPATIENT_CLINIC_OR_DEPARTMENT_OTHER): Admitting: Psychiatry

## 2023-08-01 ENCOUNTER — Encounter (HOSPITAL_COMMUNITY): Payer: Self-pay | Admitting: Psychiatry

## 2023-08-01 DIAGNOSIS — F3181 Bipolar II disorder: Secondary | ICD-10-CM

## 2023-08-01 DIAGNOSIS — F410 Panic disorder [episodic paroxysmal anxiety] without agoraphobia: Secondary | ICD-10-CM | POA: Diagnosis not present

## 2023-08-01 DIAGNOSIS — F430 Acute stress reaction: Secondary | ICD-10-CM

## 2023-08-01 DIAGNOSIS — F411 Generalized anxiety disorder: Secondary | ICD-10-CM

## 2023-08-01 MED ORDER — LAMOTRIGINE 100 MG PO TABS: 100.0000 mg | ORAL_TABLET | Freq: Every day | ORAL | 2 refills | Status: DC

## 2023-08-01 MED ORDER — ZOLPIDEM TARTRATE 10 MG PO TABS: 10.0000 mg | ORAL_TABLET | Freq: Every evening | ORAL | 1 refills | Status: DC | PRN

## 2023-08-01 MED ORDER — RISPERIDONE 1 MG PO TABS: 1.0000 mg | ORAL_TABLET | Freq: Every day | ORAL | 2 refills | Status: DC

## 2023-08-01 MED ORDER — CLONAZEPAM 0.5 MG PO TABS: 0.5000 mg | ORAL_TABLET | Freq: Every day | ORAL | 0 refills | Status: DC

## 2023-08-01 MED ORDER — VORTIOXETINE HBR 5 MG PO TABS: 5.0000 mg | ORAL_TABLET | Freq: Every day | ORAL | 2 refills | Status: DC

## 2023-08-02 ENCOUNTER — Other Ambulatory Visit (HOSPITAL_COMMUNITY): Payer: Self-pay | Admitting: Psychiatry

## 2023-08-05 ENCOUNTER — Ambulatory Visit

## 2023-08-07 ENCOUNTER — Other Ambulatory Visit (HOSPITAL_COMMUNITY): Payer: Self-pay

## 2023-08-07 ENCOUNTER — Other Ambulatory Visit: Payer: Self-pay | Admitting: Physician Assistant

## 2023-08-08 ENCOUNTER — Other Ambulatory Visit (HOSPITAL_COMMUNITY): Payer: Self-pay

## 2023-08-28 NOTE — Progress Notes (Unsigned)
BH MD/PA/NP OP Progress Note  08/28/2023 9:41 AM Morgan Chang  MRN:  [id number]  Visit Diagnosis:  No diagnosis found.   Assessment: Morgan Chang is a 27 y.o. female with a history of MDD, sleep difficulties and reported ADHD who presented to [facility] for initial evaluation on 12/02/2022.  During initial evaluation patient reported significant symptoms of anxiety including excessive worry control, difficulty relaxing, racing thoughts worse at night that affects sleep, restlessness, increased irritability, and fears about happening.  She also endorsed experiencing panic attacks which started in December 2023 and occur once every couple days.  During the episodes of panic patient reported symptoms of diaphoresis, increased restlessness, picking behaviors, and dissociations.  Patient also does note some depressive symptoms, that appear to be a bit better managed after starting the Trintellix.  She denied any SI or thoughts of self-harm along with any history of mania or psychosis. Patient met criteria for generalized anxiety disorder and panic attacks.   Over the course of treatment patient had an episode with concern for a manic episode in July-August of 2024.  Patient had described increased irritability, mood lability, negative self thoughts, feelings of worthlessness, insomnia, and passive SI for a 4-day period. Patient had presented to [facility] who started Abilify and patient noted some improvement in her symptoms following this. With this episode patient also meets criteria for Bipolar 2 disorder.  Morgan Chang presents for follow-up evaluation. Today, 08/28/23, patient reports   improvement in her mood stability and a decrease in anxiety.  She notes benefit from the titration of Lamictal and initiation of Seroquel.  Patient did experience hair loss following these medication changes.  While atypical she has experienced this in the past after starting  Seroquel making it likely causative agent.  That being the case we will discontinue Seroquel today and start Risperdal.  We will also titrate Lamictal to 150 mg daily.  Risk and benefits of both were reviewed.  We will continue on her current regimen and follow-up in a month.  Once patient stabilizes can discuss whether Trintellix is still needed.  Plan: - Continue Trintellix 5 mg daily - Increase Lamictal to 150 mg daily - Discontinue Seroquel 100 mg at bedtime, due to hair loss - Continue Klonopin 0.5 mg every day prn for anxiety - Discontinued Vyvanse QD managed by her PCP - Restart Ambien 10 mg at bedtime - CMP, CBC, TSH, Vit D reviewed - Crisis resources reviewed - Therapy referral  - Follow up in a month   Chief Complaint:  No chief complaint on file.  HPI: Patient presents reporting that    she is doing pretty good.  Her mood has increased instability with less fluctuations since the titration of Lamictal and initiation of Seroquel.  She has been sleeping better around 8 hours of night.  Patient did note feeling like 10-11 will be better which we discussed.  Reviewed that while she may need slightly more than a 1011 hrs. a night might be a bit excessive.  Shortly after starting Seroquel however patient did begin experiencing large amounts of hair loss again.  While this is not a typical side effect of Seroquel it is the second time that she has experienced it making it likely cause.  That being the case we discussed options going forward.  The does feel like the negative side effects outweighed the benefit of the medication.  Since she is also on Lamictal for mood stabilization we discussed continuing to titrate  this and trying risperidone instead for adjunct mood stabilization.  Risk and benefits of this were discussed.  We will also titrate Lamictal.  Patient notes that her anxiety has also been improving and she is only using Klonopin every 3 to 4 days. The patient did not pick up  Ambien in the interim due to pharmacy reporting that he did not receive the prescription.  We discussed restarting this medication today and have a goal of discontinuing Klonopin in the future.  Past Psychiatric History: The patient has one prior psychiatric hospitalization to [facility] in September of 2024 due to worsening depression and SI. She denies any suicide attempts.  She has been connected with a couple therapists in the past, though is currently not seeing anyone after her last therapist left the practice in January.   She has tried mirtazapine, Wellbutrin, Prozac, Zoloft, Lexapro (headaches), Celexa, Trintellix, BuSpar, propranolol, Atarax (sleepy), Seroquel (hair loss), Zyprexa (oral motor issues), trazodone, Depakote, Adderall, Vyvanse, Ritalin, Klonopin, Xanax, Lunesta, Ambien  Patient currently taking Vyvanse, Trintellix, Ambien, and gabapentin.  She denies any substance use denies other than one alcoholic drink a week.   Past Medical History:  Past Medical History:  Diagnosis Date   Anemia 09/14/2021   Anxiety    Depression    Endometriosis    Fibroid    Frank breech presentation 10/29/2016   GERD (gastroesophageal reflux disease) 12/10/2014   Gestational diabetes    Migraines    Pregnancy induced hypertension    S/P cesarean section 10/29/2016    Past Surgical History:  Procedure Laterality Date   ADENOIDECTOMY  10/29/2007   BREAST REDUCTION SURGERY     BREAST SURGERY  08/2015   CESAREAN SECTION N/A 10/29/2016   Procedure: CESAREAN SECTION;  Surgeon: Sherian Rein, MD;  Location: [facility];  Service: Obstetrics;  Laterality: N/A;   CESAREAN SECTION N/A 07/25/2021   Procedure: Repeat CESAREAN SECTION;  Surgeon: Noland Fordyce, MD;  Location: [facility] LD ORS;  Service: Obstetrics;  Laterality: N/A;   Repeat C/S BTL   LAPAROSCOPY  12/10/2014   NISSEN FUNDOPLICATION     RHINOPLASTY  03/22/2010   deviated septum   TONSILLECTOMY  2017   TUBAL LIGATION  07/25/2021     Family History:  Family History  Problem Relation Age of Onset   Heart disease Mother    Hypertension Mother    Anxiety disorder Mother    Depression Mother    Alcohol abuse Mother    Arthritis Mother    Hypertension Father    Anxiety disorder Father    Depression Father    Heart attack Father    Heart failure Father    Testicular cancer Brother    Diabetes Maternal Grandmother    Heart attack Maternal Grandfather        x 2    Social History:  Social History   Socioeconomic History   Marital status: Married    Spouse name: Not on file   Number of children: Not on file   Years of education: Not on file   Highest education level: Not on file  Occupational History   Occupation: Homemaker  Tobacco Use   Smoking status: Never    Passive exposure: Never   Smokeless tobacco: Never  Vaping Use   Vaping status: Never Used  Substance and Sexual Activity   Alcohol use: Yes    Comment: less than one drink per month   Drug use: No   Sexual activity: Yes    Birth  control/protection: Surgical  Other Topics Concern   Not on file  Social History Narrative   Stay at home   2 children -- 34 year old son and 32 year old daughter (2024)   Married   Social Determinants of Corporate investment banker Strain: Not on file  Food Insecurity: No Food Insecurity (06/06/2023)   Hunger Vital Sign    Worried About Running Out of Food in the Last Year: Never true    Ran Out of Food in the Last Year: Never true  Transportation Needs: No Transportation Needs (06/06/2023)   PRAPARE - Administrator, Civil Service (Medical): No    Lack of Transportation (Non-Medical): No  Physical Activity: Not on file  Stress: Not on file  Social Connections: Unknown (04/04/2023)   Received from [facility]   Social Network    Social Network: Not on file    Allergies: No Known Allergies  Current Medications: Current Outpatient Medications  Medication Sig Dispense Refill    clonazePAM (KLONOPIN) 0.5 MG tablet Take 1 tablet (0.5 mg total) by mouth daily. 30 tablet 0   lamoTRIgine (LAMICTAL) 100 MG tablet Take 1 tablet (100 mg total) by mouth daily. 45 tablet 2   risperiDONE (RISPERDAL) 1 MG tablet Take 1 tablet (1 mg total) by mouth at bedtime. 30 tablet 2   vortioxetine HBr (TRINTELLIX) 5 MG TABS tablet Take 1 tablet (5 mg total) by mouth daily. 30 tablet 2   zolpidem (AMBIEN) 10 MG tablet Take 1 tablet (10 mg total) by mouth at bedtime as needed for sleep. 30 tablet 1   No current facility-administered medications for this visit.     Psychiatric Specialty Exam: Review of Systems  There were no vitals taken for this visit.There is no height or weight on file to calculate BMI.  General Appearance: Fairly Groomed  Eye Contact:  Good  Speech:  Clear and Coherent and Pressured  Volume:  Normal  Mood:  Euthymic  Affect:  Congruent  Thought Process:  Coherent and Goal Directed  Orientation:  Full (Time, Place, and Person)  Thought Content: Logical   Suicidal Thoughts:  No  Homicidal Thoughts:  No  Memory:  Immediate;   Good  Judgement:  Good  Insight:  Fair  Psychomotor Activity:  Normal  Concentration:  Concentration: Good  Recall:  Good  Fund of Knowledge: Fair  Language: Good  Akathisia:  NA    AIMS (if indicated): not done  Assets:  Communication Skills Desire for Improvement Housing Talents/Skills Transportation  ADL's:  Intact  Cognition: WNL  Sleep:  Good   Metabolic Disorder Labs: Lab Results  Component Value Date   HGBA1C 5.2 04/20/2023   No results found for: "PROLACTIN" Lab Results  Component Value Date   CHOL 167 04/20/2023   TRIG 99.0 04/20/2023   HDL 56.30 04/20/2023   CHOLHDL 3 04/20/2023   VLDL 19.8 04/20/2023   LDLCALC 91 04/20/2023   Lab Results  Component Value Date   TSH 1.34 03/30/2022    Therapeutic Level Labs: No results found for: "LITHIUM" No results found for: "VALPROATE" No results found for:  "CBMZ"   Screenings: AUDIT    Flowsheet Row Admission (Discharged) from 06/06/2023 in [facility] INPATIENT [facility]AV[facility]AL MEDICINE  Alcohol Use Disorder Identification Test Final Score (AUDIT) 0      GAD-7    Flowsheet Row Office Visit from 04/20/2023 in [facility] [place] Video Visit from [facility] Video Visit from 09/30/2022 in [facility] [place] [facility] Visit from 08/25/2022 [facility] [place] [employer] Visit from 12/28/2021 in [facility] [place]  Total GAD-7 Score 21 20 3 21 20       PHQ2-9    Flowsheet Row Office Visit from 04/20/2023 in [facility] [place] Video Visit from 12/01/2022 [facility] Office Visit from 08/25/2022 in [facility] [place] Office Visit from 12/28/2021 in [facility] [place] Nutrition from 04/14/2021 in [facility]  - [facility] [place]  PHQ-2 Total Score 3 1 6  0 0  PHQ-9 Total Score 15 11 18 6  --      Flowsheet Row Admission (Discharged) from 06/06/2023 in [facility] INPATIENT BEHAVIORAL MEDICINE Most recent reading at 06/06/2023 11:00 PM ED from 06/06/2023 in [facility] Emergency Department at [facility] Most recent reading at 06/06/2023 10:22 AM ED from 04/15/2023 in [facility] Most recent reading at 04/15/2023  8:28 PM  C-SSRS RISK CATEGORY Low Risk Low Risk Low Risk       Collaboration of Care: Collaboration of Care: Medication Management AEB medication prescription and Other provider involved in patient's care AEB urgent care chart review  Patient/Guardian was advised Release of Information must be obtained prior to any record release in order to collaborate their care with an outside provider.  Patient/Guardian was advised if they have not already done so to contact the registration department to sign all necessary forms in order for [place] to release information regarding their care.   Consent: Patient/Guardian gives verbal consent for treatment and assignment of benefits for services provided during this visit. Patient/Guardian expressed understanding and agreed to proceed.    Stasia Cavalier, MD 08/28/2023, 9:41 AM   Virtual Visit via Video Note  I connected with Morgan Chang on 08/28/23 at  9:00 AM EST by a video enabled telemedicine application and verified that I am speaking with the correct person using two identifiers.  Location: Patient: Home Provider: Home Office   I discussed the limitations of evaluation and management by telemedicine and the availability of in person appointments. The patient expressed understanding and agreed to proceed.   I discussed the assessment and treatment plan with the patient. The patient was provided an opportunity to ask questions and all were answered. The patient agreed with the plan and demonstrated an understanding of the instructions.   The patient was advised to call back or seek an in-person evaluation if the symptoms worsen or if the condition fails to improve as anticipated.  I provided 25 minutes of non-face-to-face time during this encounter.   Stasia Cavalier, MD

## 2023-08-31 ENCOUNTER — Encounter (HOSPITAL_COMMUNITY): Admitting: Psychiatry

## 2023-08-31 ENCOUNTER — Encounter (HOSPITAL_COMMUNITY): Payer: Self-pay

## 2023-08-31 NOTE — Progress Notes (Signed)
This encounter was created in error - please disregard.

## 2023-09-11 IMAGING — US US RENAL
1 series · 14 of 25 positions shown · non-contrast
Comparison: None.

CLINICAL DATA: Flank pain

EXAM:
RENAL / URINARY TRACT ULTRASOUND COMPLETE

[Series 1: us renal · 0.28mm/px · 14 of 39 slices shown]
[im 1/39]
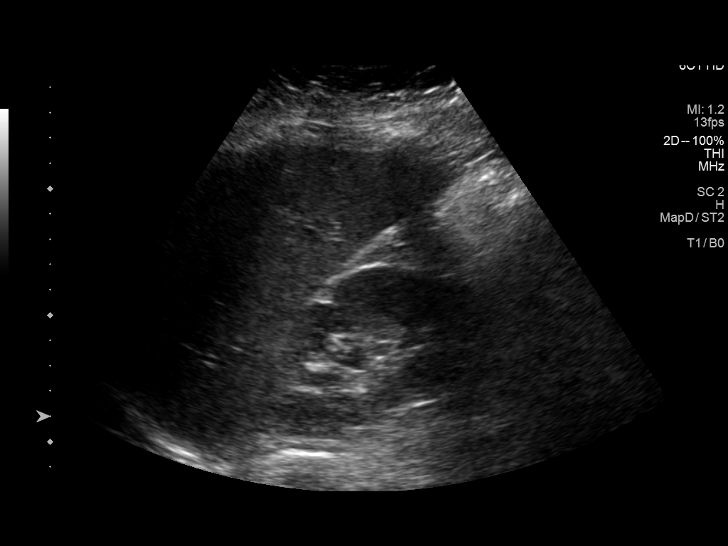
[im 4/39]
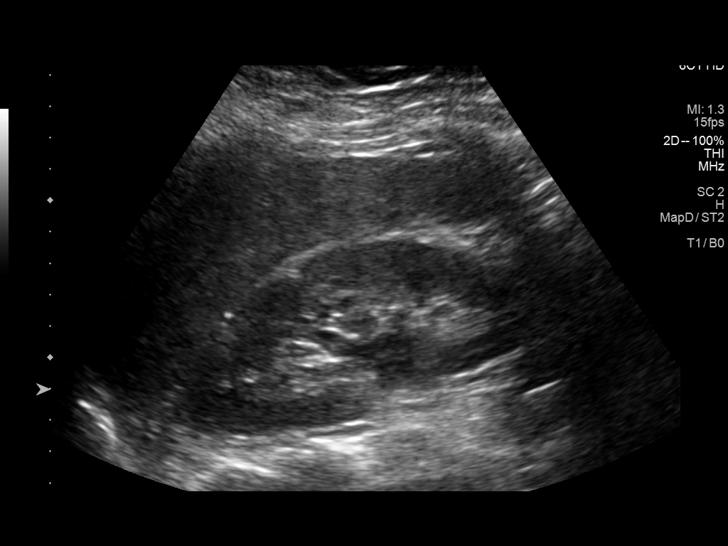
[im 7/39]
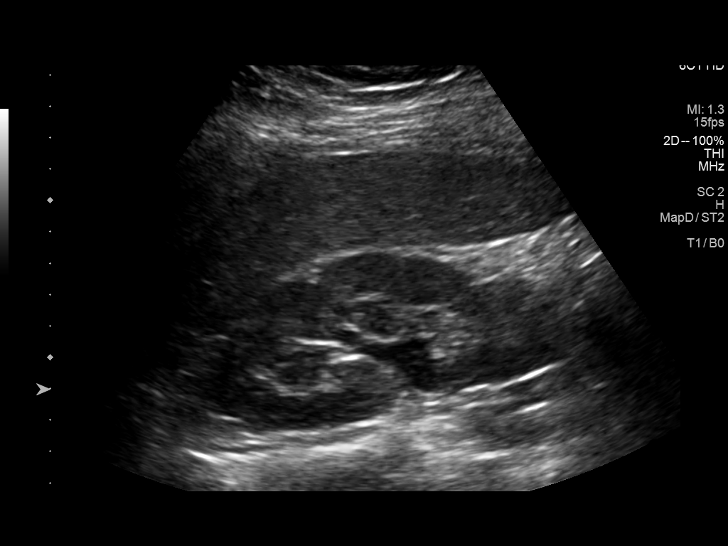
[im 10/39]
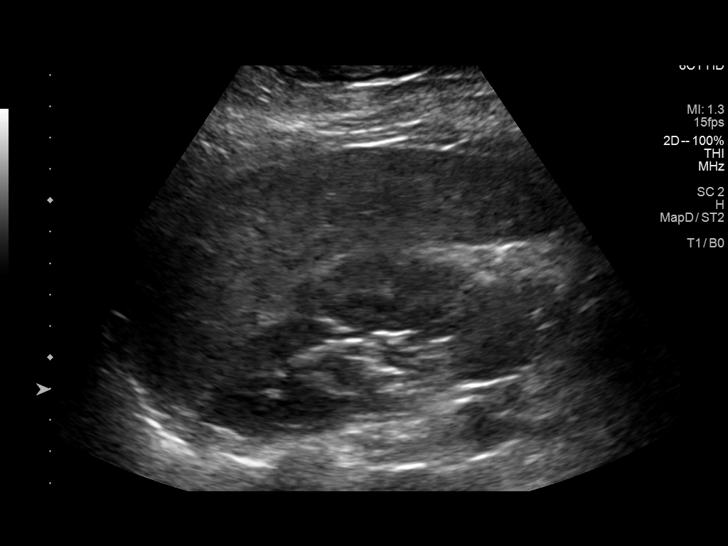
[im 13/39]
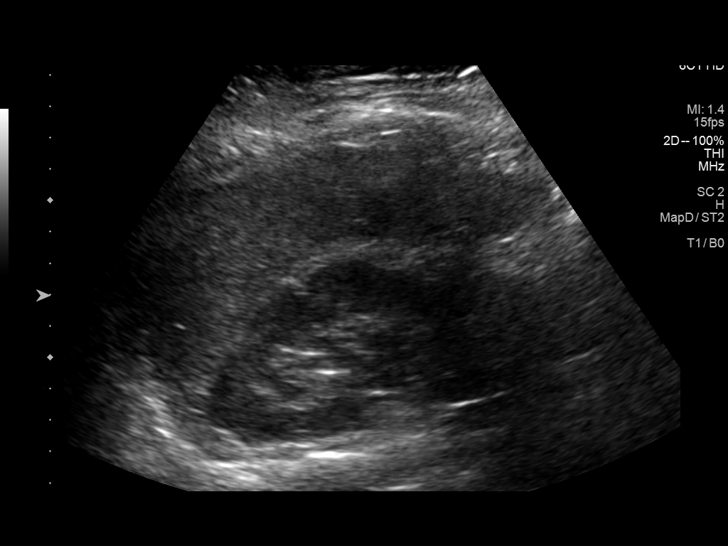
[im 15/39]
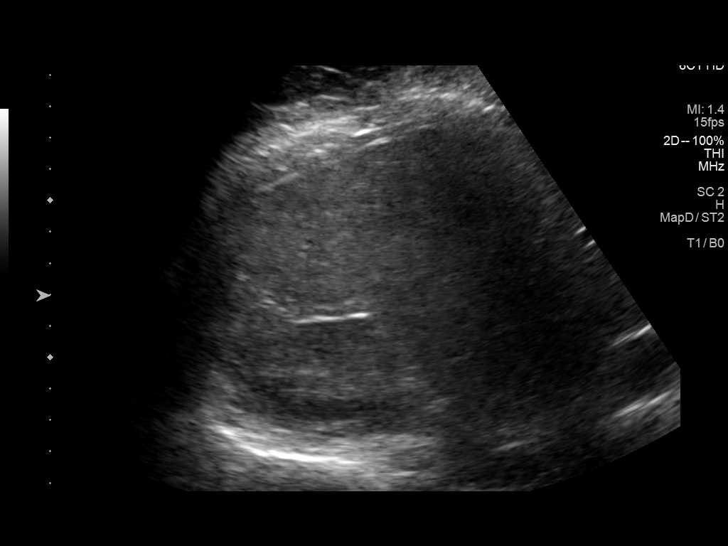
[im 18/39]
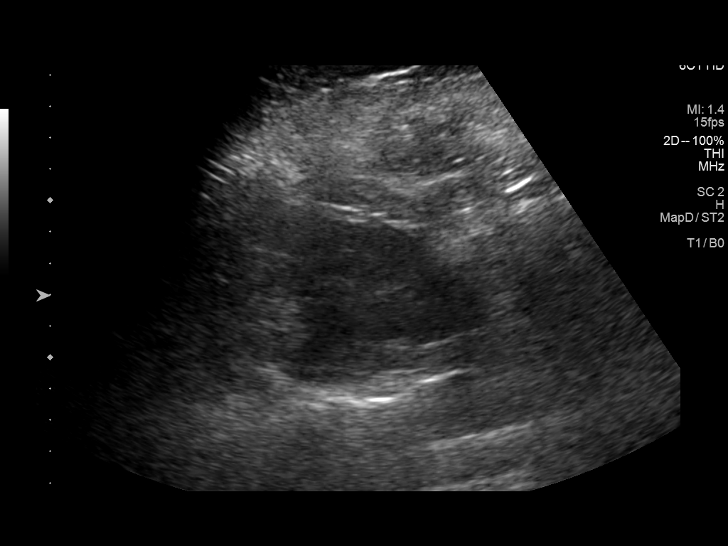
[im 21/39]
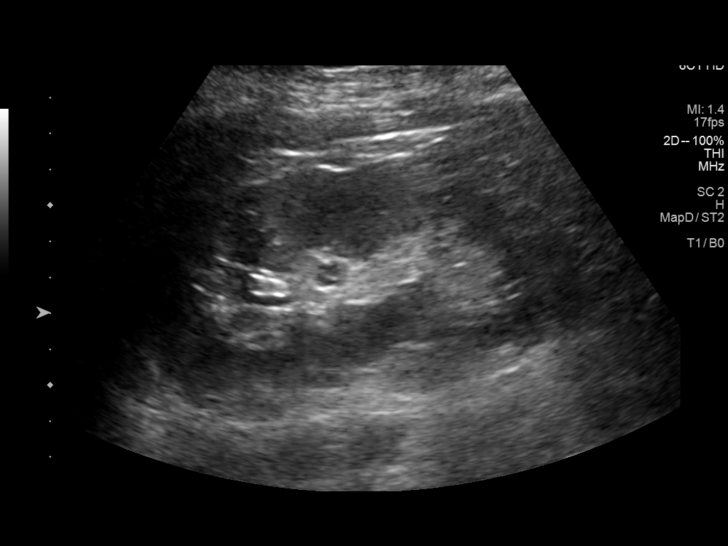
[im 24/39]
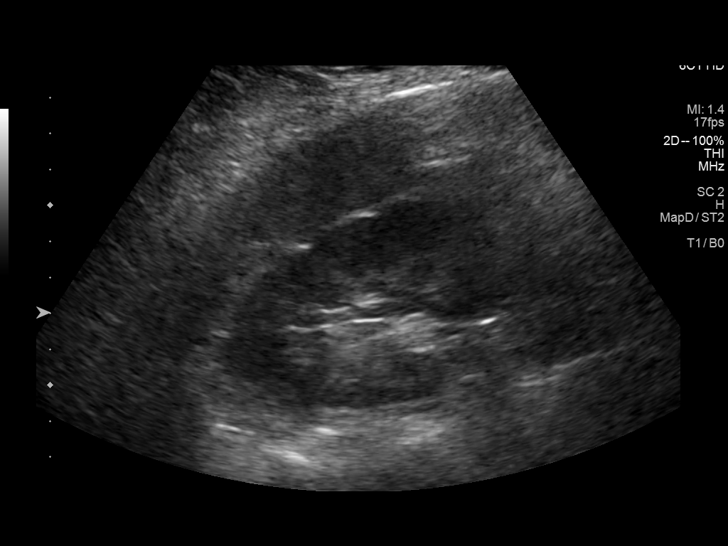
[im 26/39]
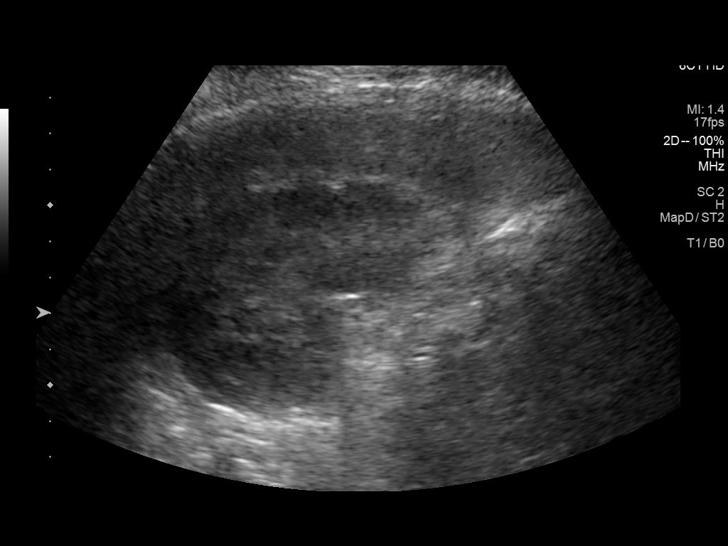
[im 29/39]
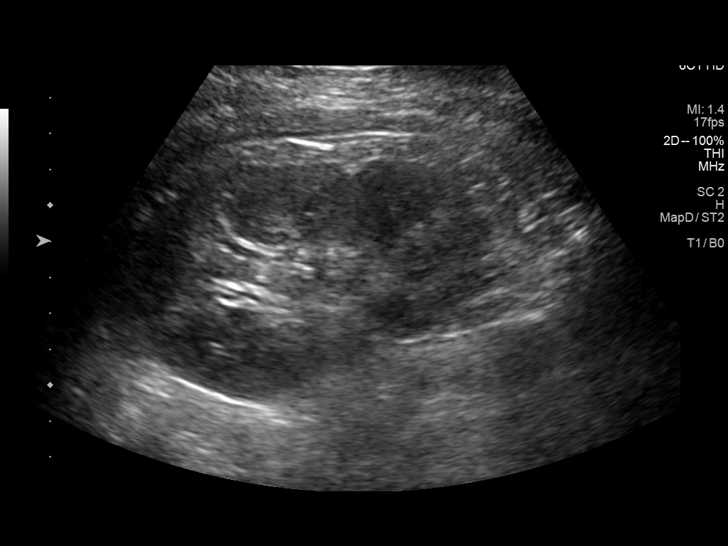
[im 32/39]
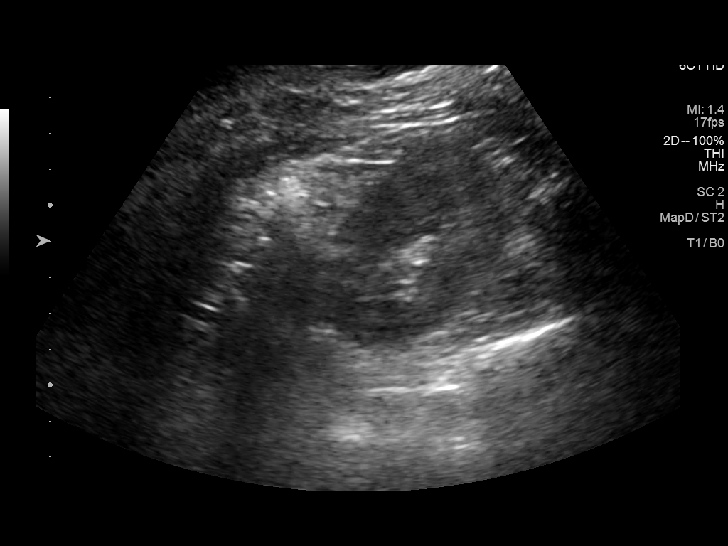
[im 35/39]
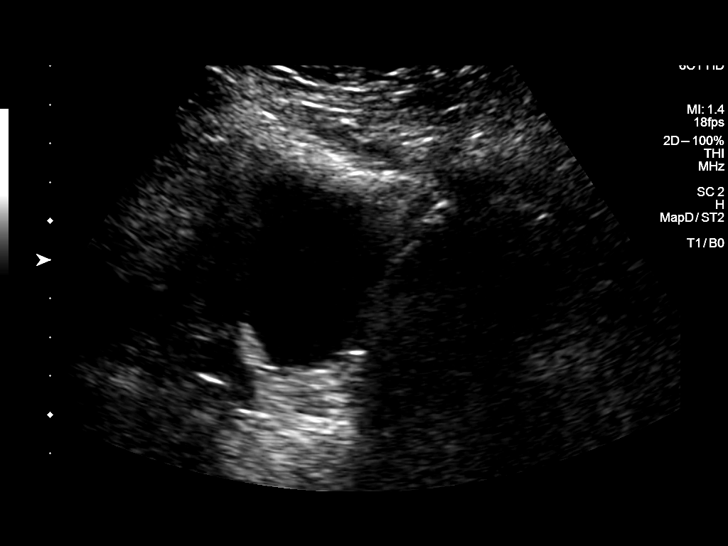
[im 39/39]
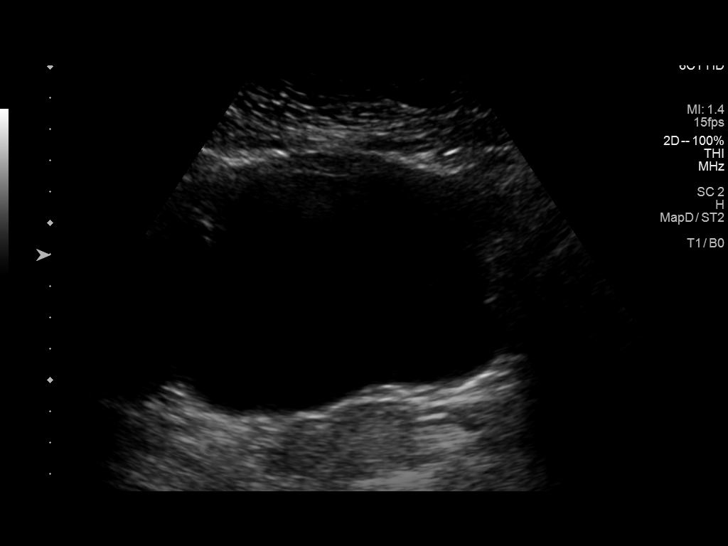

[14 of 25 positions shown; findings below may reference images not displayed]

FINDINGS: Right Kidney:

Renal measurements: 12.4 x 3.2 x 5.4 cm = volume: 180 mL. Mild
pelviectasis of the right kidney. Echogenicity within normal limits.
No mass visualized.

Left Kidney:

Renal measurements: 12.2 x 5.6 x 5.0 cm = volume: 177 mL.
Echogenicity within normal limits. No mass or hydronephrosis
visualized.

Bladder:

Appears normal for degree of bladder distention.

Other:

None.
IMPRESSION: Mild pelviectasis of the right kidney.

No visualized renal stones.

## 2023-09-14 ENCOUNTER — Ambulatory Visit: Admitting: Physician Assistant

## 2023-09-21 ENCOUNTER — Other Ambulatory Visit (HOSPITAL_COMMUNITY): Payer: Self-pay | Admitting: Psychiatry

## 2023-09-21 DIAGNOSIS — F410 Panic disorder [episodic paroxysmal anxiety] without agoraphobia: Secondary | ICD-10-CM

## 2023-09-21 DIAGNOSIS — F411 Generalized anxiety disorder: Secondary | ICD-10-CM

## 2023-09-22 ENCOUNTER — Encounter (HOSPITAL_COMMUNITY): Payer: Self-pay

## 2023-09-22 DIAGNOSIS — F411 Generalized anxiety disorder: Secondary | ICD-10-CM

## 2023-09-25 ENCOUNTER — Other Ambulatory Visit (HOSPITAL_COMMUNITY): Payer: Self-pay | Admitting: Psychiatry

## 2023-09-25 DIAGNOSIS — F3181 Bipolar II disorder: Secondary | ICD-10-CM

## 2023-09-25 MED ORDER — VORTIOXETINE HBR 10 MG PO TABS: 10.0000 mg | ORAL_TABLET | Freq: Every day | ORAL | 0 refills | Status: DC

## 2023-09-25 NOTE — Progress Notes (Signed)
 [facility] MD/PA/NP OP Progress Note  09/26/2023 8:28 AM Morgan Chang  MRN:  [id number]  Visit Diagnosis:    ICD-10-CM   1. History of ADHD  Z86.59 guanFACINE  (TENEX ) 1 MG tablet    2. Bipolar 2 disorder (HCC)  F31.81 zolpidem  (AMBIEN ) 10 MG tablet    lamoTRIgine  (LAMICTAL ) 100 MG tablet    3. Generalized anxiety disorder  F41.1 vortioxetine  HBr (TRINTELLIX ) 10 MG TABS tablet    DISCONTINUED: vortioxetine  HBr (TRINTELLIX ) 10 MG TABS tablet       Assessment: Morgan Chang is a 28 y.o. female with a history of MDD, sleep difficulties and reported ADHD who presented to [facility] at [facility] for initial evaluation on 12/02/2022.  During initial evaluation patient reported significant symptoms of anxiety including excessive worry control, difficulty relaxing, racing thoughts worse at night that affects sleep, restlessness, increased irritability, and fears about happening.  She also endorsed experiencing panic attacks which started in December 2023 and occur once every couple days.  During the episodes of panic patient reported symptoms of diaphoresis, increased restlessness, picking behaviors, and dissociations.  Patient also does note some depressive symptoms, that appear to be a bit better managed after starting the Trintellix .  She denied any SI or thoughts of self-harm along with any history of mania or psychosis. Patient met criteria for generalized anxiety disorder and panic attacks.   Over the course of treatment patient had an episode with concern for a manic episode in July-August of 2024.  Patient had described increased irritability, mood lability, negative self thoughts, feelings of worthlessness, insomnia, and passive SI for a 4-day period. Patient had presented to [facility] who started Abilify  and patient noted some improvement in her symptoms following this. With this episode patient also meets criteria for Bipolar 2 disorder.  Morgan Chang presents for follow-up  evaluation. Today, 09/26/23, patient reported reaching out twice in the interim although only one was visible to this provider.  Patient had discontinued Risperdal  shortly after starting it due to it inducing lactation.  She had also noticed increased anxiety after decreasing Trintellix  to 5 mg around 3 weeks ago.  Of note patient had been taking the 10 mg dose of Trintellix  daily following her hospitalization not realizing that it was decreased to 5 mg while she was there.  Outside of the increased anxiety patient reports no concerns about severe mood lability.  She can have intermittent episodes of low mood though they are not as debilitating as they had been in the past.  Due to some presence of symptoms still it would be appropriate to continue to titrate Lamictal  which we will increase to 200 mg daily.  We also did agree to continue Trintellix  10 mg daily that we will monitor for any progression to hypomania.  Patient reports sleep is well controlled with the addition of Ambien .  She also has been using Klonopin  more frequently the last 3 weeks for increased anxiety.  Prior to that she was using it roughly 2-3 times a week.  Patient does still endorse symptoms of ADHD.  As stimulant medication would not be recommended with bipolar disorder we did suggest trying a nonstimulant option.  Patient was open to trying what is guanfacine  and risk and benefits were reviewed.  Plan: - Continue Trintellix  10 mg daily - Increase Lamictal  to 200 mg daily - Start guanfacine  1 mg daily - Discontinue Risperdal  due to lactation  - Continue Klonopin  0.5 mg every day prn for anxiety - Discontinued Vyvanse  QD  managed by her PCP - Restart Ambien  10 mg at bedtime - CMP, CBC, TSH, Vit D reviewed - Crisis resources reviewed - Therapy referral  - Follow up in a month  Chief Complaint:  Chief Complaint  Patient presents with   Follow-up   HPI: Patient presents reporting that the past couple months things have been  going pretty good.  Her mood has remained overall fairly stable.  Morgan Chang does endorse still experiencing 3 to 4 days stretches where she has lower mood but does not find this debilitating as in the past.  She is still able to snap herself out of it and complete tasks such as making dinner or caring for her kids.  She denies any periods of mania/hypomania.  Patient notes that her sleep has been stable since restarting Ambien .  Of note patient reported reaching out twice in the interim of which her first contact in November was not seen by this provider and has not reflected in the chart.  That said she reported that she had discontinued Risperdal  around that time as she began lactating on medication.  She reports she only took it for a day or 2 due to this.  Despite discontinuing them back then mood had been stable.  The other time patient reached out was on 1/3 where she reported that she had been struggling with increased anxiety over the past few weeks.  Of note patient had been taking Trintellix  10 mg and decreased to 5 mg around that time.  She reports that there had been a miscommunication and she had not realized that her Trintellix  dose was decreased during her hospitalization in September.  Thus after discharge patient had still been taking 10 mg daily.  Despite this patient denied any manic/hypomanic symptoms.  We agreed that continuing Trintellix  10 mg daily to control anxiety symptoms would be appropriate however did suggest further titrating the Lamictal  for improved mood stabilization with no adverse side effects.  We reviewed that while Lamictal  is beneficial for keeping mood stable and can help prevent hypomanic/manic episodes it is not very effective at breaking one if she were to progress to that stage.  Patient did express understanding and will monitor for any change in sleep or other hypomanic symptoms.  We also reviewed the lack of antipsychotic does mean decreased mood stabilization.  As  patient has been stable we will continue on sole Lamictal  for mood stabilization now however can consider adjunct antipsychotics in the future if needed.  We also did discuss the Vyvanse  and explained that it can also increase the risk of hypomania.  That being the case we would recommend not restarting medication.  Patient expressed some concern about flight of ideas and difficulty focusing on/completing task without jumping from one thing to the next.  Reviewed an alternative ADHD medication that is nonstimulant in nature which patient was open to trying.  Past Psychiatric History: The patient has one prior psychiatric hospitalization to [facility] in September of 2024 due to worsening depression and SI. She denies any suicide attempts.  She has been connected with a couple therapists in the past, though is currently not seeing anyone after her last therapist left the practice in January.   She has tried mirtazapine , Wellbutrin , Prozac , Zoloft , Lexapro (headaches), Celexa , Trintellix , BuSpar, propranolol , Atarax  (sleepy), Seroquel  (hair loss), Zyprexa  (oral motor issues), Risperdal  (lactation), trazodone, Depakote, Adderall, Vyvanse , Ritalin , Klonopin , Xanax, Lunesta , Ambien   Patient currently taking Vyvanse , Trintellix , Ambien , and gabapentin .  She denies any substance use denies  other than one alcoholic drink a week.   Past Medical History:  Past Medical History:  Diagnosis Date   Anemia 09/14/2021   Anxiety    Depression    Endometriosis    Fibroid    Frank breech presentation 10/29/2016   GERD (gastroesophageal reflux disease) 12/10/2014   Gestational diabetes    Migraines    Pregnancy induced hypertension    S/P cesarean section 10/29/2016    Past Surgical History:  Procedure Laterality Date   ADENOIDECTOMY  10/29/2007   BREAST REDUCTION SURGERY     BREAST SURGERY  08/2015   CESAREAN SECTION N/A 10/29/2016   Procedure: CESAREAN SECTION;  Surgeon: Ezzie Buba, MD;  Location:  [facility];  Service: Obstetrics;  Laterality: N/A;   CESAREAN SECTION N/A 07/25/2021   Procedure: Repeat CESAREAN SECTION;  Surgeon: Kandyce Sor, MD;  Location: [facility];  Service: Obstetrics;  Laterality: N/A;   Repeat C/S BTL   LAPAROSCOPY  12/10/2014   NISSEN FUNDOPLICATION     RHINOPLASTY  03/22/2010   deviated septum   TONSILLECTOMY  2017   TUBAL LIGATION  07/25/2021    Family History:  Family History  Problem Relation Age of Onset   Heart disease Mother    Hypertension Mother    Anxiety disorder Mother    Depression Mother    Alcohol abuse Mother    Arthritis Mother    Hypertension Father    Anxiety disorder Father    Depression Father    Heart attack Father    Heart failure Father    Testicular cancer Brother    Diabetes Maternal Grandmother    Heart attack Maternal Grandfather        x 2    Social History:  Social History   Socioeconomic History   Marital status: Married    Spouse name: Not on file   Number of children: Not on file   Years of education: Not on file   Highest education level: Not on file  Occupational History   Occupation: Homemaker  Tobacco Use   Smoking status: Never    Passive exposure: Never   Smokeless tobacco: Never  Vaping Use   Vaping status: Never Used  Substance and Sexual Activity   Alcohol use: Yes    Comment: less than one drink per month   Drug use: No   Sexual activity: Yes    Birth control/protection: Surgical  Other Topics Concern   Not on file  Social History Narrative   Stay at home   2 children -- 58 year old son and 61 year old daughter (2024)   Married   Social Drivers of Corporate Investment Banker Strain: Not on file  Food Insecurity: No Food Insecurity (06/06/2023)   Hunger Vital Sign    Worried About Running Out of Food in the Last Year: Never true    Ran Out of Food in the Last Year: Never true  Transportation Needs: No Transportation Needs (06/06/2023)   PRAPARE - Doctor, General Practice (Medical): No    Lack of Transportation (Non-Medical): No  Physical Activity: Not on file  Stress: Not on file  Social Connections: Unknown (04/04/2023)   [facility]   Social Network    Social Network: Not on file    Allergies: No Known Allergies  Current Medications: Current Outpatient Medications  Medication Sig Dispense Refill   clonazePAM  (KLONOPIN ) 0.5 MG tablet TAKE 1 TABLET BY MOUTH EVERY DAY  AS NEEDED FOR ANXIETY 30 tablet 0   guanFACINE  (TENEX ) 1 MG tablet Take 1 tablet (1 mg total) by mouth daily. 30 tablet 2   lamoTRIgine  (LAMICTAL ) 100 MG tablet Take 2 tablets (200 mg total) by mouth daily. 60 tablet 2   vortioxetine  HBr (TRINTELLIX ) 10 MG TABS tablet Take 1 tablet (10 mg total) by mouth daily. 30 tablet 2   zolpidem  (AMBIEN ) 10 MG tablet Take 1 tablet (10 mg total) by mouth at bedtime as needed for sleep. 30 tablet 1   No current facility-administered medications for this visit.     Psychiatric Specialty Exam: Review of Systems  There were no vitals taken for this visit.There is no height or weight on file to calculate BMI.  General Appearance: Fairly Groomed  Eye Contact:  Good  Speech:  Clear and Coherent and Pressured  Volume:  Normal  Mood:  Anxious and Euthymic  Affect:  Congruent  Thought Process:  Coherent and Goal Directed  Orientation:  Full (Time, Place, and Person)  Thought Content: Logical   Suicidal Thoughts:  No  Homicidal Thoughts:  No  Memory:  Immediate;   Good  Judgement:  Good  Insight:  Fair  Psychomotor Activity:  Normal  Concentration:  Concentration: Good  Recall:  Good  Fund of Knowledge: Fair  Language: Good  Akathisia:  NA    AIMS (if indicated): not done  Assets:  Communication Skills Desire for Improvement Housing Talents/Skills Transportation  ADL's:  Intact  Cognition: WNL  Sleep:  Good   Metabolic Disorder Labs: Lab Results  Component Value Date   HGBA1C 5.2 04/20/2023   No  results found for: PROLACTIN Lab Results  Component Value Date   CHOL 167 04/20/2023   TRIG 99.0 04/20/2023   HDL 56.30 04/20/2023   CHOLHDL 3 04/20/2023   VLDL 19.8 04/20/2023   LDLCALC 91 04/20/2023   Lab Results  Component Value Date   TSH 1.34 03/30/2022    Therapeutic Level Labs: No results found for: LITHIUM No results found for: VALPROATE No results found for: CBMZ   Screenings: AUDIT    Flowsheet Row Admission (Discharged) from 06/06/2023 in [facility]PATIENT [facility]ICINE  Alcohol Use Disorder Identification Test Final Score (AUDIT) 0      GAD-7    Flowsheet Row Office Visit from 04/20/2023 in [facility] [place] Video Visit from 12/01/2022 in [facility] Video Visit from 09/30/2022 in [facility] from 08/25/2022 in [facility] from 12/28/2021 in [facility] [place]  Total GAD-7 Score 21 20 3 21 20       PHQ2-9    Flowsheet Row Office Visit from 04/20/2023 in [facility] [place] Video Visit from 12/01/2022 in [facility] Office Visit from 08/25/2022 in [facility] from 12/28/2021 in [facility] [place] Nutrition from 04/14/2021 in [facility]  - [facility] [place]  PHQ-2 Total Score 3 1 6  0 0  PHQ-9 Total Score 15 11 18 6  --      Flowsheet Row Admission (Discharged) from 06/06/2023 in [facility] INPATIENT BEHAVIORAL MEDICINE Most recent reading at 06/06/2023 11:00 PM ED from 06/06/2023 in [facility] Emergency Department at [facility] Most recent reading  at 06/06/2023 10:22 AM ED from 04/15/2023 in [facility] Most recent reading at 04/15/2023  8:28 PM   C-SSRS RISK CATEGORY Low Risk Low Risk Low Risk       Collaboration of Care: Collaboration of Care: Medication Management AEB medication prescription and Other provider involved in patient's care AEB urgent care chart review  Patient/Guardian was advised Release of Information must be obtained prior to any record release in order to collaborate their care with an outside provider. Patient/Guardian was advised if they have not already done so to contact the registration department to sign all necessary forms in order for us  to release information regarding their care.   Consent: Patient/Guardian gives verbal consent for treatment and assignment of benefits for services provided during this visit. Patient/Guardian expressed understanding and agreed to proceed.    Arvella CHRISTELLA Finder, MD 09/26/2023, 8:28 AM   Virtual Visit via Video Note  I connected with Morgan Chang on 09/26/23 at  8:00 AM EST by a video enabled telemedicine application and verified that I am speaking with the correct person using two identifiers.  Location: Patient: Home Provider: Home Office   I discussed the limitations of evaluation and management by telemedicine and the availability of in person appointments. The patient expressed understanding and agreed to proceed.   I discussed the assessment and treatment plan with the patient. The patient was provided an opportunity to ask questions and all were answered. The patient agreed with the plan and demonstrated an understanding of the instructions.   The patient was advised to call back or seek an in-person evaluation if the symptoms worsen or if the condition fails to improve as anticipated.  I provided 25 minutes of non-face-to-face time during this encounter.   Arvella CHRISTELLA Finder, MD

## 2023-09-26 ENCOUNTER — Encounter: Admitting: Internal Medicine

## 2023-09-26 ENCOUNTER — Encounter (HOSPITAL_COMMUNITY): Payer: Self-pay | Admitting: Psychiatry

## 2023-09-26 ENCOUNTER — Telehealth (HOSPITAL_COMMUNITY): Admitting: Psychiatry

## 2023-09-26 DIAGNOSIS — F411 Generalized anxiety disorder: Secondary | ICD-10-CM | POA: Diagnosis not present

## 2023-09-26 DIAGNOSIS — Z8659 Personal history of other mental and behavioral disorders: Secondary | ICD-10-CM

## 2023-09-26 DIAGNOSIS — F3181 Bipolar II disorder: Secondary | ICD-10-CM | POA: Diagnosis not present

## 2023-09-26 MED ORDER — GUANFACINE HCL 1 MG PO TABS: 1.0000 mg | ORAL_TABLET | Freq: Every day | ORAL | 2 refills | Status: DC

## 2023-09-26 MED ORDER — LAMOTRIGINE 100 MG PO TABS: 200.0000 mg | ORAL_TABLET | Freq: Every day | ORAL | 2 refills | Status: DC

## 2023-09-26 MED ORDER — VORTIOXETINE HBR 10 MG PO TABS: 10.0000 mg | ORAL_TABLET | Freq: Every day | ORAL | 2 refills | Status: DC

## 2023-09-26 MED ORDER — ZOLPIDEM TARTRATE 10 MG PO TABS: 10.0000 mg | ORAL_TABLET | Freq: Every evening | ORAL | 1 refills | Status: DC | PRN

## 2023-09-26 MED ORDER — VORTIOXETINE HBR 10 MG PO TABS
10.0000 mg | ORAL_TABLET | Freq: Every day | ORAL | 2 refills | Status: DC
Start: 2023-09-26 — End: 2023-09-26

## 2023-10-03 ENCOUNTER — Ambulatory Visit: Admitting: Internal Medicine

## 2023-10-04 ENCOUNTER — Encounter (HOSPITAL_COMMUNITY): Payer: Self-pay

## 2023-10-05 ENCOUNTER — Encounter: Payer: Self-pay | Admitting: Physician Assistant

## 2023-10-05 ENCOUNTER — Ambulatory Visit: Admitting: Physician Assistant

## 2023-10-05 ENCOUNTER — Ambulatory Visit (INDEPENDENT_AMBULATORY_CARE_PROVIDER_SITE_OTHER)

## 2023-10-05 ENCOUNTER — Other Ambulatory Visit: Payer: Self-pay | Admitting: Physician Assistant

## 2023-10-05 VITALS — BP 106/80 | HR 77 | Temp 97.9°F | Ht 66.0 in | Wt 188.8 lb

## 2023-10-05 DIAGNOSIS — L659 Nonscarring hair loss, unspecified: Secondary | ICD-10-CM

## 2023-10-05 DIAGNOSIS — M5441 Lumbago with sciatica, right side: Secondary | ICD-10-CM

## 2023-10-05 DIAGNOSIS — G8929 Other chronic pain: Secondary | ICD-10-CM | POA: Diagnosis not present

## 2023-10-05 DIAGNOSIS — F902 Attention-deficit hyperactivity disorder, combined type: Secondary | ICD-10-CM

## 2023-10-05 DIAGNOSIS — E669 Obesity, unspecified: Secondary | ICD-10-CM | POA: Diagnosis not present

## 2023-10-05 LAB — HEMOGLOBIN A1C: Hgb A1c MFr Bld: 5.3 % (ref 4.6–6.5)

## 2023-10-05 LAB — CBC WITH DIFFERENTIAL/PLATELET
Basophils Absolute: 0 10*3/uL (ref 0.0–0.1)
Basophils Relative: 0.8 % (ref 0.0–3.0)
Eosinophils Absolute: 0.1 10*3/uL (ref 0.0–0.7)
Eosinophils Relative: 1.3 % (ref 0.0–5.0)
HCT: 42.2 % (ref 36.0–46.0)
Hemoglobin: 14.1 g/dL (ref 12.0–15.0)
Lymphocytes Relative: 37 % (ref 12.0–46.0)
Lymphs Abs: 2.1 10*3/uL (ref 0.7–4.0)
MCHC: 33.3 g/dL (ref 30.0–36.0)
MCV: 92.7 fl (ref 78.0–100.0)
Monocytes Absolute: 0.6 10*3/uL (ref 0.1–1.0)
Monocytes Relative: 9.8 % (ref 3.0–12.0)
Neutro Abs: 2.9 10*3/uL (ref 1.4–7.7)
Neutrophils Relative %: 51.1 % (ref 43.0–77.0)
Platelets: 261 10*3/uL (ref 150.0–400.0)
RBC: 4.55 Mil/uL (ref 3.87–5.11)
RDW: 13.2 % (ref 11.5–15.5)
WBC: 5.7 10*3/uL (ref 4.0–10.5)

## 2023-10-05 LAB — COMPREHENSIVE METABOLIC PANEL WITH GFR
ALT: 11 U/L (ref 0–35)
AST: 14 U/L (ref 0–37)
Albumin: 4.7 g/dL (ref 3.5–5.2)
Alkaline Phosphatase: 48 U/L (ref 39–117)
BUN: 17 mg/dL (ref 6–23)
CO2: 26 meq/L (ref 19–32)
Calcium: 9.3 mg/dL (ref 8.4–10.5)
Chloride: 106 meq/L (ref 96–112)
Creatinine, Ser: 1 mg/dL (ref 0.40–1.20)
GFR: 76.13 mL/min
Glucose, Bld: 82 mg/dL (ref 70–99)
Potassium: 4.3 meq/L (ref 3.5–5.1)
Sodium: 139 meq/L (ref 135–145)
Total Bilirubin: 0.6 mg/dL (ref 0.2–1.2)
Total Protein: 7.1 g/dL (ref 6.0–8.3)

## 2023-10-05 LAB — IBC + FERRITIN
Ferritin: 38.1 ng/mL (ref 10.0–291.0)
Iron: 130 ug/dL (ref 42–145)
Saturation Ratios: 34.6 % (ref 20.0–50.0)
TIBC: 375.2 ug/dL (ref 250.0–450.0)
Transferrin: 268 mg/dL (ref 212.0–360.0)

## 2023-10-05 LAB — TSH: TSH: 0.46 u[IU]/mL (ref 0.35–5.50)

## 2023-10-05 MED ORDER — WEGOVY 0.25 MG/0.5ML ~~LOC~~ SOAJ: 0.2500 mg | SUBCUTANEOUS | 1 refills | Status: DC

## 2023-10-05 MED ORDER — MELOXICAM 15 MG PO TABS: 15.0000 mg | ORAL_TABLET | Freq: Every day | ORAL | 0 refills | Status: DC

## 2023-10-05 NOTE — Progress Notes (Signed)
Morgan Chang is a 28 y.o. female here for a new problem.  History of Present Illness:   Chief Complaint  Patient presents with   Back Pain   Hip Pain   Weight Loss   Back Pain  She reports experiencing back pain located in her lower right back.  The pain tends to appear suddenly last for 2-3 week and would eventually resolve.  She has experienced 3-4 episodes since summer. Her episodes tend to worsen each episode.  She had experienced an episode after going on a long walk.  She does not belief working out or her recent new job is impacting her pain. She denies any injuries or falls.   Pain would occasionally radiate to her leg.   Associated symptoms include swelling around the area.  She has tried ibuprofen, NSAID's, and icy hot without much relief. She was given an epidural shot during child birth and started experiencing back discomfort after.  Obesity She has been trying to lose weight since October without much results.  She has been experiencing hair loss and acne but is unsure if they are related.  She has tried a calorie deficit, adjusting her diet, and regularly working out. She is interested in medical interventions at this time.   ADHD  She was recently taken off Vyvanse and is having difficulty adjusting.  She recently started Tenex 1 mg once daily to help manage her symptoms.   Hair loss  She has been taking experiencing hair thinning and hair loss.  She started taking beef liver supplements and multivitamins.  Will check blood work today.  Gestational Diabetes She does report having a history of gestational diabetes.  She reports experiencing headaches after having a sugary snack. She was previously on Metformin and tolerated it well. Denies any associated nausea.   Past Medical History:  Diagnosis Date   Anemia 09/14/2021   Anxiety    Depression    Endometriosis    Fibroid    Frank breech presentation 10/29/2016   GERD (gastroesophageal reflux  disease) 12/10/2014   Gestational diabetes    Migraines    Pregnancy induced hypertension    S/P cesarean section 10/29/2016     Social History   Tobacco Use   Smoking status: Never    Passive exposure: Never   Smokeless tobacco: Never  Vaping Use   Vaping status: Never Used  Substance Use Topics   Alcohol use: Yes    Comment: less than one drink per month   Drug use: No    Past Surgical History:  Procedure Laterality Date   ADENOIDECTOMY  10/29/2007   BREAST REDUCTION SURGERY     BREAST SURGERY  08/2015   CESAREAN SECTION N/A 10/29/2016   Procedure: CESAREAN SECTION;  Surgeon: Sherian Rein, MD;  [facility]: [facility];  Service: Obstetrics;  Laterality: N/A;   CESAREAN SECTION N/A 07/25/2021   Procedure: Repeat CESAREAN SECTION;  Surgeon: Noland Fordyce, MD;  Location: [facility];  Service: Obstetrics;  Laterality: N/A;   Repeat C/S BTL   LAPAROSCOPY  12/10/2014   NISSEN FUNDOPLICATION     RHINOPLASTY  03/22/2010   deviated septum   TONSILLECTOMY  2017   TUBAL LIGATION  07/25/2021    Family History  Problem Relation Age of Onset   Heart disease Mother    Hypertension Mother    Anxiety disorder Mother    Depression Mother    Alcohol abuse Mother    Arthritis Mother    Hypertension Father  Anxiety disorder Father    Depression Father    Heart attack Father    Heart failure Father    Testicular cancer Brother    Diabetes Maternal Grandmother    Heart attack Maternal Grandfather        x 2    No Known Allergies  Current Medications:   Current Outpatient Medications:    clonazePAM (KLONOPIN) 0.5 MG tablet, TAKE 1 TABLET BY MOUTH EVERY DAY AS NEEDED FOR ANXIETY, Disp: 30 tablet, Rfl: 0   guanFACINE (TENEX) 1 MG tablet, Take 1 tablet (1 mg total) by mouth daily., Disp: 30 tablet, Rfl: 2   lamoTRIgine (LAMICTAL) 100 MG tablet, Take 2 tablets (200 mg total) by mouth daily., Disp: 60 tablet, Rfl: 2   meloxicam (MOBIC) 15 MG tablet, Take 1 tablet  (15 mg total) by mouth daily., Disp: 30 tablet, Rfl: 0   Semaglutide-Weight Management (WEGOVY) 0.25 MG/0.5ML SOAJ, Inject 0.25 mg into the skin once a week., Disp: 2 mL, Rfl: 1   vortioxetine HBr (TRINTELLIX) 10 MG TABS tablet, Take 1 tablet (10 mg total) by mouth daily., Disp: 30 tablet, Rfl: 2   zolpidem (AMBIEN) 10 MG tablet, Take 1 tablet (10 mg total) by mouth at bedtime as needed for sleep., Disp: 30 tablet, Rfl: 1   Review of Systems:   Review of Systems  Gastrointestinal:  Negative for nausea and vomiting.  Musculoskeletal:  Positive for back pain and joint pain.       +swelling  Neurological:  Positive for headaches.  Negative unless otherwise specified per HPI.  Vitals:   Vitals:   10/05/23 0916  BP: 106/80  Pulse: 77  Temp: 97.9 F (36.6 C)  TempSrc: Temporal  SpO2: 98%  Weight: 188 lb 12.8 oz (85.6 kg)  Height: 5\' 6"  (1.676 m)     Body mass index is 30.47 kg/m.  Physical Exam:   Physical Exam Vitals and nursing note reviewed.  Constitutional:      General: She is not in acute distress.    Appearance: She is well-developed. She is not ill-appearing or toxic-appearing.  Cardiovascular:     Rate and Rhythm: Normal rate and regular rhythm.     Pulses: Normal pulses.     Heart sounds: Normal heart sounds, S1 normal and S2 normal.  Pulmonary:     Effort: Pulmonary effort is normal.     Breath sounds: Normal breath sounds.  Musculoskeletal:     Comments: No decreased ROM 2/2 pain with flexion/extension, lateral side bends, or rotation. Reproducible tenderness with deep palpation to bilateral inferior aspect of lumbar spine. No exquisite bony tenderness. No evidence of erythema, rash or ecchymosis.    Skin:    General: Skin is warm and dry.  Neurological:     Mental Status: She is alert.     GCS: GCS eye subscore is 4. GCS verbal subscore is 5. GCS motor subscore is 6.  Psychiatric:        Speech: Speech normal.        Behavior: Behavior normal. Behavior is  cooperative.     Assessment and Plan:   Chronic right-sided low back pain with right-sided sciatica Will order xray for further evaluation Trial mobic 15 mg daily as needed for flares If worsens/persists -- will refer the patient to physical therapy vs sports medicine  Obesity, unspecified class, unspecified obesity type, unspecified whether serious comorbidity present Continue efforts at healthy lifestyle Will trial Wegovy 0.25 mg -- risks/benefits/side effect(s) discussed If not covered,  will trial metformin Follow-up in 3 month(s), sooner if concerns  Hair thinning Update blood work and provide recommendations regarding TSH and iron levels  Attention deficit hyperactivity disorder (ADHD), combined type Reviewed Management per psychiatry  [name], [name]  I,Safa M Kadhim,acting as a scribe for [name], PA.,have documented all relevant documentation on the behalf of [name], PA,as directed by  [name], PA while in the presence of [name], [name].   I, [name], [name], have reviewed all documentation for this visit. The documentation on 10/05/23 for the exam, diagnosis, procedures, and orders are all accurate and complete.

## 2023-10-05 NOTE — Patient Instructions (Signed)
It was great to see you!  We will get blood work to assess your symptom(s) I will send in Orason, if this is not covered, I will trial Metformin  We will get xray of your low back Trial mobic 15 mg daily when symptom(s) worsen -- hold all OTC (available over the counter without a prescription) NSAIDs while taking this If new/worsening symptom(s) in the meantime, let me know and we will get you to our sports medicine team  Let's follow-up in 3 months, sooner if you have concerns.  Take care,  [name] [name]

## 2023-10-06 ENCOUNTER — Other Ambulatory Visit: Payer: Self-pay | Admitting: Physician Assistant

## 2023-10-06 ENCOUNTER — Encounter: Payer: Self-pay | Admitting: Physician Assistant

## 2023-10-06 DIAGNOSIS — G8929 Other chronic pain: Secondary | ICD-10-CM

## 2023-10-10 NOTE — Progress Notes (Deleted)
    Aleen Sells D[facility] [address] [place] [address] Phone: [phone]   Assessment and Plan:     There are no diagnoses linked to this encounter.  ***   Pertinent previous records reviewed include ***    Follow Up: ***     Subjective:   I, Bay Jarquin, am serving as a Neurosurgeon for Doctor Richardean Sale  Chief Complaint: low back pain   HPI:   10/11/2023 Patient is a 28 year old female with low back pain.patient states  Relevant Historical Information: ***  Additional pertinent review of systems negative.   Current Outpatient Medications:    clonazePAM (KLONOPIN) 0.5 MG tablet, TAKE 1 TABLET BY MOUTH EVERY DAY AS NEEDED FOR ANXIETY, Disp: 30 tablet, Rfl: 0   guanFACINE (TENEX) 1 MG tablet, Take 1 tablet (1 mg total) by mouth daily., Disp: 30 tablet, Rfl: 2   lamoTRIgine (LAMICTAL) 100 MG tablet, Take 2 tablets (200 mg total) by mouth daily., Disp: 60 tablet, Rfl: 2   meloxicam (MOBIC) 15 MG tablet, Take 1 tablet (15 mg total) by mouth daily., Disp: 30 tablet, Rfl: 0   Semaglutide-Weight Management (WEGOVY) 0.25 MG/0.5ML SOAJ, Inject 0.25 mg into the skin once a week., Disp: 2 mL, Rfl: 1   vortioxetine HBr (TRINTELLIX) 10 MG TABS tablet, Take 1 tablet (10 mg total) by mouth daily., Disp: 30 tablet, Rfl: 2   zolpidem (AMBIEN) 10 MG tablet, Take 1 tablet (10 mg total) by mouth at bedtime as needed for sleep., Disp: 30 tablet, Rfl: 1   Objective:     There were no vitals filed for this visit.    There is no height or weight on file to calculate BMI.    Physical Exam:    ***   Electronically signed by:  Aleen Sells [facility] 12:02 PM 10/10/23

## 2023-10-11 ENCOUNTER — Ambulatory Visit: Admitting: Sports Medicine

## 2023-10-12 ENCOUNTER — Ambulatory Visit: Admitting: Sports Medicine

## 2023-10-12 VITALS — BP 108/80 | HR 69 | Ht 66.0 in | Wt 189.0 lb

## 2023-10-12 DIAGNOSIS — G8929 Other chronic pain: Secondary | ICD-10-CM | POA: Diagnosis not present

## 2023-10-12 DIAGNOSIS — M5441 Lumbago with sciatica, right side: Secondary | ICD-10-CM

## 2023-10-12 DIAGNOSIS — M5442 Lumbago with sciatica, left side: Secondary | ICD-10-CM

## 2023-10-12 NOTE — Patient Instructions (Addendum)
Low back HEP  MRI low back  - Start meloxicam 15 mg daily x2 weeks.  If still having pain after 2 weeks, complete 3rd-week of NSAID. May use remaining NSAID as needed once daily for pain control.  Do not to use additional over-the-counter NSAIDs (ibuprofen, naproxen, Advil, Aleve) while taking prescription NSAIDs.  May use Tylenol [phone] mg 2 to 3 times a day for breakthrough pain. Follow up 5 days after MRI to discuss results

## 2023-10-12 NOTE — Progress Notes (Signed)
Morgan Chang [facility] [address] [place] [address] Phone: [phone]   Assessment and Plan:     1. Chronic bilateral low back pain with bilateral sciatica - Chronic with exacerbation, initial sports medicine visit - Concern for lumbar disc pathology based on HPI and physical exam with patient having low back pain with intermittent bilateral radicular symptoms for 1+ year - Based on no improvement with >6 weeks of conservative therapy, unremarkable lumbar x-ray, pain at times >6/10, pain affecting day-to-day activity, I recommend further evaluation with lumbar spine MRI without contrast - Start HEP for low back -Continue meloxicam 15 mg daily x2 weeks.  If still having pain after 2 weeks, complete 3rd-week of NSAID. May use remaining NSAID as needed once daily for pain control.  Do not to use additional over-the-counter NSAIDs (ibuprofen, naproxen, Advil, Aleve) while taking prescription NSAIDs.  May use Tylenol [phone] mg 2 to 3 times a day for breakthrough pain.  Patient has already been prescribed meloxicam and been taking it for the past week without significant benefit - No significant relief with physical therapy in the past -Reviewed patient's lumbar x-ray in clinic.  My interpretation: No acute fracture or vertebral collapse.  Pertinent previous records reviewed include lumbar x-ray 10/05/2023, primary care note 10/05/2023  Follow Up: 5 days after MRI to review results and discuss treatment plan.  Could discuss epidural CSI versus OMT versus previously positive ANA that I am unsure if patient has had further investigated   Subjective:   I, Morgan Chang, am serving as a Neurosurgeon for Doctor Richardean Sale  Chief Complaint: low back pain   HPI:   10/12/2023 Patient is a 28 year old female with low back pain.patient states pain for about a year of intermittent pain. Pain usually last about 3 week but now is more constant. She  states she has lumps on her low back . Pain with flexion. Pain radiates down to the hips. Tylenol and ibu don't help. Intermittent numbness down the leg. No MOI.   Relevant Historical Information: GERD, positive ANA  Additional pertinent review of systems negative.   Current Outpatient Medications:    clonazePAM (KLONOPIN) 0.5 MG tablet, TAKE 1 TABLET BY MOUTH EVERY DAY AS NEEDED FOR ANXIETY, Disp: 30 tablet, Rfl: 0   guanFACINE (TENEX) 1 MG tablet, Take 1 tablet (1 mg total) by mouth daily., Disp: 30 tablet, Rfl: 2   lamoTRIgine (LAMICTAL) 100 MG tablet, Take 2 tablets (200 mg total) by mouth daily., Disp: 60 tablet, Rfl: 2   meloxicam (MOBIC) 15 MG tablet, Take 1 tablet (15 mg total) by mouth daily., Disp: 30 tablet, Rfl: 0   Semaglutide-Weight Management (WEGOVY) 0.25 MG/0.5ML SOAJ, Inject 0.25 mg into the skin once a week., Disp: 2 mL, Rfl: 1   vortioxetine HBr (TRINTELLIX) 10 MG TABS tablet, Take 1 tablet (10 mg total) by mouth daily., Disp: 30 tablet, Rfl: 2   zolpidem (AMBIEN) 10 MG tablet, Take 1 tablet (10 mg total) by mouth at bedtime as needed for sleep., Disp: 30 tablet, Rfl: 1   Objective:     Vitals:   10/12/23 1103  BP: 108/80  Pulse: 69  SpO2: 99%  Weight: 189 lb (85.7 kg)  Height: 5\' 6"  (1.676 m)      Body mass index is 30.51 kg/m.    Physical Exam:    Gen: Appears well, nad, nontoxic and pleasant Psych: Alert and oriented, appropriate mood and affect Neuro: sensation intact,  strength is 5/5 in upper and lower extremities, muscle tone wnl Skin: no susupicious lesions or rashes  Back - Normal skin, Spine with normal alignment and no deformity.   No tenderness to vertebral process palpation.   Paraspinous muscles are not tender and without spasm NTTP gluteal musculature Straight leg raise negative Trendelenberg positive left Piriformis Test negative for radicular symptoms, though reproduced mild pain and tightness Gait normal  Central to left-sided lumbar  pain with extension.  No pain with rotation  Electronically signed by:  Morgan Chang [facility] 11:24 AM 10/12/23

## 2023-10-22 ENCOUNTER — Other Ambulatory Visit (HOSPITAL_COMMUNITY): Payer: Self-pay | Admitting: Psychiatry

## 2023-10-22 DIAGNOSIS — F410 Panic disorder [episodic paroxysmal anxiety] without agoraphobia: Secondary | ICD-10-CM

## 2023-10-22 DIAGNOSIS — F411 Generalized anxiety disorder: Secondary | ICD-10-CM

## 2023-10-26 ENCOUNTER — Ambulatory Visit: Admitting: Internal Medicine

## 2023-10-30 NOTE — Progress Notes (Signed)
BH MD/PA/NP OP Progress Note  11/02/2023 10:23 AM Morgan Chang  MRN:  [id number]  Visit Diagnosis:    ICD-10-CM   1. History of ADHD  Z86.59     2. Bipolar 2 disorder (HCC)  F31.81     3. Generalized anxiety disorder  F41.1 vortioxetine HBr (TRINTELLIX) 20 MG TABS tablet    clonazePAM (KLONOPIN) 0.5 MG tablet    cloNIDine (CATAPRES) 0.1 MG tablet    4. Panic attack as reaction to stress  F41.0 clonazePAM (KLONOPIN) 0.5 MG tablet   F43.0 cloNIDine (CATAPRES) 0.1 MG tablet    5. Encounter for long-term (current) use of medications  Z79.899 Cortisol    FSH/LH    CANCELED: Cortisol    CANCELED: FSH/LH      Assessment: Morgan Chang is a 28 y.o. female with a history of MDD, sleep difficulties and reported ADHD who presented to [facility] at [facility] for initial evaluation on 12/02/2022.  During initial evaluation patient reported significant symptoms of anxiety including excessive worry control, difficulty relaxing, racing thoughts worse at night that affects sleep, restlessness, increased irritability, and fears about happening.  She also endorsed experiencing panic attacks which started in December 2023 and occur once every couple days.  During the episodes of panic patient reported symptoms of diaphoresis, increased restlessness, picking behaviors, and dissociations.  Patient also does note some depressive symptoms, that appear to be a bit better managed after starting the Trintellix.  She denied any SI or thoughts of self-harm along with any history of mania or psychosis. Patient met criteria for generalized anxiety disorder and panic attacks.   Over the course of treatment patient had an episode with concern for a manic episode in July-August of 2024.  Patient had described increased irritability, mood lability, negative self thoughts, feelings of worthlessness, insomnia, and passive SI for a 4-day period. Patient had presented to [facility] who started Abilify and  patient noted some improvement in her symptoms following this. With this episode patient also meets criteria for Bipolar 2 disorder.  Morgan Chang presents for follow-up evaluation. Today, 11/02/23, patient reported an increase in anxiety in the interim.  Symptoms present daily every morning and becomes slightly more manageable over the course of the day.  She endorses racing thoughts, ruminations, feelings of being overwhelmed.  There is improvement after she takes Klonopin to the point where she is able to start her day.  She denies any associated triggers that correlate with the onset of these symptoms.  Mood wise patient denies any depression or signs of mania and is sleeping well at night.  Notably she has noticed some change in her menstrual cycle, increased fatigue, skin discolorations, difficulty with weight loss despite diet and exercise, and facial hair growth over the past month.  We will test cortisol along with LH to [facility] levels.  We will also increase Trintellix to 20 mg daily while monitoring for any symptoms concerning for progression to mania.  Now that she is on of mood stabilizing dose of Lamictal there is less concern for this.  Patient is aware and will reach out if signs of mania do occur.  We will start clonidine 0.1 twice a day as needed for anxiety symptoms and discontinue Tenex due to limited benefit.  Plan: - Increase Trintellix to 20 mg daily - Continue Lamictal to 200 mg daily - Start clonidine 0.1 mg BID prn for anxiety - Stop guanfacine 1 mg daily - Continue Klonopin 0.5 mg every day prn for  anxiety - Discontinued Vyvanse QD managed by her PCP - Restart Ambien 10 mg at bedtime - CMP, CBC, TSH, Vit D reviewed - Crisis resources reviewed - Therapy referral  - Follow up in a month  Chief Complaint:  Chief Complaint  Patient presents with   Follow-up   HPI: Patient presents reporting that things have been more difficult from an anxiety standpoint the past month.  Her  mood has been stable with no concerns for depression or mania and patient reports sleeping 7 to 8 hours a night consistently.  She also is still able to complete her daily task.  In regards to the anxiety starts from the second she wakes up and she finds herself unable to get out of bed until she takes the Klonopin.  Morgan Chang describes feeling constantly overwhelmed about everything though was unable to list a particular thing she is anxious about.  Furthermore she denies any notable change in her life circumstances the past month.  Patient notes that financially they are in the best place in a while and her kids and husband are all doing well.  These anxiety symptoms began intermittently in December before becoming daily in January.  We did review her ADHD symptoms and patient reports no significant change with the addition of Tenex.  She also denied any adverse side effects to the medication.  She has been making lists which has helped with her task completion to some degree.  Unfortunately however she feels like it is still struggle and she is getting more and more backed up.  We reviewed how it is possible that ADHD symptoms are contributing some to her present anxiety.  However due to the recent manic episode and risk of progression following reintroduction of Vyvanse we recommended holding off for now which patient was agreeable to.  Instead to manage anxiety symptoms we discussed titrating Trintellix to 20 mg and adding clonidine 0.1 mg as needed.  Patient will monitor sleep and mood symptoms closely to make sure no progression to mania is occurring.  If that were to be the case she will reach out and decreased Trintellix back to 10 mg.  As she is now on a mood stabilizing dose of Lamictal compared to the past we are less concerned about the progression to mania.  If however mania symptoms do occur we will then consider antipsychotic adjunct such as Latuda.  We opted to hold off on this today as patient  has had significant side effects from all antipsychotic trials in the past.  Patient had also raise concern about change in her menstrual cycle, mild weight gain despite engaging in significant diet and exercise, increased feelings of fatigue, changes in coloration in her arms, and some increase in facial hair growth.  Of note she lists a number of these symptoms occurring in the past month.  That being the case it would be appropriate to test for cortisol levels and LH to [facility] levels to see if any irregularities are present.  She has recently had an autoimmune panel, TSH, CBC, and CMP.  Past Psychiatric History: The patient has one prior psychiatric hospitalization to [facility] in September of 2024 due to worsening depression and SI. She denies any suicide attempts.  She has been connected with a couple therapists in the past, though is currently not seeing anyone after her last therapist left the practice in January.   She has tried mirtazapine, Wellbutrin, Prozac, Zoloft, Lexapro (headaches), Celexa, Trintellix, BuSpar, propranolol (helped with akathisia, Atarax (sleepy),  Seroquel (hair loss), Zyprexa (oral motor issues), Risperdal (lactation), trazodone, Depakote, Adderall, Vyvanse, Ritalin, Klonopin, Xanax, Lunesta, Ambien  Patient currently taking Vyvanse, Trintellix, Ambien, and gabapentin.  She denies any substance use denies other than one alcoholic drink a week.   Past Medical History:  Past Medical History:  Diagnosis Date   Anemia 09/14/2021   Anxiety    Depression    Endometriosis    Fibroid    Frank breech presentation 10/29/2016   GERD (gastroesophageal reflux disease) 12/10/2014   Gestational diabetes    Migraines    Pregnancy induced hypertension    S/P cesarean section 10/29/2016    Past Surgical History:  Procedure Laterality Date   ADENOIDECTOMY  10/29/2007   BREAST REDUCTION SURGERY     BREAST SURGERY  08/2015   CESAREAN SECTION N/A 10/29/2016   Procedure: CESAREAN  SECTION;  Surgeon: Sherian Rein, MD;  Location: [facility];  Service: Obstetrics;  Laterality: N/A;   CESAREAN SECTION N/A 07/25/2021   Procedure: Repeat CESAREAN SECTION;  Surgeon: Noland Fordyce, MD;  Location: [facility] LD ORS;  Service: Obstetrics;  Laterality: N/A;   Repeat C/S BTL   LAPAROSCOPY  12/10/2014   NISSEN FUNDOPLICATION     RHINOPLASTY  03/22/2010   deviated septum   TONSILLECTOMY  2017   TUBAL LIGATION  07/25/2021    Family History:  Family History  Problem Relation Age of Onset   Heart disease Mother    Hypertension Mother    Anxiety disorder Mother    Depression Mother    Alcohol abuse Mother    Arthritis Mother    Hypertension Father    Anxiety disorder Father    Depression Father    Heart attack Father    Heart failure Father    Testicular cancer Brother    Diabetes Maternal Grandmother    Heart attack Maternal Grandfather        x 2    Social History:  Social History   Socioeconomic History   Marital status: Married    Spouse name: Not on file   Number of children: Not on file   Years of education: Not on file   Highest education level: Not on file  Occupational History   Occupation: Homemaker  Tobacco Use   Smoking status: Never    Passive exposure: Never   Smokeless tobacco: Never  Vaping Use   Vaping status: Never Used  Substance and Sexual Activity   Alcohol use: Yes    Comment: less than one drink per month   Drug use: No   Sexual activity: Yes    Birth control/protection: Surgical  Other Topics Concern   Not on file  Social History Narrative   Stay at home   2 children -- 53 year old son and 74 year old daughter (2024)   Married   Social Drivers of Corporate investment banker Strain: Not on file  Food Insecurity: No Food Insecurity (06/06/2023)   Hunger Vital Sign    Worried About Running Out of Food in the Last Year: Never true    Ran Out of Food in the Last Year: Never true  Transportation Needs: No Transportation  Needs (06/06/2023)   PRAPARE - Administrator, Civil Service (Medical): No    Lack of Transportation (Non-Medical): No  Physical Activity: Not on file  Stress: Not on file  Social Connections: Unknown (04/04/2023)   [facility]   Social Network    Social Network: Not  on file    Allergies: No Known Allergies  Current Medications: Current Outpatient Medications  Medication Sig Dispense Refill   cloNIDine (CATAPRES) 0.1 MG tablet Take 1 tablet (0.1 mg total) by mouth 2 (two) times daily as needed (anxiety). 60 tablet 2   [START ON 11/20/2023] clonazePAM (KLONOPIN) 0.5 MG tablet Take 1 tablet (0.5 mg total) by mouth daily as needed for anxiety. 30 tablet 0   lamoTRIgine (LAMICTAL) 100 MG tablet Take 2 tablets (200 mg total) by mouth daily. 60 tablet 2   meloxicam (MOBIC) 15 MG tablet Take 1 tablet (15 mg total) by mouth daily. 30 tablet 0   Semaglutide-Weight Management (WEGOVY) 0.25 MG/0.5ML SOAJ Inject 0.25 mg into the skin once a week. 2 mL 1   vortioxetine HBr (TRINTELLIX) 20 MG TABS tablet Take 1 tablet (20 mg total) by mouth daily. 30 tablet 2   zolpidem (AMBIEN) 10 MG tablet Take 1 tablet (10 mg total) by mouth at bedtime as needed for sleep. 30 tablet 1   No current facility-administered medications for this visit.     Psychiatric Specialty Exam: Review of Systems  Last menstrual period 10/03/2023.There is no height or weight on file to calculate BMI.  General Appearance: Fairly Groomed  Eye Contact:  Good  Speech:  Clear and Coherent and Pressured  Volume:  Normal  Mood:  Anxious  Affect:  Congruent  Thought Process:  Coherent and Goal Directed  Orientation:  Full (Time, Place, and Person)  Thought Content: Logical   Suicidal Thoughts:  No  Homicidal Thoughts:  No  Memory:  Immediate;   Good  Judgement:  Good  Insight:  Fair  Psychomotor Activity:  Normal  Concentration:  Concentration: Good  Recall:  Good  Fund of Knowledge: Fair   Language: Good  Akathisia:  NA    AIMS (if indicated): not done  Assets:  Communication Skills Desire for Improvement Housing Talents/Skills Transportation  ADL's:  Intact  Cognition: WNL  Sleep:  Good   Metabolic Disorder Labs: Lab Results  Component Value Date   HGBA1C 5.3 10/05/2023   No results found for: "PROLACTIN" Lab Results  Component Value Date   CHOL 167 04/20/2023   TRIG 99.0 04/20/2023   HDL 56.30 04/20/2023   CHOLHDL 3 04/20/2023   VLDL 19.8 04/20/2023   LDLCALC 91 04/20/2023   Lab Results  Component Value Date   TSH 0.46 10/05/2023   TSH 1.34 03/30/2022    Therapeutic Level Labs: No results found for: "LITHIUM" No results found for: "VALPROATE" No results found for: "CBMZ"   Screenings: AUDIT    Flowsheet Row Admission (Discharged) from 06/06/2023 in [facility] INPATIENT [facility]AL [facility]INE  Alcohol Use Disorder Identification Test Final Score (AUDIT) 0      GAD-7    Flowsheet Row Office Visit from 10/05/2023 in [facility] [place] [employer] Visit from 04/20/2023 in [facility] [place] Video Visit from [facility]/2022 [facility] Video Visit from 09/30/2022 in [facility] [place] [employer] Visit from 08/25/2022 in [facility] [place]  Total GAD-7 Score 21 21 20 3 21       [employer]    Flowsheet Row Office Visit from 10/05/2023 in [facility] [facility] [place] [employer] Visit from 04/20/2023 in [facility] [place] Video Visit from 12/01/2022 in [facility] Office Visit from 08/25/2022  in [facility] [place] [employer] Visit from 12/28/2021 in [facility] [place]  PHQ-2 Total Score 1 3 1 6  0  PHQ-9 Total Score 9 15 11 18 6       Flowsheet Row Admission (Discharged) from  06/06/2023 in [facility] INPATIENT BEH[facility]AL MEDICINE Most recent reading at 06/06/2023 11:00 PM ED from 06/06/2023 in [facility] Emergency Department at [facility] Most recent reading at 06/06/2023 10:22 AM ED from 04/15/2023 in [facility] Most recent reading at 04/15/2023  8:28 PM  C-SSRS RISK CATEGORY Low Risk Low Risk Low Risk       Collaboration of Care: Collaboration of Care: Medication Management AEB medication prescription and Other provider involved in patient's care AEB PCP and sports medicine chart review  Patient/Guardian was advised Release of Information must be obtained prior to any record release in order to collaborate their care with an outside provider. Patient/Guardian was advised if they have not already done so to contact the registration department to sign all necessary forms in order for [place] to release information regarding their care.   Consent: Patient/Guardian gives verbal consent for treatment and assignment of benefits for services provided during this visit. Patient/Guardian expressed understanding and agreed to proceed.    Stasia Cavalier, MD 11/02/2023, 10:23 AM   Virtual Visit via Video Note  I connected with [name] on 11/02/23 at  9:30 AM EST by a video enabled telemedicine application and verified that I am speaking with the correct person using two identifiers.  Location: Patient: Home Provider: Home Office   I discussed the limitations of evaluation and management by telemedicine and the availability of in person appointments. The patient expressed understanding and agreed to proceed.   I discussed the assessment and treatment plan with the patient. The patient was provided an opportunity to ask questions and all were answered. The patient agreed with the plan and demonstrated an understanding of the instructions.   The patient was advised to call back or seek an in-person evaluation if the symptoms worsen or if the  condition fails to improve as anticipated.  I provided 25 minutes of non-face-to-face time during this encounter.   Stasia Cavalier, MD

## 2023-11-02 ENCOUNTER — Encounter (HOSPITAL_COMMUNITY): Payer: Self-pay | Admitting: Psychiatry

## 2023-11-02 ENCOUNTER — Telehealth (HOSPITAL_COMMUNITY): Admitting: Psychiatry

## 2023-11-02 DIAGNOSIS — F430 Acute stress reaction: Secondary | ICD-10-CM | POA: Diagnosis not present

## 2023-11-02 DIAGNOSIS — F410 Panic disorder [episodic paroxysmal anxiety] without agoraphobia: Secondary | ICD-10-CM

## 2023-11-02 DIAGNOSIS — F3181 Bipolar II disorder: Secondary | ICD-10-CM | POA: Diagnosis not present

## 2023-11-02 DIAGNOSIS — Z8659 Personal history of other mental and behavioral disorders: Secondary | ICD-10-CM | POA: Diagnosis not present

## 2023-11-02 DIAGNOSIS — Z79899 Other long term (current) drug therapy: Secondary | ICD-10-CM

## 2023-11-02 DIAGNOSIS — F411 Generalized anxiety disorder: Secondary | ICD-10-CM | POA: Diagnosis not present

## 2023-11-02 MED ORDER — CLONIDINE HCL 0.1 MG PO TABS: 0.1000 mg | ORAL_TABLET | Freq: Two times a day (BID) | ORAL | 2 refills | Status: DC | PRN

## 2023-11-02 MED ORDER — VORTIOXETINE HBR 20 MG PO TABS: 20.0000 mg | ORAL_TABLET | Freq: Every day | ORAL | 2 refills | Status: DC

## 2023-11-02 MED ORDER — CLONAZEPAM 0.5 MG PO TABS: 0.5000 mg | ORAL_TABLET | Freq: Every day | ORAL | 0 refills | Status: DC | PRN

## 2023-11-03 ENCOUNTER — Other Ambulatory Visit: Payer: Self-pay | Admitting: Physician Assistant

## 2023-11-13 ENCOUNTER — Encounter (HOSPITAL_COMMUNITY): Payer: Self-pay

## 2023-11-20 ENCOUNTER — Other Ambulatory Visit (HOSPITAL_COMMUNITY): Payer: Self-pay | Admitting: Psychiatry

## 2023-11-20 DIAGNOSIS — F3181 Bipolar II disorder: Secondary | ICD-10-CM

## 2023-11-21 ENCOUNTER — Telehealth (HOSPITAL_COMMUNITY): Payer: Self-pay

## 2023-11-21 ENCOUNTER — Other Ambulatory Visit (HOSPITAL_COMMUNITY): Payer: Self-pay | Admitting: Psychiatry

## 2023-11-21 DIAGNOSIS — F3181 Bipolar II disorder: Secondary | ICD-10-CM

## 2023-11-21 MED ORDER — LAMOTRIGINE 25 MG PO TABS: ORAL_TABLET | ORAL | 0 refills | Status: DC

## 2023-11-21 NOTE — Telephone Encounter (Signed)
 Patient called and said that she has been sick with the Noro virus for the last week, she has not been able to keep any of her medication down. She is feeling better and wants to be sure before she starts her medications again that she does not have to titrate the Lamictal. She said it has been 6 days since she had it. Please review and advise, thank you

## 2023-11-21 NOTE — Telephone Encounter (Signed)
 I called patient and let her know what Dr. Mercy Riding said. Patient verbalized her understanding and will call us with any issues

## 2023-11-22 ENCOUNTER — Ambulatory Visit
Admission: RE | Admit: 2023-11-22 | Discharge: 2023-11-22 | Disposition: A | Discharge status: Home / Self Care | Source: Ambulatory Visit | Attending: Internal Medicine | Admitting: Internal Medicine

## 2023-11-22 VITALS — BP 113/69 | HR 73 | Temp 98.2°F | Resp 16

## 2023-11-22 DIAGNOSIS — Z3202 Encounter for pregnancy test, result negative: Secondary | ICD-10-CM | POA: Insufficient documentation

## 2023-11-22 DIAGNOSIS — R112 Nausea with vomiting, unspecified: Secondary | ICD-10-CM | POA: Insufficient documentation

## 2023-11-22 DIAGNOSIS — R1084 Generalized abdominal pain: Secondary | ICD-10-CM | POA: Diagnosis not present

## 2023-11-22 DIAGNOSIS — R350 Frequency of micturition: Secondary | ICD-10-CM | POA: Diagnosis present

## 2023-11-22 LAB — POCT URINALYSIS DIP (MANUAL ENTRY)
Bilirubin, UA: NEGATIVE
Blood, UA: NEGATIVE
Glucose, UA: NEGATIVE mg/dL
Ketones, POC UA: NEGATIVE mg/dL
Leukocytes, UA: NEGATIVE
Nitrite, UA: NEGATIVE
Protein Ur, POC: 30 mg/dL — AB
Spec Grav, UA: 1.025 (ref 1.010–1.025)
Urobilinogen, UA: 1 U/dL
pH, UA: 7 (ref 5.0–8.0)

## 2023-11-22 LAB — POCT URINE PREGNANCY: Preg Test, Ur: NEGATIVE

## 2023-11-22 MED ORDER — ONDANSETRON 4 MG PO TBDP
4.0000 mg | ORAL_TABLET | Freq: Once | ORAL | Status: AC
  Administered 2023-11-22: 4 mg via ORAL

## 2023-11-22 MED ORDER — ONDANSETRON 4 MG PO TBDP: 4.0000 mg | ORAL_TABLET | Freq: Three times a day (TID) | ORAL | 0 refills | Status: DC | PRN

## 2023-11-22 NOTE — ED Provider Notes (Signed)
 [name]    CSN: [id number] Arrival date & time: 11/22/23  1026      History   Chief Complaint Chief Complaint  Patient presents with   Generalized Body Aches    Throwing up all morning, intense stomach pain - Entered by patient    HPI Morgan Chang is a 28 y.o. female.   Morgan Chang is a 28 y.o. female presenting for chief complaint of generalized body aches, nausea with vomiting, right upper and lower quadrant abdominal pain, and bilateral lower back pain that started this morning.  She has had a few episodes of nonbloody/nonbilious emesis this morning, states she has diarrhea frequently and this is normal for her.  Denies changes in stool habits.  Additionally reports urinary frequency and incomplete bladder emptying sensation that started a few days ago.  Reports intermittent vaginal discharge and vaginal itching that has been present for the last few weeks.  States she frequently has vaginal yeast infections.  Denies fever, chills, gross hematuria, flank pain, headache, dizziness, viral URI symptoms, rash, and chance of pregnancy.  Last menstrual cycle was November 07, 2023.  Reports recent new unprotected female sexual partner, would like STD testing.  Denies any known exposures to STD.  No recent antibiotic or steroid use.  No history of immunosuppression, she does not take an SGLT2 inhibitor.  Ibuprofen and heat improved pain to the abdomen prior to arrival.  Currently slightly nauseous.     Past Medical History:  Diagnosis Date   Anemia 09/14/2021   Anxiety    Depression    Endometriosis    Fibroid    Frank breech presentation 10/29/2016   GERD (gastroesophageal reflux disease) 12/10/2014   Gestational diabetes    Migraines    Pregnancy induced hypertension    S/P cesarean section 10/29/2016    Patient Active Problem List   Diagnosis Date Noted   Positive ANA (antinuclear antibody) 07/24/2023   Fluctuation of weight 07/24/2023   Arthralgia  07/24/2023   Bipolar 2 disorder, major depressive episode (HCC) 06/06/2023   Insomnia 06/06/2023   Suicidal ideation 06/06/2023   Marijuana dependence (HCC) 06/06/2023   Symptomatic mammary hypertrophy 09/06/2022   Panic attack as reaction to stress 04/08/2022   Panniculitis 12/13/2021   History of gestational hypertension 09/14/2021   Anemia 09/14/2021   Attention deficit hyperactivity disorder (ADHD), 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   Gestational diabetes mellitus (GDM), antepartum 04/14/2021   Generalized anxiety disorder 03/11/2020   Moderate episode of recurrent major depressive disorder (HCC) 03/11/2020   Sleep difficulties 03/11/2020   Mass of right breast 08/13/2018   Pain of breast 08/13/2018   External hemorrhoids 07/31/2016   Encephalopathy 05/15/2015   GERD (gastroesophageal reflux disease) 12/10/2014    Past Surgical History:  Procedure Laterality Date   ADENOIDECTOMY  10/29/2007   BREAST REDUCTION SURGERY     BREAST SURGERY  08/2015   CESAREAN SECTION N/A 10/29/2016   Procedure: CESAREAN SECTION;  Surgeon: Sherian Rein, MD;  Location: [facility];  Service: Obstetrics;  Laterality: N/A;   CESAREAN SECTION N/A 07/25/2021   Procedure: Repeat CESAREAN SECTION;  Surgeon: Noland Fordyce, MD;  Location: [facility];  Service: Obstetrics;  Laterality: N/A;   Repeat C/S BTL   LAPAROSCOPY  12/10/2014   NISSEN FUNDOPLICATION     RHINOPLASTY  03/22/2010   deviated septum   TONSILLECTOMY  2017   TUBAL LIGATION  07/25/2021    OB History     Gravida  4   Para  2   Term  2   Preterm      AB  2   Living  2      SAB  2   IAB      Ectopic      Multiple  0   Live Births  1        Obstetric Comments  C/s for breech          Home Medications    Prior to Admission medications   Medication Sig Start Date End Date Taking? Authorizing Provider  ondansetron (ZOFRAN-ODT) 4 MG disintegrating tablet Take 1 tablet (4 mg total) by mouth every 8  (eight) hours as needed for nausea or vomiting. 11/22/23  Yes Carlisle Beers, FNP  clonazePAM (KLONOPIN) 0.5 MG tablet Take 1 tablet (0.5 mg total) by mouth daily as needed for anxiety. Patient not taking: Reported on 11/22/2023 11/20/23   Stasia Cavalier, MD  cloNIDine (CATAPRES) 0.1 MG tablet Take 1 tablet (0.1 mg total) by mouth 2 (two) times daily as needed (anxiety). 11/02/23   Stasia Cavalier, MD  lamoTRIgine (LAMICTAL) 100 MG tablet Take 2 tablets (200 mg total) by mouth daily. 09/26/23 09/25/24  Stasia Cavalier, MD  lamoTRIgine (LAMICTAL) 25 MG tablet Take 1 tablet (25 mg total) by mouth daily for 14 days, THEN 2 tablets (50 mg total) daily for 14 days. Hold the prior prescription of Lamictal 200 mg daily.  We will retitrate back up to that dose over the next several weeks.. 11/21/23 12/19/23  Stasia Cavalier, MD  meloxicam (MOBIC) 15 MG tablet Take 1 tablet (15 mg total) by mouth daily. Patient not taking: Reported on 11/22/2023 10/05/23   [name], PA  Semaglutide-Weight Management (WEGOVY) 0.25 MG/0.5ML SOAJ Inject 0.25 mg into the skin once a week. Patient not taking: Reported on 11/22/2023 10/05/23   [name], PA  vortioxetine HBr (TRINTELLIX) 20 MG TABS tablet Take 1 tablet (20 mg total) by mouth daily. 11/02/23   Stasia Cavalier, MD  zolpidem (AMBIEN) 10 MG tablet TAKE 1 TABLET BY MOUTH EVERY DAY AT BEDTIME AS NEEDED SLEEP 11/20/23   Stasia Cavalier, MD    Family History Family History  Problem Relation Age of Onset   Heart disease Mother    Hypertension Mother    Anxiety disorder Mother    Depression Mother    Alcohol abuse Mother    Arthritis Mother    Hypertension Father    Anxiety disorder Father    Depression Father    Heart attack Father    Heart failure Father    Testicular cancer Brother    Diabetes Maternal Grandmother    Heart attack Maternal Grandfather        x 2    Social History Social History   Tobacco Use   Smoking status: Never    Passive  exposure: Never   Smokeless tobacco: Never  Vaping Use   Vaping status: Never Used  Substance Use Topics   Alcohol use: Yes    Comment: less than one drink per month   Drug use: No     Allergies   Patient has no known allergies.   Review of Systems Review of Systems Per HPI  Physical Exam Triage Vital Signs ED Triage Vitals  Encounter Vitals Group     BP 11/22/23 1035 113/69     Systolic BP Percentile --      Diastolic BP Percentile --      Pulse  Rate 11/22/23 1035 73     Resp 11/22/23 1035 16     Temp 11/22/23 1035 98.2 F (36.8 C)     Temp Source 11/22/23 1035 Oral     SpO2 11/22/23 1035 98 %     Weight --      Height --      Head Circumference --      Peak Flow --      Pain Score 11/22/23 1036 4     Pain Loc --      Pain Education --      Exclude from Growth Chart --    No data found.  Updated Vital Signs BP 113/69 (BP Location: Right Arm)   Pulse 73   Temp 98.2 F (36.8 C) (Oral)   Resp 16   LMP 11/07/2023 (Exact Date)   SpO2 98%   Visual Acuity Right Eye Distance:   Left Eye Distance:   Bilateral Distance:    Right Eye Near:   Left Eye Near:    Bilateral Near:     Physical Exam Vitals and nursing note reviewed.  Constitutional:      Appearance: She is not ill-appearing or toxic-appearing.  HENT:     Head: Normocephalic and atraumatic.     Right Ear: Hearing and external ear normal.     Left Ear: Hearing and external ear normal.     Nose: Nose normal.     Mouth/Throat:     Lips: Pink.     Mouth: Mucous membranes are moist.     Pharynx: No posterior oropharyngeal erythema.  Eyes:     General: Lids are normal. Vision grossly intact. Gaze aligned appropriately.     Extraocular Movements: Extraocular movements intact.     Conjunctiva/sclera: Conjunctivae normal.  Pulmonary:     Effort: Pulmonary effort is normal.  Abdominal:     General: Abdomen is flat. Bowel sounds are normal. There is no distension.     Palpations: Abdomen is soft.  There is no mass.     Tenderness: There is no abdominal tenderness. There is right CVA tenderness (Minimal). There is no left CVA tenderness, guarding or rebound.     Comments: Negative McBurney's, negative Murphy's.  Musculoskeletal:     Cervical back: Neck supple.  Skin:    General: Skin is warm and dry.     Capillary Refill: Capillary refill takes less than 2 seconds.     Findings: No rash.  Neurological:     General: No focal deficit present.     Mental Status: She is alert and oriented to person, place, and time. Mental status is at baseline.     Cranial Nerves: No dysarthria or facial asymmetry.  Psychiatric:        Mood and Affect: Mood normal.        Speech: Speech normal.        Behavior: Behavior normal.        Thought Content: Thought content normal.        Judgment: Judgment normal.      UC Treatments / Results  Labs (all labs ordered are listed, but only abnormal results are displayed) Labs Reviewed  POCT URINALYSIS DIP (MANUAL ENTRY) - Abnormal; Notable for the following components:      Result Value   Protein Ur, POC =30 (*)    All other components within normal limits  URINE CULTURE  POCT URINE PREGNANCY  CERVICOVAGINAL ANCILLARY ONLY    EKG   Radiology No results  found.  Procedures Procedures (including critical care time)  Medications Ordered in UC Medications  ondansetron (ZOFRAN-ODT) disintegrating tablet 4 mg (4 mg Oral Given 11/22/23 1041)    Initial Impression / Assessment and Plan / UC Course  I have reviewed the triage vital signs and the nursing notes.  Pertinent labs & imaging results that were available during my care of the patient were reviewed by me and considered in my medical decision making (see chart for details).   1.  Urinary frequency, nausea and vomiting, generalized abdominal pain, urinary pregnancy test negative Urinalysis is unremarkable for signs of urinary tract infection, however given mild right flank pain and  symptoms I would like to culture urine.  Staff will call if urine culture has significant growth and prescribe antibiotic at that time.  Differential includes norovirus, pyelonephritis, acute vaginitis, influenza, etc. Low suspicion for influenza/pyelonephritis/nephrolithiasis given clean urine and lack of viral URI symptoms.  Patient is overall well-appearing with hemodynamic stable vital signs and nonperitoneal abdominal exam.  Zofran improved nausea prior to discharge in clinic.  Recommend supportive care for symptomatic relief as outlined in AVS.   STI labs pending, will notify patient of positive results and treat accordingly per protocol when labs result.  Patient to avoid sexual intercourse until screening testing comes back.    Counseled patient on potential for adverse effects with medications prescribed/recommended today, strict ER and return-to-clinic precautions discussed, patient verbalized understanding.    Final Clinical Impressions(s) / UC Diagnoses   Final diagnoses:  Urinary frequency  Nausea and vomiting, unspecified vomiting type  Generalized abdominal pain  Urine pregnancy test negative     Discharge Instructions      Your urine is negative for signs of UTI. Your vaginal swab is pending, I suspect you may have a vaginal yeast infection contributing to your symptoms. You may also have norovirus which causes nausea, vomiting, and diarrhea and is most commonly known as the "stomach flu".  Eat bland foods for the next 12 to 24 hours including chicken broth, rice, toast, applesauce, and bananas. Drink plenty of fluids including water and Pedialyte for electrolyte replacement to prevent dehydration.  Take Zofran every 8 hours as needed for nausea and vomiting.  I have sent your urine for culture and we will call you if your urine grows any bacteria, otherwise if your urine culture is negative you will not hear from [place].  If you develop any new or worsening  symptoms or if your symptoms do not start to improve, please return here or follow-up with your primary care provider. If your symptoms are severe, please go to the emergency room.     ED Prescriptions     Medication Sig Dispense Auth. Provider   ondansetron (ZOFRAN-ODT) 4 MG disintegrating tablet Take 1 tablet (4 mg total) by mouth every 8 (eight) hours as needed for nausea or vomiting. 20 tablet Carlisle Beers, FNP      PDMP not reviewed this encounter.   Carlisle Beers, [place] 11/22/23 1105

## 2023-11-22 NOTE — ED Triage Notes (Addendum)
 Pt c/o body aches, intense stomach pain RLQ and vomiting this for 1 day. She has also had back pain for 4 days. She took ibuprofen earlier today which has eased pain

## 2023-11-22 NOTE — Discharge Instructions (Signed)
 Your urine is negative for signs of UTI. Your vaginal swab is pending, I suspect you may have a vaginal yeast infection contributing to your symptoms. You may also have norovirus which causes nausea, vomiting, and diarrhea and is most commonly known as the "stomach flu".  Eat bland foods for the next 12 to 24 hours including chicken broth, rice, toast, applesauce, and bananas. Drink plenty of fluids including water and Pedialyte for electrolyte replacement to prevent dehydration.  Take Zofran every 8 hours as needed for nausea and vomiting.  I have sent your urine for culture and we will call you if your urine grows any bacteria, otherwise if your urine culture is negative you will not hear from [place].  If you develop any new or worsening symptoms or if your symptoms do not start to improve, please return here or follow-up with your primary care provider. If your symptoms are severe, please go to the emergency room.

## 2023-11-23 ENCOUNTER — Ambulatory Visit (HOSPITAL_BASED_OUTPATIENT_CLINIC_OR_DEPARTMENT_OTHER)

## 2023-11-23 DIAGNOSIS — Z79899 Other long term (current) drug therapy: Secondary | ICD-10-CM

## 2023-11-23 LAB — CERVICOVAGINAL ANCILLARY ONLY
Bacterial Vaginitis (gardnerella): NEGATIVE
Candida Glabrata: NEGATIVE
Candida Vaginitis: NEGATIVE
Chlamydia: NEGATIVE
Comment: NEGATIVE
Comment: NEGATIVE
Comment: NEGATIVE
Comment: NEGATIVE
Comment: NEGATIVE
Comment: NORMAL
Neisseria Gonorrhea: NEGATIVE
Trichomonas: NEGATIVE

## 2023-11-23 NOTE — Progress Notes (Signed)
 Patient arrives for her due labs, venipuncture done in the right [facility]. Patient tolerated well and without complaint

## 2023-11-24 LAB — URINE CULTURE: Culture: 10000 — AB

## 2023-11-24 LAB — FSH/LH
FSH: 5 m[IU]/mL
LH: 18.8 m[IU]/mL

## 2023-11-24 LAB — CORTISOL: Cortisol: 6.1 ug/dL — ABNORMAL LOW (ref 6.2–19.4)

## 2023-11-26 ENCOUNTER — Encounter: Payer: Self-pay | Admitting: Physician Assistant

## 2023-11-27 ENCOUNTER — Other Ambulatory Visit: Payer: Self-pay | Admitting: Physician Assistant

## 2023-11-27 MED ORDER — AMOXICILLIN 875 MG PO TABS: 875.0000 mg | ORAL_TABLET | Freq: Two times a day (BID) | ORAL | 0 refills | Status: AC

## 2023-11-27 NOTE — Progress Notes (Deleted)
 BH MD/PA/NP OP Progress Note  11/27/2023 2:27 PM Morgan Chang  MRN:  [id number]  Visit Diagnosis:  No diagnosis found.   Assessment: Morgan Chang is a 28 y.o. female with a history of MDD, sleep difficulties and reported ADHD who presented to [facility] at [facility] for initial evaluation on 12/02/2022.  During initial evaluation patient reported significant symptoms of anxiety including excessive worry control, difficulty relaxing, racing thoughts worse at night that affects sleep, restlessness, increased irritability, and fears about happening.  She also endorsed experiencing panic attacks which started in December 2023 and occur once every couple days.  During the episodes of panic patient reported symptoms of diaphoresis, increased restlessness, picking behaviors, and dissociations.  Patient also does note some depressive symptoms, that appear to be a bit better managed after starting the Trintellix.  She denied any SI or thoughts of self-harm along with any history of mania or psychosis. Patient met criteria for generalized anxiety disorder and panic attacks.   Over the course of treatment patient had an episode with concern for a manic episode in July-August of 2024.  Patient had described increased irritability, mood lability, negative self thoughts, feelings of worthlessness, insomnia, and passive SI for a 4-day period. Patient had presented to [facility] who started Abilify and patient noted some improvement in her symptoms following this. With this episode patient also meets criteria for Bipolar 2 disorder.  [name] presents for follow-up evaluation. Today, 11/27/23, patient reported    an increase in anxiety in the interim.  Symptoms present daily every morning and becomes slightly more manageable over the course of the day.  She endorses racing thoughts, ruminations, feelings of being overwhelmed.  There is improvement after she takes Klonopin to the point  where she is able to start her day.  She denies any associated triggers that correlate with the onset of these symptoms.  Mood wise patient denies any depression or signs of mania and is sleeping well at night.  Notably she has noticed some change in her menstrual cycle, increased fatigue, skin discolorations, difficulty with weight loss despite diet and exercise, and facial hair growth over the past month.  We will test cortisol along with LH to [facility] levels.  We will also increase Trintellix to 20 mg daily while monitoring for any symptoms concerning for progression to mania.  Now that she is on of mood stabilizing dose of Lamictal there is less concern for this.  Patient is aware and will reach out if signs of mania do occur.  We will start clonidine 0.1 twice a day as needed for anxiety symptoms and discontinue Tenex due to limited benefit.  Plan: - Increase Trintellix to 20 mg daily - Continue Lamictal to 200 mg daily - Start clonidine 0.1 mg BID prn for anxiety - Stop guanfacine 1 mg daily - Continue Klonopin 0.5 mg every day prn for anxiety - Discontinued Vyvanse QD managed by her PCP - Restart Ambien 10 mg at bedtime - CMP, CBC, TSH, Vit D reviewed - Crisis resources reviewed - Therapy referral  - Follow up in a month  Chief Complaint:  No chief complaint on file.  HPI: Patient presents reporting that    things have been more difficult from an anxiety standpoint the past month.  Her mood has been stable with no concerns for depression or mania and patient reports sleeping 7 to 8 hours a night consistently.  She also is still able to complete her daily task.  In regards  to the anxiety starts from the second she wakes up and she finds herself unable to get out of bed until she takes the Klonopin.  Icel describes feeling constantly overwhelmed about everything though was unable to list a particular thing she is anxious about.  Furthermore she denies any notable change in her life  circumstances the past month.  Patient notes that financially they are in the best place in a while and her kids and husband are all doing well.  These anxiety symptoms began intermittently in December before becoming daily in January.  We did review her ADHD symptoms and patient reports no significant change with the addition of Tenex.  She also denied any adverse side effects to the medication.  She has been making lists which has helped with her task completion to some degree.  Unfortunately however she feels like it is still struggle and she is getting more and more backed up.  We reviewed how it is possible that ADHD symptoms are contributing some to her present anxiety.  However due to the recent manic episode and risk of progression following reintroduction of Vyvanse we recommended holding off for now which patient was agreeable to.  Instead to manage anxiety symptoms we discussed titrating Trintellix to 20 mg and adding clonidine 0.1 mg as needed.  Patient will monitor sleep and mood symptoms closely to make sure no progression to mania is occurring.  If that were to be the case she will reach out and decreased Trintellix back to 10 mg.  As she is now on a mood stabilizing dose of Lamictal compared to the past we are less concerned about the progression to mania.  If however mania symptoms do occur we will then consider antipsychotic adjunct such as Latuda.  We opted to hold off on this today as patient has had significant side effects from all antipsychotic trials in the past.  Patient had also raise concern about change in her menstrual cycle, mild weight gain despite engaging in significant diet and exercise, increased feelings of fatigue, changes in coloration in her arms, and some increase in facial hair growth.  Of note she lists a number of these symptoms occurring in the past month.  That being the case it would be appropriate to test for cortisol levels and LH to [facility] levels to see if any  irregularities are present.  She has recently had an autoimmune panel, TSH, CBC, and CMP.  Past Psychiatric History: The patient has one prior psychiatric hospitalization to [facility] in September of 2024 due to worsening depression and SI. She denies any suicide attempts.  She has been connected with a couple therapists in the past, though is currently not seeing anyone after her last therapist left the practice in January.   She has tried mirtazapine, Wellbutrin, Prozac, Zoloft, Lexapro (headaches), Celexa, Trintellix, BuSpar, propranolol (helped with akathisia, Atarax (sleepy), Seroquel (hair loss), Zyprexa (oral motor issues), Risperdal (lactation), trazodone, Depakote, Adderall, Vyvanse, Ritalin, Klonopin, Xanax, Lunesta, Ambien  Patient currently taking Vyvanse, Trintellix, Ambien, and gabapentin.  She denies any substance use denies other than one alcoholic drink a week.   Past Medical History:  Past Medical History:  Diagnosis Date   Anemia 09/14/2021   Anxiety    Depression    Endometriosis    Fibroid    Frank breech presentation 10/29/2016   GERD (gastroesophageal reflux disease) 12/10/2014   Gestational diabetes    Migraines    Pregnancy induced hypertension    S/P cesarean section 10/29/2016  Past Surgical History:  Procedure Laterality Date   ADENOIDECTOMY  10/29/2007   BREAST REDUCTION SURGERY     BREAST SURGERY  08/2015   CESAREAN SECTION N/A 10/29/2016   Procedure: CESAREAN SECTION;  Surgeon: Sherian Rein, MD;  [facility];  Service: Obstetrics;  Laterality: N/A;   CESAREAN SECTION N/A 07/25/2021   Procedure: Repeat CESAREAN SECTION;  Surgeon: Noland Fordyce, MD;  Location: [facility];  Service: Obstetrics;  Laterality: N/A;   Repeat C/S BTL   LAPAROSCOPY  12/10/2014   NISSEN FUNDOPLICATION     RHINOPLASTY  03/22/2010   deviated septum   TONSILLECTOMY  2017   TUBAL LIGATION  07/25/2021    Family History:  Family History  Problem Relation  Age of Onset   Heart disease Mother    Hypertension Mother    Anxiety disorder Mother    Depression Mother    Alcohol abuse Mother    Arthritis Mother    Hypertension Father    Anxiety disorder Father    Depression Father    Heart attack Father    Heart failure Father    Testicular cancer Brother    Diabetes Maternal Grandmother    Heart attack Maternal Grandfather        x 2    Social History:  Social History   Socioeconomic History   Marital status: Married    Spouse name: Not on file   Number of children: Not on file   Years of education: Not on file   Highest education level: Not on file  Occupational History   Occupation: Homemaker  Tobacco Use   Smoking status: Never    Passive exposure: Never   Smokeless tobacco: Never  Vaping Use   Vaping status: Never Used  Substance and Sexual Activity   Alcohol use: Yes    Comment: less than one drink per month   Drug use: No   Sexual activity: Yes    Birth control/protection: Surgical  Other Topics Concern   Not on file  Social History Narrative   Stay at home   2 children -- 29 year old son and 67 year old daughter (2024)   Married   Social Drivers of Corporate investment banker Strain: Not on file  Food Insecurity: No Food Insecurity (06/06/2023)   Hunger Vital Sign    Worried About Running Out of Food in the Last Year: Never true    Ran Out of Food in the Last Year: Never true  Transportation Needs: No Transportation Needs (06/06/2023)   PRAPARE - Administrator, Civil Service (Medical): No    Lack of Transportation (Non-Medical): No  Physical Activity: Not on file  Stress: Not on file  Social Connections: Unknown (04/04/2023)   [facility]   Social Network    Social Network: Not on file    Allergies: No Known Allergies  Current Medications: Current Outpatient Medications  Medication Sig Dispense Refill   amoxicillin (AMOXIL) 875 MG tablet Take 1 tablet (875 mg total) by  mouth 2 (two) times daily for 10 days. 20 tablet 0   clonazePAM (KLONOPIN) 0.5 MG tablet Take 1 tablet (0.5 mg total) by mouth daily as needed for anxiety. (Patient not taking: Reported on 11/22/2023) 30 tablet 0   cloNIDine (CATAPRES) 0.1 MG tablet Take 1 tablet (0.1 mg total) by mouth 2 (two) times daily as needed (anxiety). 60 tablet 2   lamoTRIgine (LAMICTAL) 100 MG tablet Take 2 tablets (200  mg total) by mouth daily. 60 tablet 2   lamoTRIgine (LAMICTAL) 25 MG tablet Take 1 tablet (25 mg total) by mouth daily for 14 days, THEN 2 tablets (50 mg total) daily for 14 days. Hold the prior prescription of Lamictal 200 mg daily.  We will retitrate back up to that dose over the next several weeks.. 42 tablet 0   meloxicam (MOBIC) 15 MG tablet Take 1 tablet (15 mg total) by mouth daily. (Patient not taking: Reported on 11/22/2023) 30 tablet 0   ondansetron (ZOFRAN-ODT) 4 MG disintegrating tablet Take 1 tablet (4 mg total) by mouth every 8 (eight) hours as needed for nausea or vomiting. 20 tablet 0   Semaglutide-Weight Management (WEGOVY) 0.25 MG/0.5ML SOAJ Inject 0.25 mg into the skin once a week. (Patient not taking: Reported on 11/22/2023) 2 mL 1   vortioxetine HBr (TRINTELLIX) 20 MG TABS tablet Take 1 tablet (20 mg total) by mouth daily. 30 tablet 2   zolpidem (AMBIEN) 10 MG tablet TAKE 1 TABLET BY MOUTH EVERY DAY AT BEDTIME AS NEEDED SLEEP 30 tablet 0   No current facility-administered medications for this visit.     Psychiatric Specialty Exam: Review of Systems  Last menstrual period 11/07/2023.There is no height or weight on file to calculate BMI.  General Appearance: Fairly Groomed  Eye Contact:  Good  Speech:  Clear and Coherent and Pressured  Volume:  Normal  Mood:  Anxious  Affect:  Congruent  Thought Process:  Coherent and Goal Directed  Orientation:  Full (Time, Place, and Person)  Thought Content: Logical   Suicidal Thoughts:  No  Homicidal Thoughts:  No  Memory:  Immediate;   Good   Judgement:  Good  Insight:  Fair  Psychomotor Activity:  Normal  Concentration:  Concentration: Good  Recall:  Good  Fund of Knowledge: Fair  Language: Good  Akathisia:  NA    AIMS (if indicated): not done  Assets:  Communication Skills Desire for Improvement Housing Talents/Skills Transportation  ADL's:  Intact  Cognition: WNL  Sleep:  Good   Metabolic Disorder Labs: Lab Results  Component Value Date   HGBA1C 5.3 10/05/2023   No results found for: "PROLACTIN" Lab Results  Component Value Date   CHOL 167 04/20/2023   TRIG 99.0 04/20/2023   HDL 56.30 04/20/2023   CHOLHDL 3 04/20/2023   VLDL 19.8 04/20/2023   LDLCALC 91 04/20/2023   Lab Results  Component Value Date   TSH 0.46 10/05/2023   TSH 1.34 03/30/2022    Therapeutic Level Labs: No results found for: "LITHIUM" No results found for: "VALPROATE" No results found for: "CBMZ"   Screenings: AUDIT    Flowsheet Row Admission (Discharged) from 06/06/2023 in [facility]  Alcohol Use Disorder Identification Test Final Score (AUDIT) 0      GAD-7    Flowsheet Row Office Visit from 10/05/2023 in [facility] [employer] Visit from 04/20/2023 in [facility] [place] Video Visit from [facility]/01/2022 [facility] Video Visit from 09/30/2022 in [facility] [employer] Visit from 08/25/2022 in [facility] [employer] [facility] [place]  Total GAD-7 Score 21 21 20 3 21       [employer]    Flowsheet Row Office Visit from 10/05/2023 in [facility] [employer] from 04/20/2023 in [facility] [employer] [facility] [place] [employer]  from 12/01/2022 [facility] Office Visit from 08/25/2022 in [facility] Visit from 12/28/2021 in [facility] [place]  PHQ-2 Total Score 1 3 1 6  0  PHQ-9 Total Score 9 15 11 18 6       Flowsheet Row ED from 11/22/2023 in [facility]) Most recent reading at 11/22/2023 10:34 AM Admission (Discharged) from 06/06/2023 in [facility] Most recent reading at 06/06/2023 11:00 PM ED from 06/06/2023 in [facility] at [facility] Most recent reading at 06/06/2023 10:22 AM  C-SSRS RISK CATEGORY No Risk Low Risk Low Risk       Collaboration of Care: Collaboration of Care: Medication Management AEB medication prescription and Other provider involved in patient's care AEB PCP and ED chart review  Patient/Guardian was advised Release of Information must be obtained prior to any record release in order to collaborate their care with an outside provider. Patient/Guardian was advised if they have not already done so to contact the registration department to sign all necessary forms in order for [place] to release information regarding their care.   Consent: Patient/Guardian gives verbal consent for treatment and assignment of benefits for services provided during this visit. Patient/Guardian expressed understanding and agreed to proceed.    Stasia Cavalier, MD 11/27/2023, 2:27 PM   Virtual Visit via Video Note  I connected with [name] on 11/27/23 at  4:30 PM EDT by a video enabled telemedicine application and verified that I am speaking with the correct person using two identifiers.  Location: Patient: Home Provider: Home Office   I discussed the limitations of evaluation and management by telemedicine and the availability of in person appointments. The patient expressed understanding and agreed to proceed.   I discussed the assessment and treatment plan with the patient. The patient was provided an opportunity to ask questions and all were answered. The patient agreed with the plan and demonstrated  an understanding of the instructions.   The patient was advised to call back or seek an in-person evaluation if the symptoms worsen or if the condition fails to improve as anticipated.  I provided 25 minutes of non-face-to-face time during this encounter.   Stasia Cavalier, MD

## 2023-11-30 ENCOUNTER — Telehealth (HOSPITAL_COMMUNITY): Admitting: Psychiatry

## 2023-12-01 ENCOUNTER — Ambulatory Visit: Admitting: Physician Assistant

## 2023-12-01 ENCOUNTER — Encounter: Payer: Self-pay | Admitting: Physician Assistant

## 2023-12-01 VITALS — BP 130/80 | HR 99 | Temp 98.6°F | Ht 66.0 in | Wt 176.5 lb

## 2023-12-01 DIAGNOSIS — U071 COVID-19: Secondary | ICD-10-CM | POA: Diagnosis not present

## 2023-12-01 LAB — POC COVID19 BINAXNOW: SARS Coronavirus 2 Ag: POSITIVE — AB

## 2023-12-01 LAB — POC INFLUENZA A&B (BINAX/QUICKVUE)
Influenza A, POC: NEGATIVE
Influenza B, POC: NEGATIVE

## 2023-12-01 NOTE — Patient Instructions (Signed)
 It was great to see you!  COVID (or suspected COVID) home recommendations  For current/suspected COVID symptoms: - Please watch closely for new onset shortness of breath, worsening shortness of breath, dizziness, confusion or any worsening symptoms. If any of these occur, please contact us during business hours, and if after business hours, please seek urgent care or go to the closest emergency room.  -Consider purchasing a pulse oximeter. If your levels are 94% or below persistently, please seek care at the hospital.   -If you test positive for COVID, everyone, regardless of vaccination status, should stay home if you have a fever, and continue to stay home until your fever resolves without use of medication.   -Updated guidelines state that you can return to normal activities when, for at least 24 hours, symptoms are improving overall, and if a fever was present, it has been gone without use of a fever-reducing medication  -Please inform any contacts of your positive result so they can appropriately quarantine/test.  -Push fluids and try to eat small, frequent meals with protein to maintain your stamina.     Take care,  [name] [name]

## 2023-12-01 NOTE — Progress Notes (Signed)
 Morgan Chang is a 28 y.o. female here for a new problem.  History of Present Illness:   Chief Complaint  Patient presents with   Flu like symptoms    Started yesterday with headache, body aches, last night fever 101.9, runny nose, congestion, sore throat.    HPI  COVID-19 Pt complains of body aches, headache, fever, runny nose, congestion, and sore throat starting yesterday.  Endorses mild ear pain.  Has a tickle in her throat, no cough.  Left abdominal pain, believes this may be due to constipation.  She initially believed her body aches were due to soreness after exercising yesterday.  Reports a fever of 101.9. last night.  Taking Tylenol to manage her symptoms.  She feels better than she did when she first woke up, but overall no change to her symptoms.  Past Medical History:  Diagnosis Date   Anemia 09/14/2021   Anxiety    Depression    Endometriosis    Fibroid    Frank breech presentation 10/29/2016   GERD (gastroesophageal reflux disease) 12/10/2014   Gestational diabetes    Migraines    Pregnancy induced hypertension    S/P cesarean section 10/29/2016     Social History   Tobacco Use   Smoking status: Never    Passive exposure: Never   Smokeless tobacco: Never  Vaping Use   Vaping status: Never Used  Substance Use Topics   Alcohol use: Yes    Comment: less than one drink per month   Drug use: No    Past Surgical History:  Procedure Laterality Date   ADENOIDECTOMY  10/29/2007   BREAST REDUCTION SURGERY     BREAST SURGERY  08/2015   CESAREAN SECTION N/A 10/29/2016   Procedure: CESAREAN SECTION;  Surgeon: Sherian Rein, MD;  [facility];  Service: Obstetrics;  Laterality: N/A;   CESAREAN SECTION N/A 07/25/2021   Procedure: Repeat CESAREAN SECTION;  Surgeon: Noland Fordyce, MD;  Location: [facility];  Service: Obstetrics;  Laterality: N/A;   Repeat C/S BTL   LAPAROSCOPY  12/10/2014   NISSEN FUNDOPLICATION     RHINOPLASTY   03/22/2010   deviated septum   TONSILLECTOMY  2017   TUBAL LIGATION  07/25/2021    Family History  Problem Relation Age of Onset   Heart disease Mother    Hypertension Mother    Anxiety disorder Mother    Depression Mother    Alcohol abuse Mother    Arthritis Mother    Hypertension Father    Anxiety disorder Father    Depression Father    Heart attack Father    Heart failure Father    Testicular cancer Brother    Diabetes Maternal Grandmother    Heart attack Maternal Grandfather        x 2    No Known Allergies  Current Medications:   Current Outpatient Medications:    amoxicillin (AMOXIL) 875 MG tablet, Take 1 tablet (875 mg total) by mouth 2 (two) times daily for 10 days., Disp: 20 tablet, Rfl: 0   clonazePAM (KLONOPIN) 0.5 MG tablet, Take 1 tablet (0.5 mg total) by mouth daily as needed for anxiety., Disp: 30 tablet, Rfl: 0   cloNIDine (CATAPRES) 0.1 MG tablet, Take 1 tablet (0.1 mg total) by mouth 2 (two) times daily as needed (anxiety)., Disp: 60 tablet, Rfl: 2   lamoTRIgine (LAMICTAL) 25 MG tablet, Take 1 tablet (25 mg total) by mouth daily for 14 days, THEN 2 tablets (50 mg total)  daily for 14 days. Hold the prior prescription of Lamictal 200 mg daily.  We will retitrate back up to that dose over the next several weeks.., Disp: 42 tablet, Rfl: 0   ondansetron (ZOFRAN-ODT) 4 MG disintegrating tablet, Take 1 tablet (4 mg total) by mouth every 8 (eight) hours as needed for nausea or vomiting., Disp: 20 tablet, Rfl: 0   vortioxetine HBr (TRINTELLIX) 20 MG TABS tablet, Take 1 tablet (20 mg total) by mouth daily., Disp: 30 tablet, Rfl: 2   zolpidem (AMBIEN) 10 MG tablet, TAKE 1 TABLET BY MOUTH EVERY DAY AT BEDTIME AS NEEDED SLEEP, Disp: 30 tablet, Rfl: 0   Review of Systems:   Negative unless otherwise specified per HPI.  Vitals:   Vitals:   12/01/23 1052  BP: 130/80  Pulse: 99  Temp: 98.6 F (37 C)  TempSrc: Temporal  SpO2: 97%  Weight: 176 lb 8 oz (80.1 kg)   Height: 5\' 6"  (1.676 m)     Body mass index is 28.49 kg/m.  Physical Exam:   Physical Exam Vitals and nursing note reviewed.  Constitutional:      General: She is not in acute distress.    Appearance: She is well-developed. She is not ill-appearing or toxic-appearing.  Cardiovascular:     Rate and Rhythm: Normal rate and regular rhythm.     Pulses: Normal pulses.     Heart sounds: Normal heart sounds, S1 normal and S2 normal.  Pulmonary:     Effort: Pulmonary effort is normal.     Breath sounds: Normal breath sounds.  Skin:    General: Skin is warm and dry.  Neurological:     Mental Status: She is alert.     GCS: GCS eye subscore is 4. GCS verbal subscore is 5. GCS motor subscore is 6.  Psychiatric:        Speech: Speech normal.        Behavior: Behavior normal. Behavior is cooperative.    Results for orders placed or performed in visit on 12/01/23  POC Influenza A&B(BINAX/QUICKVUE)  Result Value Ref Range   Influenza A, POC Negative Negative   Influenza B, POC Negative Negative  POC COVID-19  Result Value Ref Range   SARS Coronavirus 2 Ag Positive (A) Negative     Assessment and Plan:   1. COVID-19 (Primary) - POC Influenza A&B(BINAX/QUICKVUE) - POC COVID-19 No red flags on exam.   Discussed options for treatment - we discussed holding off on Paxlovid at this time as she is not high risk and having overall mild symptom(s). Work note provided Reviewed return precautions including new or worsening fever, SOB, new or worsening cough or other concerns.  Push fluids and rest.  I recommend that patient follow-up if symptoms worsen or persist despite treatment x 7-10 days, sooner if needed.   I, Isabelle Course, acting as a Neurosurgeon for [name], [name]., have documented all relevant documentation on the behalf of [name], [name], as directed by  [name], PA while in the presence of [name], [name].  I, [name], [name], have reviewed all  documentation for this visit. The documentation on 12/01/23 for the exam, diagnosis, procedures, and orders are all accurate and complete.  [name], [name]

## 2023-12-04 ENCOUNTER — Encounter (HOSPITAL_COMMUNITY): Payer: Self-pay

## 2023-12-04 NOTE — Progress Notes (Unsigned)
 BH MD/PA/NP OP Progress Note  12/08/2023 11:54 AM Morgan Chang  MRN:  [id number]  Visit Diagnosis:    ICD-10-CM   1. Bipolar 2 disorder (HCC)  F31.81 lamoTRIgine (LAMICTAL) 100 MG tablet    zolpidem (AMBIEN CR) 12.5 MG CR tablet    DISCONTINUED: zolpidem (AMBIEN CR) 12.5 MG CR tablet    DISCONTINUED: lamoTRIgine (LAMICTAL) 100 MG tablet    2. History of ADHD  Z86.59     3. Generalized anxiety disorder  F41.1 cloNIDine (CATAPRES) 0.1 MG tablet    zolpidem (AMBIEN CR) 12.5 MG CR tablet    DISCONTINUED: zolpidem (AMBIEN CR) 12.5 MG CR tablet    DISCONTINUED: cloNIDine (CATAPRES) 0.1 MG tablet    4. Panic attack as reaction to stress  F41.0 cloNIDine (CATAPRES) 0.1 MG tablet   F43.0 DISCONTINUED: cloNIDine (CATAPRES) 0.1 MG tablet       Assessment: Morgan Chang is a 28 y.o. female with a history of MDD, sleep difficulties and reported ADHD who presented to [facility] at [facility] for initial evaluation on 12/02/2022.  During initial evaluation patient reported significant symptoms of anxiety including excessive worry control, difficulty relaxing, racing thoughts worse at night that affects sleep, restlessness, increased irritability, and fears about happening.  She also endorsed experiencing panic attacks which started in December 2023 and occur once every couple days.  During the episodes of panic patient reported symptoms of diaphoresis, increased restlessness, picking behaviors, and dissociations.  Patient also does note some depressive symptoms, that appear to be a bit better managed after starting the Trintellix.  She denied any SI or thoughts of self-harm along with any history of mania or psychosis. Patient met criteria for generalized anxiety disorder and panic attacks.   Over the course of treatment patient had an episode with concern for a manic episode in July-August of 2024.  Patient had described increased irritability, mood lability, negative self  thoughts, feelings of worthlessness, insomnia, and passive SI for a 4-day period. Patient had presented to [facility] who started Abilify and patient noted some improvement in her symptoms following this. With this episode patient also meets criteria for Bipolar 2 disorder.  Morgan Chang presents for follow-up evaluation. Today, 12/08/23, patient reported improvement in anxiety symptoms in the interim.  She still has some baseline anxiety during the day particularly around feeling overwhelmed and completing tasks.  This might be in part related to the ADHD and difficulty organizing herself however symptoms improved with as needed clonidine which also indicates that there is an anxiety component.  Panic episodes during the day have resolved though they can still occur when patient wakes up during the night.  Notably insomnia is still a concern with multiple nighttime awakenings and worse anxiety and panic during these periods.  Discussed sleep hygiene and CBT for insomnia as well as transitioning the Ambien immediate release to extended release formulary.  Risk and benefits of this were reviewed.  We also will titrate clonidine to 0.1 mg 3 times a day as needed.  Patient has tolerated the reinitiation of Lamictal and we will continue the titration back up to the 200 mg dose.  She has not needed to use the Klonopin at all in the interim due to improving anxiety and benefit from the clonidine.  Labs were reviewed with cortisol being at 6.1 which is slightly below the normal levels.  Minimal concern with this though patient was encouraged to discuss it with her PCP as well.  Patient is scheduled to connect  with therapy in the next few weeks and we will follow up in a month.  Plan: - Continue Trintellix 20 mg daily - Continue Lamictal 50 mg for 11 days before increasing to 100 mg daily for 14 days, then increase to 200 mg daily - Increase clonidine 0.1 mg to TID prn for anxiety - Continue Klonopin 0.5 mg every day  prn for anxiety - Change Ambien to Ambien CR 12.5 mg at bedtime - CMP, CBC, TSH, Vit D, cortisol reviewed - Crisis resources reviewed - Therapy referral, going to start at [facility] psychology therapy - Follow up in a month  Chief Complaint:  Chief Complaint  Patient presents with   Follow-up   HPI: Patient had canceled her initial appointment and then reached out reporting that she is having increased difficulty with insomnia.  Patient notes that she wakes up roughly 4 hours after taking her Ambien.  She was interested in discussing extended release formulary.  On presentation today Morgan Chang reports that things have gone a bit better the past month.  Her anxiety symptoms have been improving and she is only really experience the panic episodes during the night when she wakes up.  The daytime anxiety can still present as feelings of being overwhelmed particularly with the number of task and organizing them.  Of note this is not a significant issue when she is at work.  Patient has been making a list with some minor benefit.  Morgan Chang did use the clonidine a couple times during the day with good benefit in managing the feeling of being overwhelmed.  While it is possible to some of the difficulty with organization is related to the ADHD, the anxiety seems to also be playing a significant role.  In regards to sleep symptoms patient reports that with the combination of the clonidine and Ambien she is falling asleep around 103:0 after going to bed at 9:30.  She will typically sleep close to 4-5 hours before waking up.  After that she has disrupted sleep often up for more than an hour before being up to fall back asleep for a short period.  When she wakes up in the night patient reports that her anxiety and panic are at its worst.  She endorses racing thoughts and excessive worry.  The grounding techniques of breathing and going for walks have been a little bit less effective.  She has been taking the as needed  clonidine in the evening and again when she wakes up in the middle of the night with good benefit.  Patient reports that she has not needed to use the Klonopin at all since starting the clonidine.  We discussed the sleep concerns and reviewed sleep hygiene as well as some basic CBT for insomnia techniques.  Explained to patient that when she does wake up in the night she should only stay in bed for around 15 minutes before getting up and going somewhere comfortable to engage in a calming activity until she gets tired again.  The lying in bed while awake will lead to subconscious associations of the bed with wakefulness.  Medication wise we discussed increasing the clonidine to 0.1 mg 3 times a day as needed so the patient can use it during the day when feeling overwhelmed if needed.  Especially since she has had good benefit from her evening and nighttime usages.  We will continue to titrate Lamictal back to 200 mg and patient is currently on the 50 mg dose.  We also did agree  to transition Ambien 10 mg to the extended release 12.5 mg dose.  The risk and benefits of this were reviewed as well as the development of tolerance.  Patient is scheduled to start therapy in the next few weeks and was encouraged to discuss CBT for insomnia with her provider.  We also did review patient's cortisol level of 6.1.  As it is only 0.1 lower than normal limits we are not overly concerned.  We did suggest patient mention it to her primary care doctor to see if she felt endocrinology referral was warranted.  LH and FSH levels were within normal limits.  Past Psychiatric History: The patient has one prior psychiatric hospitalization to [facility] in September of 2024 due to worsening depression and SI. She denies any suicide attempts.  She has been connected with a couple therapists in the past, though is currently not seeing anyone after her last therapist left the practice in January.   She has tried mirtazapine, Wellbutrin,  Prozac, Zoloft, Lexapro (headaches), Celexa, Trintellix, BuSpar, propranolol (helped with akathisia, Atarax (sleepy), Seroquel (hair loss), Zyprexa (oral motor issues), Risperdal (lactation), trazodone, Depakote, Adderall, Vyvanse, Ritalin, Klonopin, Xanax, Lunesta, Ambien  Patient currently taking Vyvanse, Trintellix, Ambien, and gabapentin.  She denies any substance use denies other than one alcoholic drink a week.   Past Medical History:  Past Medical History:  Diagnosis Date   Anemia 09/14/2021   Anxiety    Depression    Endometriosis    Fibroid    Frank breech presentation 10/29/2016   GERD (gastroesophageal reflux disease) 12/10/2014   Gestational diabetes    Migraines    Pregnancy induced hypertension    S/P cesarean section 10/29/2016    Past Surgical History:  Procedure Laterality Date   ADENOIDECTOMY  10/29/2007   BREAST REDUCTION SURGERY     BREAST SURGERY  08/2015   CESAREAN SECTION N/A 10/29/2016   Procedure: CESAREAN SECTION;  Surgeon: Sherian Rein, MD;  Location: [facility];  Service: Obstetrics;  Laterality: N/A;   CESAREAN SECTION N/A 07/25/2021   Procedure: Repeat CESAREAN SECTION;  Surgeon: Noland Fordyce, MD;  Location: [facility] LD ORS;  Service: Obstetrics;  Laterality: N/A;   Repeat C/S BTL   LAPAROSCOPY  12/10/2014   NISSEN FUNDOPLICATION     RHINOPLASTY  03/22/2010   deviated septum   TONSILLECTOMY  2017   TUBAL LIGATION  07/25/2021    Family History:  Family History  Problem Relation Age of Onset   Heart disease Mother    Hypertension Mother    Anxiety disorder Mother    Depression Mother    Alcohol abuse Mother    Arthritis Mother    Hypertension Father    Anxiety disorder Father    Depression Father    Heart attack Father    Heart failure Father    Testicular cancer Brother    Diabetes Maternal Grandmother    Heart attack Maternal Grandfather        x 2    Social History:  Social History   Socioeconomic History    Marital status: Married    Spouse name: Not on file   Number of children: Not on file   Years of education: Not on file   Highest education level: Not on file  Occupational History   Occupation: Homemaker  Tobacco Use   Smoking status: Never    Passive exposure: Never   Smokeless tobacco: Never  Vaping Use   Vaping status: Never Used  Substance and Sexual Activity  Alcohol use: Yes    Comment: less than one drink per month   Drug use: No   Sexual activity: Yes    Birth control/protection: Surgical  Other Topics Concern   Not on file  Social History Narrative   Stay at home   2 children -- 17 year old son and 72 year old daughter (2024)   Married   Social Drivers of Corporate investment banker Strain: Not on file  Food Insecurity: No Food Insecurity (06/06/2023)   Hunger Vital Sign    Worried About Running Out of Food in the Last Year: Never true    Ran Out of Food in the Last Year: Never true  Transportation Needs: No Transportation Needs (06/06/2023)   PRAPARE - Administrator, Civil Service (Medical): No    Lack of Transportation (Non-Medical): No  Physical Activity: Not on file  Stress: Not on file  Social Connections: Unknown (04/04/2023)   Received from [facility]   Social Network    Social Network: Not on file    Allergies: No Known Allergies  Current Medications: Current Outpatient Medications  Medication Sig Dispense Refill   clonazePAM (KLONOPIN) 0.5 MG tablet Take 1 tablet (0.5 mg total) by mouth daily as needed for anxiety. 30 tablet 0   cloNIDine (CATAPRES) 0.1 MG tablet Take 1 tablet (0.1 mg total) by mouth 3 (three) times daily as needed (anxiety). 90 tablet 2   lamoTRIgine (LAMICTAL) 100 MG tablet Take 0.5 tablets (50 mg total) by mouth daily for 11 days, THEN 1 tablet (100 mg total) daily for 14 days, THEN 2 tablets (200 mg total) daily for 14 days. Hold the prior prescription of Lamictal 200 mg daily.  We will retitrate back up to that  dose over the next several weeks.. 50 tablet 0   ondansetron (ZOFRAN-ODT) 4 MG disintegrating tablet Take 1 tablet (4 mg total) by mouth every 8 (eight) hours as needed for nausea or vomiting. 20 tablet 0   vortioxetine HBr (TRINTELLIX) 20 MG TABS tablet Take 1 tablet (20 mg total) by mouth daily. 30 tablet 2   zolpidem (AMBIEN CR) 12.5 MG CR tablet Take 1 tablet (12.5 mg total) by mouth at bedtime as needed for sleep. 30 tablet 0   No current facility-administered medications for this visit.     Psychiatric Specialty Exam: Review of Systems  Last menstrual period 11/07/2023.There is no height or weight on file to calculate BMI.  General Appearance: Fairly Groomed  Eye Contact:  Good  Speech:  Clear and Coherent and Pressured  Volume:  Normal  Mood:  Euthymic and anxiety improving worse at night  Affect:  Congruent  Thought Process:  Coherent and Goal Directed  Orientation:  Full (Time, Place, and Person)  Thought Content: Logical   Suicidal Thoughts:  No  Homicidal Thoughts:  No  Memory:  Immediate;   Good  Judgement:  Good  Insight:  Fair  Psychomotor Activity:  Normal  Concentration:  Concentration: Good  Recall:  Good  Fund of Knowledge: Fair  Language: Good  Akathisia:  NA    AIMS (if indicated): not done  Assets:  Communication Skills Desire for Improvement Housing Talents/Skills Transportation  ADL's:  Intact  Cognition: WNL  Sleep:  Fair   Metabolic Disorder Labs: Lab Results  Component Value Date   HGBA1C 5.3 10/05/2023   No results found for: "PROLACTIN" Lab Results  Component Value Date   CHOL 167 04/20/2023   TRIG 99.0 04/20/2023  HDL 56.30 04/20/2023   CHOLHDL 3 04/20/2023   VLDL 19.8 04/20/2023   LDLCALC 91 04/20/2023   Lab Results  Component Value Date   TSH 0.46 10/05/2023   TSH 1.34 03/30/2022    Therapeutic Level Labs: No results found for: "LITHIUM" No results found for: "VALPROATE" No results found for:  "CBMZ"   Screenings: AUDIT    Flowsheet Row Admission (Discharged) from 06/06/2023 in [facility]IENT [facility]INE  Alcohol Use Disorder Identification Test Final Score (AUDIT) 0      GAD-7    Flowsheet Row Office Visit from 10/05/2023 in [facility] [employer] [facility] Visit from 04/20/2023 in [facility] Video Visit from 12/01/2022 [facility] Video Visit from 09/30/2022 in [facility] [employer] Visit from 08/25/2022 in [facility] [place] [facility] [place]  Total GAD-7 Score 21 21 20 3 21       PHQ2-9    Flowsheet Row Office Visit from 10/05/2023 in [facility] [place] Office Visit from 04[facility]20/2023 in [facility] [place] Video Visit from 12/01/2022 [facility] Office Visit from 08/25/2022 in [facility] [place] Office Visit from 12/28/2021 in [facility] [place]  PHQ-2 Total Score 1 3 1 6  0  PHQ-9 Total Score 9 15 11 18 6       Flowsheet Row ED from 11/22/2023 in [facility]) Most recent reading at 11/22/2023 10:34 AM Admission (Discharged) from 06/06/2023 in [facility] INPATIENT BEHAVIORAL MEDICINE Most recent reading at 06/06/2023 11:00 PM ED from 06/06/2023 in [facility] Emergency Department at [facility] Most recent reading at 06/06/2023 10:22 AM  C-SSRS RISK CATEGORY No Risk Low Risk Low Risk       Collaboration of Care: Collaboration of Care: Medication Management AEB medication prescription and Other provider involved in patient's care AEB PCP and ED chart review  Patient/Guardian was advised Release of Information must be obtained prior to any record release in order to collaborate their care with an outside provider.  Patient/Guardian was advised if they have not already done so to contact the registration department to sign all necessary forms in order for [place] to release information regarding their care.   Consent: Patient/Guardian gives verbal consent for treatment and assignment of benefits for services provided during this visit. Patient/Guardian expressed understanding and agreed to proceed.    Stasia Cavalier, MD 12/08/2023, 11:54 AM   Virtual Visit via Video Note  I connected with Morgan Chang on 12/08/23 at 10:30 AM EDT by a video enabled telemedicine application and verified that I am speaking with the correct person using two identifiers.  Location: Patient: Home Provider: Home Office   I discussed the limitations of evaluation and management by telemedicine and the availability of in person appointments. The patient expressed understanding and agreed to proceed.   I discussed the assessment and treatment plan with the patient. The patient was provided an opportunity to ask questions and all were answered. The patient agreed with the plan and demonstrated an understanding of the instructions.   The patient was advised to call back or seek an in-person evaluation if the symptoms worsen or if the condition fails to improve as anticipated.  I provided 25 minutes of non-face-to-face time during this encounter.  Stasia Cavalier, MD

## 2023-12-08 ENCOUNTER — Encounter (HOSPITAL_COMMUNITY): Payer: Self-pay | Admitting: Psychiatry

## 2023-12-08 ENCOUNTER — Telehealth (HOSPITAL_BASED_OUTPATIENT_CLINIC_OR_DEPARTMENT_OTHER): Admitting: Psychiatry

## 2023-12-08 DIAGNOSIS — F3181 Bipolar II disorder: Secondary | ICD-10-CM

## 2023-12-08 DIAGNOSIS — F411 Generalized anxiety disorder: Secondary | ICD-10-CM

## 2023-12-08 DIAGNOSIS — F430 Acute stress reaction: Secondary | ICD-10-CM | POA: Diagnosis not present

## 2023-12-08 DIAGNOSIS — Z8659 Personal history of other mental and behavioral disorders: Secondary | ICD-10-CM

## 2023-12-08 DIAGNOSIS — F410 Panic disorder [episodic paroxysmal anxiety] without agoraphobia: Secondary | ICD-10-CM | POA: Diagnosis not present

## 2023-12-08 MED ORDER — LAMOTRIGINE 100 MG PO TABS: ORAL_TABLET | ORAL | 0 refills | Status: DC

## 2023-12-08 MED ORDER — ZOLPIDEM TARTRATE ER 12.5 MG PO TBCR: 12.5000 mg | EXTENDED_RELEASE_TABLET | Freq: Every evening | ORAL | 0 refills | Status: DC | PRN

## 2023-12-08 MED ORDER — ZOLPIDEM TARTRATE ER 12.5 MG PO TBCR
12.5000 mg | EXTENDED_RELEASE_TABLET | Freq: Every evening | ORAL | 0 refills | Status: DC | PRN
Start: 2023-12-08 — End: 2023-12-08

## 2023-12-08 MED ORDER — CLONIDINE HCL 0.1 MG PO TABS: 0.1000 mg | ORAL_TABLET | Freq: Three times a day (TID) | ORAL | 2 refills | Status: DC | PRN

## 2023-12-21 ENCOUNTER — Telehealth (HOSPITAL_COMMUNITY): Admitting: Psychiatry

## 2023-12-27 ENCOUNTER — Encounter (HOSPITAL_COMMUNITY): Payer: Self-pay

## 2024-01-09 ENCOUNTER — Other Ambulatory Visit (HOSPITAL_COMMUNITY): Payer: Self-pay | Admitting: Psychiatry

## 2024-01-09 DIAGNOSIS — F3181 Bipolar II disorder: Secondary | ICD-10-CM

## 2024-01-09 DIAGNOSIS — F411 Generalized anxiety disorder: Secondary | ICD-10-CM

## 2024-01-15 NOTE — Progress Notes (Signed)
 BH MD/PA/NP OP Progress Note  01/18/2024 9:17 AM Morgan Chang  MRN:  [id number]  Visit Diagnosis:    ICD-10-CM   1. Bipolar 2 disorder (HCC)  F31.81 lamoTRIgine  (LAMICTAL ) 200 MG tablet    zolpidem  (AMBIEN ) 10 MG tablet    2. History of ADHD  Z86.59     3. Generalized anxiety disorder  F41.1 zolpidem  (AMBIEN ) 10 MG tablet    vortioxetine  HBr (TRINTELLIX ) 20 MG TABS tablet    4. Panic attack as reaction to stress  F41.0    F43.0       Assessment: Morgan Chang is a 28 y.o. female with a history of MDD, sleep difficulties and reported ADHD who presented to [facility] for initial evaluation on 12/02/2022.  During initial evaluation patient reported significant symptoms of anxiety including excessive worry control, difficulty relaxing, racing thoughts worse at night that affects sleep, restlessness, increased irritability, and fears about happening.  She also endorsed experiencing panic attacks which started in December 2023 and occur once every couple days.  During the episodes of panic patient reported symptoms of diaphoresis, increased restlessness, picking behaviors, and dissociations.  Patient also does note some depressive symptoms, that appear to be a bit better managed after starting the Trintellix .  She denied any SI or thoughts of self-harm along with any history of mania or psychosis. Patient met criteria for generalized anxiety disorder and panic attacks.   Over the course of treatment patient had an episode which was concerning for hypomania in July-August of 2024.  Patient had described increased irritability, mood lability, negative self thoughts, feelings of worthlessness, insomnia, and passive SI for a 4-day period. Patient had presented to [facility] who started Abilify  and patient noted some improvement in her symptoms following this. With this episode patient also meets criteria for Bipolar 2 disorder.  Morgan Chang presents for follow-up  evaluation. Today, 01/18/24, patient reported that mood and anxiety have improved back to her baseline.  At this point she only experiences situational anxiety that is easily resolved and has been needing the as needed clonidine  less frequently.  Only concern is regarding insomnia which is still improved however patient has noticed decreased benefit with the Ambien  CR.  We will transition back to the Ambien  immediate release and reviewed risk and benefits including decreased metabolism and females.  Patient expressed her understanding.  We will continue on the remainder of current regimen and follow up in 2 months.  Plan: - Continue Trintellix  20 [facility] - Continue Lamictal  200 mg daily - Continue clonidine  0.1 mg to TID prn for anxiety - Continue Klonopin  0.5 mg every day prn for anxiety - Change Ambien  back to IR due to decreased benefit from  at bedtime - CMP, CBC, TSH, Vit D, cortisol reviewed - Crisis resources reviewed - Therapy referral, going to start at [facility] psychology therapy and working through [employer] - Follow up in 2 months  Chief Complaint:  Chief Complaint  Patient presents with   Follow-up   HPI: On presentation today Morgan Chang reports that things have been really good over the past 6 weeks.  She notes this is the best she has felt in a long time.  Her mood has been stable and her anxiety has been easily managed.  She can still experience situational episodes of anxiety typically around the kids and trying to get out the door but she is able to calm down without significant issue.  She is still using the clonidine  with good success though has needed it less frequently compared to the past.  As for the Klonopin  the only time she used it in the interim was when she misplaced the clonidine .  Overall she finds the clonidine  more effective for her anxiety than the Klonopin .  Patient has titrated Lamictal  back to 200 mg daily denying any adverse side  effects.  Early concern at this time his regarding sleep.  She has tried the Ambien  extended release however is not finding it to be any more beneficial than the immediate release formulary.  That being the case she was interested in going back to the immediate release.  We discussed this at her agreeable to switching back to the immediate release though did review that the 10 mg dose is above recommended dosing females.  Patient aware of decreased metabolism of medication in females and we will monitor for any concerns.  Past Psychiatric History: The patient has one prior psychiatric hospitalization to [facility] in September of 2024 due to worsening depression and SI. She denies any suicide attempts.  She has been connected with a couple therapists in the past, though is currently not seeing anyone after her last therapist left the practice in January.   She has tried mirtazapine , Wellbutrin , Prozac, Zoloft , Lexapro (headaches), Celexa , Trintellix , BuSpar, propranolol  (helped with akathisia, Atarax  (sleepy), Seroquel  (hair loss), Zyprexa  (oral motor issues), Risperdal  (lactation), trazodone, Depakote, Adderall, Vyvanse , Ritalin , Klonopin , Xanax, Lunesta , Ambien   Patient currently taking Vyvanse , Trintellix , Ambien , and gabapentin .  She denies any substance use denies other than one alcoholic drink a week.   Past Medical History:  Past Medical History:  Diagnosis Date   Anemia 09/14/2021   Anxiety    Depression    Endometriosis    Fibroid    Frank breech presentation 10/29/2016   GERD (gastroesophageal reflux disease) 12/10/2014   Gestational diabetes    Migraines    Pregnancy induced hypertension    S/P cesarean section 10/29/2016    Past Surgical History:  Procedure Laterality Date   ADENOIDECTOMY  10/29/2007   BREAST REDUCTION SURGERY     BREAST SURGERY  08/2015   CESAREAN SECTION N/A 10/29/2016   Procedure: CESAREAN SECTION;  Surgeon: Margaretmary Shaver, MD;  Location: [facility];  Service: Obstetrics;  Laterality: N/A;   CESAREAN SECTION N/A 07/25/2021   Procedure: Repeat CESAREAN SECTION;  Surgeon: Audelia Leaks, MD;  Location: [facility] LD ORS;  Service: Obstetrics;  Laterality: N/A;   Repeat C/S BTL   LAPAROSCOPY  12/10/2014   NISSEN FUNDOPLICATION     RHINOPLASTY  03/22/2010   deviated septum   TONSILLECTOMY  2017   TUBAL LIGATION  07/25/2021    Family History:  Family History  Problem Relation Age of Onset   Heart disease Mother    Hypertension Mother    Anxiety disorder Mother    Depression Mother    Alcohol abuse Mother    Arthritis Mother    Hypertension Father    Anxiety disorder Father    Depression Father    Heart attack Father    Heart failure Father    Testicular cancer Brother    Diabetes Maternal Grandmother    Heart attack Maternal Grandfather        x 2    Social History:  Social History   Socioeconomic History   Marital status: Married    Spouse name: Not on file   Number of children: Not on file   Years  of education: Not on file   Highest education level: Not on file  Occupational History   Occupation: Homemaker  Tobacco Use   Smoking status: Never    Passive exposure: Never   Smokeless tobacco: Never  Vaping Use   Vaping status: Never Used  Substance and Sexual Activity   Alcohol use: Yes    Comment: less than one drink per month   Drug use: No   Sexual activity: Yes    Birth control/protection: Surgical  Other Topics Concern   Not on file  Social History Narrative   Stay at home   2 children -- 64 year old son and 56 year old daughter (2024)   Married   Social Drivers of Corporate investment banker Strain: Not on file  Food Insecurity: No Food Insecurity (06/06/2023)   Hunger Vital Sign    Worried About Running Out of Food in the Last Year: Never true    Ran Out of Food in the Last Year: Never true  Transportation Needs: No Transportation Needs (06/06/2023)   PRAPARE - Scientist, research (physical sciences) (Medical): No    Lack of Transportation (Non-Medical): No  Physical Activity: Not on file  Stress: Not on file  Social Connections: Unknown (04/04/2023)   Received from [facility]   Social Network    Social Network: Not on file    Allergies: No Known Allergies  Current Medications: Current Outpatient Medications  Medication Sig Dispense Refill   zolpidem  (AMBIEN ) 10 MG tablet Take 1 tablet (10 mg total) by mouth at bedtime as needed for sleep. 30 tablet 1   clonazePAM  (KLONOPIN ) 0.5 MG tablet Take 1 tablet (0.5 mg total) by mouth daily as needed for anxiety. 30 tablet 0   cloNIDine  (CATAPRES ) 0.1 MG tablet Take 1 tablet (0.1 mg total) by mouth 3 (three) times daily as needed (anxiety). 90 tablet 2   lamoTRIgine  (LAMICTAL ) 200 MG tablet Take 1 tablet (200 mg total) by mouth daily for 11 days. Hold the prior prescription of Lamictal  200 mg daily.  We will retitrate back up to that dose over the next several weeks. 90 tablet 0   ondansetron  (ZOFRAN -ODT) 4 MG disintegrating tablet Take 1 tablet (4 mg total) by mouth every 8 (eight) hours as needed for nausea or vomiting. 20 tablet 0   vortioxetine  HBr (TRINTELLIX ) 20 MG TABS tablet Take 1 tablet (20 mg total) by mouth daily. 30 tablet 2   No current facility-administered medications for this visit.     Psychiatric Specialty Exam: Review of Systems  There were no vitals taken for this visit.There is no height or weight on file to calculate BMI.  General Appearance: Fairly Groomed  Eye Contact:  Good  Speech:  Clear and Coherent and Pressured  Volume:  Normal  Mood:  Euthymic  Affect:  Congruent  Thought Process:  Coherent and Goal Directed  Orientation:  Full (Time, Place, and Person)  Thought Content: Logical   Suicidal Thoughts:  No  Homicidal Thoughts:  No  Memory:  Immediate;   Good  Judgement:  Good  Insight:  Fair  Psychomotor Activity:  Normal  Concentration:  Concentration: Good  Recall:  Good  Fund  of Knowledge: Fair  Language: Good  Akathisia:  NA    AIMS (if indicated): not done  Assets:  Communication Skills Desire for Improvement Housing Talents/Skills Transportation  ADL's:  Intact  Cognition: WNL  Sleep:  Fair   Metabolic Disorder Labs: Lab Results  Component Value Date   HGBA1C 5.3 10/05/2023   No results found for: "PROLACTIN" Lab Results  Component Value Date   CHOL 167 04/20/2023   TRIG 99.0 04/20/2023   HDL 56.30 04/20/2023   CHOLHDL 3 04/20/2023   VLDL 19.8 04/20/2023   LDLCALC 91 04/20/2023   Lab Results  Component Value Date   TSH 0.46 10/05/2023   TSH 1.34 03/30/2022    Therapeutic Level Labs: No results found for: "LITHIUM" No results found for: "VALPROATE" No results found for: "CBMZ"   Screenings: AUDIT    Flowsheet Row Admission (Discharged) from 06/06/2023 in [facility]AL [facility]INE  Alcohol Use Disorder Identification Test Final Score (AUDIT) 0      GAD-7    Flowsheet Row Office Visit from 10/05/2023 in [facility] [employer] Visit from 04/20/2023 in [facility] [place] Video Visit from 12/01/2022 [facility] Video Visit from 09[facility]30/2022 in [facility] [place] [employer] Visit from 08/25/2022 in [facility] [place]  Total GAD-7 Score 21 21 20 3 21       PHQ2-9    Flowsheet Row Office Visit from 10/05/2023 in [facility] [place] [employer] Visit from 04/20/2023 in [facility] [place] Video Visit from 12/01/2022 in [facility] Office Visit from 08/25/2022 in [facility] [place] Office Visit from 12/28/2021 in [facility] [place]  PHQ-2 Total Score 1 3 1 6  0  PHQ-9 Total Score 9 15 11 18 6       Flowsheet Row ED from  11/22/2023 in [facility] Most recent reading at 11/22/2023 10:34 AM Admission (Discharged) from 06/06/2023 in [facility] INPATIENT BE[facility]AL MEDICINE Most recent reading at 06/06/2023 11:00 PM ED from 06/06/2023 in [facility] at [facility] Most recent reading at 06/06/2023 10:22 AM  C-SSRS RISK CATEGORY No Risk Low Risk Low Risk       Collaboration of Care: Collaboration of Care: Medication Management AEB medication prescription and Other provider involved in patient's care AEB PCP and ED chart review  Patient/Guardian was advised Release of Information must be obtained prior to any record release in order to collaborate their care with an outside provider. Patient/Guardian was advised if they have not already done so to contact the registration department to sign all necessary forms in order for us  to release information regarding their care.   Consent: Patient/Guardian gives verbal consent for treatment and assignment of benefits for services provided during this visit. Patient/Guardian expressed understanding and agreed to proceed.    Yves Herb, MD 01/18/2024, 9:17 AM   Virtual Visit via Video Note  I connected with Morgan Chang on 01/18/24 at  9:00 AM EDT by a video enabled telemedicine application and verified that I am speaking with the correct person using two identifiers.  Location: Patient: Home Provider: Home Office   I discussed the limitations of evaluation and management by telemedicine and the availability of in person appointments. The patient expressed understanding and agreed to proceed.   I discussed the assessment and treatment plan with the patient. The patient was provided an opportunity to ask questions and all were answered. The patient agreed with the plan and demonstrated an understanding of the instructions.   The patient was advised  to call back or seek an in-person evaluation if the  symptoms worsen or if the condition fails to improve as anticipated.  I provided 25 minutes of non-face-to-face time during this encounter.   Yves Herb, MD

## 2024-01-18 ENCOUNTER — Telehealth (HOSPITAL_COMMUNITY): Admitting: Psychiatry

## 2024-01-18 ENCOUNTER — Encounter (HOSPITAL_COMMUNITY): Payer: Self-pay | Admitting: Psychiatry

## 2024-01-18 DIAGNOSIS — F411 Generalized anxiety disorder: Secondary | ICD-10-CM | POA: Diagnosis not present

## 2024-01-18 DIAGNOSIS — F410 Panic disorder [episodic paroxysmal anxiety] without agoraphobia: Secondary | ICD-10-CM

## 2024-01-18 DIAGNOSIS — F3181 Bipolar II disorder: Secondary | ICD-10-CM

## 2024-01-18 DIAGNOSIS — F430 Acute stress reaction: Secondary | ICD-10-CM

## 2024-01-18 DIAGNOSIS — Z8659 Personal history of other mental and behavioral disorders: Secondary | ICD-10-CM | POA: Diagnosis not present

## 2024-01-18 MED ORDER — LAMOTRIGINE 200 MG PO TABS: 200.0000 mg | ORAL_TABLET | Freq: Every day | ORAL | 0 refills | Status: DC

## 2024-01-18 MED ORDER — ZOLPIDEM TARTRATE 10 MG PO TABS: 10.0000 mg | ORAL_TABLET | Freq: Every evening | ORAL | 1 refills | Status: DC | PRN

## 2024-01-18 MED ORDER — VORTIOXETINE HBR 20 MG PO TABS: 20.0000 mg | ORAL_TABLET | Freq: Every day | ORAL | 2 refills | Status: DC

## 2024-01-22 ENCOUNTER — Other Ambulatory Visit: Payer: Self-pay

## 2024-01-22 ENCOUNTER — Telehealth (HOSPITAL_COMMUNITY): Payer: Self-pay | Admitting: *Deleted

## 2024-01-22 ENCOUNTER — Ambulatory Visit: Admitting: Internal Medicine

## 2024-01-22 ENCOUNTER — Ambulatory Visit
Admission: RE | Admit: 2024-01-22 | Discharge: 2024-01-22 | Disposition: A | Discharge status: Home / Self Care | Source: Ambulatory Visit | Attending: Internal Medicine | Admitting: Internal Medicine

## 2024-01-22 VITALS — BP 102/62 | HR 72 | Temp 98.0°F | Resp 16

## 2024-01-22 DIAGNOSIS — B379 Candidiasis, unspecified: Secondary | ICD-10-CM

## 2024-01-22 DIAGNOSIS — N6342 Unspecified lump in left breast, subareolar: Secondary | ICD-10-CM | POA: Diagnosis not present

## 2024-01-22 DIAGNOSIS — N61 Mastitis without abscess: Secondary | ICD-10-CM

## 2024-01-22 DIAGNOSIS — F3181 Bipolar II disorder: Secondary | ICD-10-CM

## 2024-01-22 DIAGNOSIS — T3695XA Adverse effect of unspecified systemic antibiotic, initial encounter: Secondary | ICD-10-CM

## 2024-01-22 DIAGNOSIS — F411 Generalized anxiety disorder: Secondary | ICD-10-CM

## 2024-01-22 MED ORDER — ZOLPIDEM TARTRATE ER 12.5 MG PO TBCR: 12.5000 mg | EXTENDED_RELEASE_TABLET | Freq: Every evening | ORAL | 0 refills | Status: DC | PRN

## 2024-01-22 MED ORDER — FLUCONAZOLE 150 MG PO TABS: 150.0000 mg | ORAL_TABLET | ORAL | 0 refills | Status: DC

## 2024-01-22 MED ORDER — CEPHALEXIN 500 MG PO CAPS: 500.0000 mg | ORAL_CAPSULE | Freq: Three times a day (TID) | ORAL | 0 refills | Status: AC

## 2024-01-22 NOTE — Telephone Encounter (Signed)
 Pt called requesting to switch back to Ambien  CR as the IR, she says, does not keep her asleep. Pt reports waking up at 0200 and not being able to fall back to sleep. This medication was changed on 01/18/24 visit. Pt is scheduled for f/u on 03/14/24. Please review.

## 2024-01-22 NOTE — ED Triage Notes (Signed)
 Pt states that she has a red, raised bump on her left breast. Pt states that this happened on Friday of last week. Pt states that there are red streaks going away from the bump. Pt tried heat, ice, OTC benadryl  and hydrocortisone  cream.

## 2024-01-22 NOTE — Telephone Encounter (Signed)
LVM for pt to advise.

## 2024-01-22 NOTE — Discharge Instructions (Signed)
 I suspect your left breast pain and swelling is due to infection, however I'd like for you to have an ultrasound/mammogram of the left breast to rule out underlying pathology/abnormality of the actual breast tissue.  The breast center at [facility] should be reaching out to you to schedule this in the next few days.  If you do not hear from them in the next few days, give them a phone call at the phone number listed below: [phone]  Take cephalexin  antibiotic every 8 hours for the next 7 days to treat suspected infection of the left breast. Apply warm compresses to the left breast to reduce redness and inflammation.  If you notice any new or worsening symptoms such as spreading redness that is not improving with antibiotic, fever, chills, nausea, vomiting, body aches, weight loss without trying, etc., please return to urgent care or go to the nearest emergency department for severe symptoms.  Follow-up with PCP.

## 2024-01-22 NOTE — ED Provider Notes (Addendum)
 [facility]    CSN: [id number] Arrival date & time: 01/22/24  1115      History   Chief Complaint Chief Complaint  Patient presents with   Insect Bite    Has double in size, red, and hard - Entered by patient    HPI Morgan Chang is a 28 y.o. female.   Patient presents to urgent care for evaluation of bump to the left breast near the left nipple with spreading redness to the diffuse inferior left breast starting 3 to 4 days ago.  She is unsure of what caused the bump in the redness, suspects she was bit by an insect but she is unsure.  This is never happened in the past.  She feels some swelling behind the left areola.  The redness has worsened over the last 24 to 48 hours and the palpable mass/swelling is tender.  She has not noticed any skin changes to the area, nipple inversion, or nipple discharge. She has nipple piercing's bilaterally and has had these for a few years, no recent changes to piercing. Denies recent injuries to the left breast and breast feeding. Denies recent weight loss without trying, fever, chills, nausea, vomiting, chest pain, and body aches. She has been using OTC hydrocortisone  cream and benadryl  with minimal relief of redness to the left breast. She has had an ultrasound of the right breast in the past, she has never had a mammogram. History of breast reduction surgery.      Past Medical History:  Diagnosis Date   Anemia 09/14/2021   Anxiety    Depression    Endometriosis    Fibroid    Frank breech presentation 10/29/2016   GERD (gastroesophageal reflux disease) 12/10/2014   Gestational diabetes    Migraines    Pregnancy induced hypertension    S/P cesarean section 10/29/2016    Patient Active Problem List   Diagnosis Date Noted   Positive ANA (antinuclear antibody) 07/24/2023   Fluctuation of weight 07/24/2023   Arthralgia 07/24/2023   Bipolar 2 disorder, major depressive episode (HCC) 06/06/2023   Insomnia 06/06/2023   Suicidal  ideation 06/06/2023   Marijuana dependence (HCC) 06/06/2023   Symptomatic mammary hypertrophy 09/06/2022   Panic attack as reaction to stress 04/08/2022   Panniculitis 12/13/2021   History of gestational hypertension 09/14/2021   Anemia 09/14/2021   Attention deficit hyperactivity disorder (ADHD), 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   Gestational diabetes mellitus (GDM), antepartum 04/14/2021   Generalized anxiety disorder 03/11/2020   Moderate episode of recurrent major depressive disorder (HCC) 03/11/2020   Sleep difficulties 03/11/2020   Mass of right breast 08/13/2018   Pain of breast 08/13/2018   External hemorrhoids 07/31/2016   Encephalopathy 05/15/2015   GERD (gastroesophageal reflux disease) 12/10/2014    Past Surgical History:  Procedure Laterality Date   ADENOIDECTOMY  10/29/2007   BREAST REDUCTION SURGERY     BREAST SURGERY  08/2015   CESAREAN SECTION N/A 10/29/2016   Procedure: CESAREAN SECTION;  Surgeon: Margaretmary Shaver, MD;  Location: [facility];  Service: Obstetrics;  Laterality: N/A;   CESAREAN SECTION N/A 07/25/2021   Procedure: Repeat CESAREAN SECTION;  Surgeon: Audelia Leaks, MD;  Location: [facility];  Service: Obstetrics;  Laterality: N/A;   Repeat C/S BTL   LAPAROSCOPY  12/10/2014   NISSEN FUNDOPLICATION     RHINOPLASTY  03/22/2010   deviated septum   TONSILLECTOMY  2017   TUBAL LIGATION  07/25/2021    OB History     Gravida  4   Para  2   Term  2   Preterm      AB  2   Living  2      SAB  2   IAB      Ectopic      Multiple  0   Live Births  1        Obstetric Comments  C/s for breech          Home Medications    Prior to Admission medications   Medication Sig Start Date End Date Taking? Authorizing Provider  cephALEXin  (KEFLEX ) 500 MG capsule Take 1 capsule (500 mg total) by mouth 3 (three) times daily for 7 days. 01/22/24 01/29/24 Yes StanhopeDanny Dye, FNP  fluconazole  (DIFLUCAN ) 150 MG tablet Take 1 tablet (150  mg total) by mouth every 3 (three) days. 01/22/24  Yes Starlene Eaton, FNP  clonazePAM  (KLONOPIN ) 0.5 MG tablet Take 1 tablet (0.5 mg total) by mouth daily as needed for anxiety. 11/20/23   Yves Herb, MD  cloNIDine  (CATAPRES ) 0.1 MG tablet Take 1 tablet (0.1 mg total) by mouth 3 (three) times daily as needed (anxiety). 12/08/23   Yves Herb, MD  lamoTRIgine  (LAMICTAL ) 200 MG tablet Take 1 tablet (200 mg total) by mouth daily for 11 days. Hold the prior prescription of Lamictal  200 mg daily.  We will retitrate back up to that dose over the next several weeks. 01/18/24 01/29/24  Yves Herb, MD  ondansetron  (ZOFRAN -ODT) 4 MG disintegrating tablet Take 1 tablet (4 mg total) by mouth every 8 (eight) hours as needed for nausea or vomiting. 11/22/23   Starlene Eaton, FNP  vortioxetine  HBr (TRINTELLIX ) 20 MG TABS tablet Take 1 tablet (20 mg total) by mouth daily. 01/18/24   Yves Herb, MD  zolpidem  (AMBIEN ) 10 MG tablet Take 1 tablet (10 mg total) by mouth at bedtime as needed for sleep. 01/18/24 02/17/24  Yves Herb, MD    Family History Family History  Problem Relation Age of Onset   Heart disease Mother    Hypertension Mother    Anxiety disorder Mother    Depression Mother    Alcohol abuse Mother    Arthritis Mother    Hypertension Father    Anxiety disorder Father    Depression Father    Heart attack Father    Heart failure Father    Testicular cancer Brother    Diabetes Maternal Grandmother    Heart attack Maternal Grandfather        x 2    Social History Social History   Tobacco Use   Smoking status: Never    Passive exposure: Never   Smokeless tobacco: Never  Vaping Use   Vaping status: Never Used  Substance Use Topics   Alcohol use: Yes    Comment: less than one drink per month   Drug use: No     Allergies   Patient has no known allergies.   Review of Systems Review of Systems Per HPI  Physical Exam Triage Vital Signs ED Triage Vitals  [01/22/24 1123]  Encounter Vitals Group     BP 102/62     Systolic BP Percentile      Diastolic BP Percentile      Pulse Rate 72     Resp 16     Temp 98 F (36.7 C)     Temp Source Oral     SpO2 98 %  Weight      Height      Head Circumference      Peak Flow      Pain Score 5     Pain Loc      Pain Education      Exclude from Growth Chart    No data found.  Updated Vital Signs BP 102/62 (BP Location: Right Arm)   Pulse 72   Temp 98 F (36.7 C) (Oral)   Resp 16   LMP 12/22/2023 (Exact Date)   SpO2 98%   Visual Acuity Right Eye Distance:   Left Eye Distance:   Bilateral Distance:    Right Eye Near:   Left Eye Near:    Bilateral Near:     Physical Exam Vitals and nursing note reviewed. Exam conducted with a chaperone present Lane Pinon, RN present for breast exam).  Constitutional:      Appearance: She is not ill-appearing or toxic-appearing.  HENT:     Head: Normocephalic and atraumatic.     Right Ear: Hearing and external ear normal.     Left Ear: Hearing and external ear normal.     Nose: Nose normal.     Mouth/Throat:     Lips: Pink.  Eyes:     General: Lids are normal. Vision grossly intact. Gaze aligned appropriately.     Extraocular Movements: Extraocular movements intact.     Conjunctiva/sclera: Conjunctivae normal.  Pulmonary:     Effort: Pulmonary effort is normal.  Chest:  Breasts:    Right: Normal.     Left: Swelling, mass (subareolar), skin change and tenderness present. No bleeding, inverted nipple or nipple discharge.       Comments: Palpable approximately 1cm by 1-2cm subareolar mass to the 6 o'clock position of the areola with dimpling of the overlying tissue. Tender to palpation over the area of swelling to the subareolar region.  Musculoskeletal:     Cervical back: Neck supple.  Skin:    General: Skin is warm and dry.     Capillary Refill: Capillary refill takes less than 2 seconds.     Findings: No rash.  Neurological:      General: No focal deficit present.     Mental Status: She is alert and oriented to person, place, and time. Mental status is at baseline.     Cranial Nerves: No dysarthria or facial asymmetry.  Psychiatric:        Mood and Affect: Mood normal.        Speech: Speech normal.        Behavior: Behavior normal.        Thought Content: Thought content normal.        Judgment: Judgment normal.      UC Treatments / Results  Labs (all labs ordered are listed, but only abnormal results are displayed) Labs Reviewed - No data to display  EKG   Radiology No results found.  Procedures Procedures (including critical care time)  Medications Ordered in UC Medications - No data to display  Initial Impression / Assessment and Plan / UC Course  I have reviewed the triage vital signs and the nursing notes.  Pertinent labs & imaging results that were available during my care of the patient were reviewed by me and considered in my medical decision making (see chart for details).   1. Cellulitis of left breast, subareolar mass of left breast, antibiotic induced yeast infection Presentation consistent with cellulitis of the left breast, however there is a  palpable subareolar mass to the left areola at the 6:00 region.  Mass is tender to palpation.  Nonmovable. Cephalexin  3 times daily for 7 days ordered to treat cellulitis of the left breast. Ultrasound and mammogram of the left breast ordered to rule out underlying pathology/breast tissue abnormality. Staff from the breast center at [facility] will call to set up appointment for mammo/ultrasound.  Warm compresses in the meantime encouraged. Tylenol /Motrin  as needed for pain. Infection/red flag return precautions discussed.  Patient reports she frequently has vaginal yeast infections when taking antibiotics and requests Diflucan  for prophylactic treatment.  This has been sent.  Counseled patient on potential for adverse effects with  medications prescribed/recommended today, strict ER and return-to-clinic precautions discussed, patient verbalized understanding.    Final Clinical Impressions(s) / UC Diagnoses   Final diagnoses:  Cellulitis of left breast  Subareolar mass of left breast  Antibiotic-induced yeast infection     Discharge Instructions      I suspect your left breast pain and swelling is due to infection, however I'd like for you to have an ultrasound/mammogram of the left breast to rule out underlying pathology/abnormality of the actual breast tissue.  The breast center at [facility] should be reaching out to you to schedule this in the next few days.  If you do not hear from them in the next few days, give them a phone call at the phone number listed below: [phone]  Take cephalexin  antibiotic every 8 hours for the next 7 days to treat suspected infection of the left breast. Apply warm compresses to the left breast to reduce redness and inflammation.  If you notice any new or worsening symptoms such as spreading redness that is not improving with antibiotic, fever, chills, nausea, vomiting, body aches, weight loss without trying, etc., please return to urgent care or go to the nearest emergency department for severe symptoms.  Follow-up with PCP.     ED Prescriptions     Medication Sig Dispense Auth. Provider   cephALEXin  (KEFLEX ) 500 MG capsule Take 1 capsule (500 mg total) by mouth 3 (three) times daily for 7 days. 21 capsule Starlene Eaton, FNP   fluconazole  (DIFLUCAN ) 150 MG tablet Take 1 tablet (150 mg total) by mouth every 3 (three) days. 2 tablet Starlene Eaton, FNP      PDMP not reviewed this encounter.   Starlene Eaton, FNP 01/22/24 1220    Starlene Eaton, [place] 01/22/24 1221

## 2024-02-14 ENCOUNTER — Telehealth (INDEPENDENT_AMBULATORY_CARE_PROVIDER_SITE_OTHER): Admitting: Physician Assistant

## 2024-02-14 ENCOUNTER — Ambulatory Visit: Admitting: Physician Assistant

## 2024-02-14 DIAGNOSIS — Z91199 Patient's noncompliance with other medical treatment and regimen due to unspecified reason: Secondary | ICD-10-CM

## 2024-02-14 NOTE — Progress Notes (Signed)
 The patient no-showed for appointment despite this provider sending direct link, reaching out via phone with no response and waiting for at least 10 minutes from appointment time for patient to join. They will be marked as a NS for this appointment/time.   [name], [name]

## 2024-03-07 ENCOUNTER — Other Ambulatory Visit (HOSPITAL_COMMUNITY): Payer: Self-pay | Admitting: Psychiatry

## 2024-03-07 DIAGNOSIS — F3181 Bipolar II disorder: Secondary | ICD-10-CM

## 2024-03-07 DIAGNOSIS — F411 Generalized anxiety disorder: Secondary | ICD-10-CM

## 2024-03-11 NOTE — Progress Notes (Deleted)
 BH MD/PA/NP OP Progress Note  03/11/2024 11:16 AM Morgan Chang  MRN:  [id number]  Visit Diagnosis:  No diagnosis found.   Assessment: Morgan Chang is a 28 y.o. female with a history of MDD, sleep difficulties and reported ADHD who presented to [facility] at [facility] for initial evaluation on 12/02/2022.  During initial evaluation patient reported significant symptoms of anxiety including excessive worry control, difficulty relaxing, racing thoughts worse at night that affects sleep, restlessness, increased irritability, and fears about happening.  She also endorsed experiencing panic attacks which started in December 2023 and occur once every couple days.  During the episodes of panic patient reported symptoms of diaphoresis, increased restlessness, picking behaviors, and dissociations.  Patient also does note some depressive symptoms, that appear to be a bit better managed after starting the Trintellix .  She denied any SI or thoughts of self-harm along with any history of mania or psychosis. Patient met criteria for generalized anxiety disorder and panic attacks.   Over the course of treatment patient had an episode which was concerning for hypomania in July-August of 2024.  Patient had described increased irritability, mood lability, negative self thoughts, feelings of worthlessness, insomnia, and passive SI for a 4-day period. Patient had presented to [facility] who started Abilify  and patient noted some improvement in her symptoms following this. With this episode patient also meets criteria for Bipolar 2 disorder.  Morgan Chang presents for follow-up evaluation. Today, 03/11/24, patient reported that    mood and anxiety have improved back to her baseline.  At this point she only experiences situational anxiety that is easily resolved and has been needing the as needed clonidine  less frequently.  Only concern is regarding insomnia which is still improved however patient  has noticed decreased benefit with the Ambien  CR.  We will transition back to the Ambien  immediate release and reviewed risk and benefits including decreased metabolism and females.  Patient expressed her understanding.  We will continue on the remainder of current regimen and follow up in 2 months.  Plan: - Continue Trintellix  20 mg daily [facility] - Continue Lamictal  200 mg daily - Continue clonidine  0.1 mg to TID prn for anxiety - Continue Klonopin  0.5 mg every day prn for anxiety - Change Ambien  to CR 12.5 mg QHS - CMP, CBC, TSH, Vit D, cortisol reviewed - Crisis resources reviewed - Therapy referral, going to start at [facility] psychology therapy and working through [employer] - Follow up in 2 months  Chief Complaint:  No chief complaint on file.  HPI: Ambien  changed to CR in the interim due to lack of improvement when switching to Ambien  IR.   On presentation today Morgan Chang reports that     things have been really good over the past 6 weeks.  She notes this is the best she has felt in a long time.  Her mood has been stable and her anxiety has been easily managed.  She can still experience situational episodes of anxiety typically around the kids and trying to get out the door but she is able to calm down without significant issue.  She is still using the clonidine  with good success though has needed it less frequently compared to the past.  As for the Klonopin  the only time she used it in the interim was when she misplaced the clonidine .  Overall she finds the clonidine  more effective for her anxiety than the Klonopin .  Patient has titrated Lamictal  back to 200  mg daily denying any adverse side effects.  Early concern at this time his regarding sleep.  She has tried the Ambien  extended release however is not finding it to be any more beneficial than the immediate release formulary.  That being the case she was interested in going back to the immediate release.  We  discussed this at her agreeable to switching back to the immediate release though did review that the 10 mg dose is above recommended dosing females.  Patient aware of decreased metabolism of medication in females and we will monitor for any concerns.  Past Psychiatric History: The patient has one prior psychiatric hospitalization to [facility] in September of 2024 due to worsening depression and SI. She denies any suicide attempts.  She has been connected with a couple therapists in the past, though is currently not seeing anyone after her last therapist left the practice in January.   She has tried mirtazapine , Wellbutrin , Prozac, Zoloft , Lexapro (headaches), Celexa , Trintellix , BuSpar, propranolol  (helped with akathisia, Atarax  (sleepy), Seroquel  (hair loss), Zyprexa  (oral motor issues), Risperdal  (lactation), trazodone, Depakote, Adderall, Vyvanse , Ritalin , Klonopin , Xanax, Lunesta , Ambien   Patient currently taking Vyvanse , Trintellix , Ambien , and gabapentin .  She denies any substance use denies other than one alcoholic drink a week.   Past Medical History:  Past Medical History:  Diagnosis Date   Anemia 09/14/2021   Anxiety    Depression    Endometriosis    Fibroid    Frank breech presentation 10/29/2016   GERD (gastroesophageal reflux disease) 12/10/2014   Gestational diabetes    Migraines    Pregnancy induced hypertension    S/P cesarean section 10/29/2016    Past Surgical History:  Procedure Laterality Date   ADENOIDECTOMY  10/29/2007   BREAST REDUCTION SURGERY     BREAST SURGERY  08/2015   CESAREAN SECTION N/A 10/29/2016   Procedure: CESAREAN SECTION;  Surgeon: Ezzie Buba, MD;  Location: [facility];  Service: Obstetrics;  Laterality: N/A;   CESAREAN SECTION N/A 07/25/2021   Procedure: Repeat CESAREAN SECTION;  Surgeon: Kandyce Sor, MD;  Location: [facility] LD ORS;  Service: Obstetrics;  Laterality: N/A;   Repeat C/S BTL   LAPAROSCOPY  12/10/2014   NISSEN  FUNDOPLICATION     RHINOPLASTY  03/22/2010   deviated septum   TONSILLECTOMY  2017   TUBAL LIGATION  07/25/2021    Family History:  Family History  Problem Relation Age of Onset   Heart disease Mother    Hypertension Mother    Anxiety disorder Mother    Depression Mother    Alcohol abuse Mother    Arthritis Mother    Hypertension Father    Anxiety disorder Father    Depression Father    Heart attack Father    Heart failure Father    Testicular cancer Brother    Diabetes Maternal Grandmother    Heart attack Maternal Grandfather        x 2    Social History:  Social History   Socioeconomic History   Marital status: Married    Spouse name: Not on file   Number of children: Not on file   Years of education: Not on file   Highest education level: Not on file  Occupational History   Occupation: Homemaker  Tobacco Use   Smoking status: Never    Passive exposure: Never   Smokeless tobacco: Never  Vaping Use   Vaping status: Never Used  Substance and Sexual Activity   Alcohol use: Yes  Comment: less than one drink per month   Drug use: No   Sexual activity: Yes    Birth control/protection: Surgical  Other Topics Concern   Not on file  Social History Narrative   Stay at home   2 children -- 66 year old son and 53 year old daughter (2024)   Married   Social Drivers of Corporate investment banker Strain: Not on file  Food Insecurity: No Food Insecurity (06/06/2023)   Hunger Vital Sign    Worried About Running Out of Food in the Last Year: Never true    Ran Out of Food in the Last Year: Never true  Transportation Needs: No Transportation Needs (06/06/2023)   PRAPARE - Administrator, Civil Service (Medical): No    Lack of Transportation (Non-Medical): No  Physical Activity: Not on file  Stress: Not on file  Social Connections: Unknown (04/04/2023)   Received from [facility]   Social Network    Social Network: Not on file    Allergies: No Known  Allergies  Current Medications: Current Outpatient Medications  Medication Sig Dispense Refill   clonazePAM  (KLONOPIN ) 0.5 MG tablet Take 1 tablet (0.5 mg total) by mouth daily as needed for anxiety. 30 tablet 0   cloNIDine  (CATAPRES ) 0.1 MG tablet Take 1 tablet (0.1 mg total) by mouth 3 (three) times daily as needed (anxiety). 90 tablet 2   fluconazole  (DIFLUCAN ) 150 MG tablet Take 1 tablet (150 mg total) by mouth every 3 (three) days. 2 tablet 0   lamoTRIgine  (LAMICTAL ) 200 MG tablet Take 1 tablet (200 mg total) by mouth daily for 11 days. Hold the prior prescription of Lamictal  200 mg daily.  We will retitrate back up to that dose over the next several weeks. 90 tablet 0   ondansetron  (ZOFRAN -ODT) 4 MG disintegrating tablet Take 1 tablet (4 mg total) by mouth every 8 (eight) hours as needed for nausea or vomiting. 20 tablet 0   vortioxetine  HBr (TRINTELLIX ) 20 MG TABS tablet Take 1 tablet (20 mg total) by mouth daily. 30 tablet 2   zolpidem  (AMBIEN  CR) 12.5 MG CR tablet TAKE 1 TABLET BY MOUTH AT BEDTIME AS NEEDED FOR SLEEP. 30 tablet 0   No current facility-administered medications for this visit.     Psychiatric Specialty Exam: Review of Systems  There were no vitals taken for this visit.There is no height or weight on file to calculate BMI.  General Appearance: Fairly Groomed  Eye Contact:  Good  Speech:  Clear and Coherent and Pressured  Volume:  Normal  Mood:  Euthymic  Affect:  Congruent  Thought Process:  Coherent and Goal Directed  Orientation:  Full (Time, Place, and Person)  Thought Content: Logical   Suicidal Thoughts:  No  Homicidal Thoughts:  No  Memory:  Immediate;   Good  Judgement:  Good  Insight:  Fair  Psychomotor Activity:  Normal  Concentration:  Concentration: Good  Recall:  Good  Fund of Knowledge: Fair  Language: Good  Akathisia:  NA    AIMS (if indicated): not done  Assets:  Communication Skills Desire for  Improvement Housing Talents/Skills Transportation  ADL's:  Intact  Cognition: WNL  Sleep:  Fair   Metabolic Disorder Labs: Lab Results  Component Value Date   HGBA1C 5.3 10/05/2023   No results found for: PROLACTIN Lab Results  Component Value Date   CHOL 167 04/20/2023   TRIG 99.0 04/20/2023   HDL 56.30 04/20/2023   CHOLHDL  3 04/20/2023   VLDL 19.8 04/20/2023   LDLCALC 91 04/20/2023   Lab Results  Component Value Date   TSH 0.46 10/05/2023   TSH 1.34 03/30/2022    Therapeutic Level Labs: No results found for: LITHIUM No results found for: VALPROATE No results found for: CBMZ   Screenings: AUDIT    Flowsheet Row Admission (Discharged) from 06/06/2023 in [facility] INPATIENT BE[facility]AVIORAL MEDICINE  Alcohol Use Disorder Identification Test Final Score (AUDIT) 0   GAD-7    Flowsheet Row Office Visit from 10/05/2023 in [facility] [place] [employer] Visit from 04/20/2023 in [facility] [place] Video Visit from 12/01/2022 [facility] Video Visit from 09/30/2022 in [facility] [place] [employer] Visit from 08/25/2022 in [facility] [place]  Total GAD-7 Score 21 21 20 3 21    PHQ2-9    Flowsheet Row Office Visit from 10/05/2023 in [name] [place] Office Visit from 04/20/2023 in [facility] [place] Video Visit from 12/01/2022 [facility] Office Visit from 08[facility]25/2022 in [facility] [place] Office Visit from 12/28/2021 in [facility] [place]  PHQ-2 Total Score 1 3 1 6  0  PHQ-9 Total Score 9 15 11 18 6    Flowsheet Row UC from 01/22/2024 in [facility] UC from 11/22/2023 in [facility]  Admission (Discharged) from 06/06/2023 in [facility]PATIENT [facility]ICINE  C-SSRS RISK CATEGORY No Risk No Risk Low Risk    Collaboration of Care: Collaboration of Care: Medication Management AEB medication prescription  Patient/Guardian was advised Release of Information must be obtained prior to any record release in order to collaborate their care with an outside provider. Patient/Guardian was advised if they have not already done so to contact the registration department to sign all necessary forms in order for us  to release information regarding their care.   Consent: Patient/Guardian gives verbal consent for treatment and assignment of benefits for services provided during this visit. Patient/Guardian expressed understanding and agreed to proceed.    Arvella CHRISTELLA Finder, MD 03/11/2024, 11:16 AM   Virtual Visit via Video Note  I connected with Tena Lamke on 03/11/24 at  8:00 AM EDT by a video enabled telemedicine application and verified that I am speaking with the correct person using two identifiers.  Location: Patient: Home Provider: Home Office   I discussed the limitations of evaluation and management by telemedicine and the availability of in person appointments. The patient expressed understanding and agreed to proceed.   I discussed the assessment and treatment plan with the patient. The patient was provided an opportunity to ask questions and all were answered. The patient agreed with the plan and demonstrated an understanding of the instructions.   The patient was advised to call back or seek an in-person evaluation if the symptoms worsen or if the condition fails to improve as anticipated.  I provided 25 minutes of non-face-to-face time during this encounter.   Arvella CHRISTELLA Finder, MD

## 2024-03-14 ENCOUNTER — Encounter (HOSPITAL_COMMUNITY): Payer: Self-pay | Admitting: Psychiatry

## 2024-03-14 ENCOUNTER — Telehealth (HOSPITAL_COMMUNITY): Admitting: Psychiatry

## 2024-03-14 DIAGNOSIS — F3181 Bipolar II disorder: Secondary | ICD-10-CM | POA: Diagnosis not present

## 2024-03-14 DIAGNOSIS — F410 Panic disorder [episodic paroxysmal anxiety] without agoraphobia: Secondary | ICD-10-CM | POA: Diagnosis not present

## 2024-03-14 DIAGNOSIS — F430 Acute stress reaction: Secondary | ICD-10-CM

## 2024-03-14 DIAGNOSIS — F411 Generalized anxiety disorder: Secondary | ICD-10-CM | POA: Diagnosis not present

## 2024-03-14 MED ORDER — LAMOTRIGINE 200 MG PO TABS: 200.0000 mg | ORAL_TABLET | Freq: Every day | ORAL | 0 refills | Status: DC

## 2024-03-14 MED ORDER — ZOLPIDEM TARTRATE ER 12.5 MG PO TBCR: 12.5000 mg | EXTENDED_RELEASE_TABLET | Freq: Every evening | ORAL | 0 refills | Status: DC | PRN

## 2024-03-14 MED ORDER — CLONIDINE HCL 0.1 MG PO TABS: 0.1000 mg | ORAL_TABLET | Freq: Three times a day (TID) | ORAL | 2 refills | Status: DC | PRN

## 2024-03-14 MED ORDER — FLUOXETINE HCL 10 MG PO CAPS: ORAL_CAPSULE | ORAL | 1 refills | Status: DC

## 2024-03-14 NOTE — Progress Notes (Signed)
 BH MD/PA/NP OP Progress Note  03/14/2024 4:28 PM Morgan Chang  MRN:  [id number]  Visit Diagnosis:    ICD-10-CM   1. Generalized anxiety disorder  F41.1 cloNIDine  (CATAPRES ) 0.1 MG tablet    zolpidem  (AMBIEN  CR) 12.5 MG CR tablet    FLUoxetine (PROZAC) 10 MG capsule    2. Panic attack as reaction to stress  F41.0 cloNIDine  (CATAPRES ) 0.1 MG tablet   F43.0 FLUoxetine (PROZAC) 10 MG capsule    3. Bipolar 2 disorder (HCC)  F31.81 zolpidem  (AMBIEN  CR) 12.5 MG CR tablet    lamoTRIgine  (LAMICTAL ) 200 MG tablet       Assessment: Morgan Chang is a 28 y.o. female with a history of MDD, sleep difficulties and reported ADHD who presented to [facility] at [facility] for initial evaluation on 12/02/2022.  During initial evaluation patient reported significant symptoms of anxiety including excessive worry control, difficulty relaxing, racing thoughts worse at night that affects sleep, restlessness, increased irritability, and fears about happening.  She also endorsed experiencing panic attacks which started in December 2023 and occur once every couple days.  During the episodes of panic patient reported symptoms of diaphoresis, increased restlessness, picking behaviors, and dissociations.  Patient also does note some depressive symptoms, that appear to be a bit better managed after starting the Trintellix .  She denied any SI or thoughts of self-harm along with any history of mania or psychosis. Patient met criteria for generalized anxiety disorder and panic attacks.   Over the course of treatment patient had an episode which was concerning for hypomania in July-August of 2024.  Patient had described increased irritability, mood lability, negative self thoughts, feelings of worthlessness, insomnia, and passive SI for a 4-day period. Patient had presented to [facility] who started Abilify  and patient noted some improvement in her symptoms following this. With this episode patient also meets  criteria for Bipolar 2 disorder.  Morgan Chang presents for follow-up evaluation. Today, 03/14/24, patient reported that moods been stable and sleep is well controlled.  Anxiety still been an issue with the constant baseline anxiety that gradually is worse throughout the day.  Symptoms are primarily mental during beginning the day though progressed to more physical whether the goes on.  She has noticed that social situations can increase her anxiety.  Patient has found limited benefit from Trintellix .  We will taper off and start Prozac today.  Risk and benefits of this transition were discussed.  Need to watch out for progression of hypomania was reviewed.  We will continue on the remainder of her current regimen and follow up in 6 weeks.  Plan: - Discontinue Trintellix  to 10 mg daily for 2 weeks before discontinuing  - Start Prozac 10 mg daily for 2 weeks before increasing to 20 mg daily - Continue Lamictal  200 mg daily - Continue clonidine  0.1 mg to TID prn for anxiety - Continue Klonopin  0.5 mg every day prn for anxiety - Change Ambien  to CR 12.5 mg QHS - CMP, CBC, TSH, Vit D, cortisol reviewed - Crisis resources reviewed - Therapy referral, going to start at [facility] psychology therapy and working through [employer] - Follow up in 2 months  Chief Complaint:  Chief Complaint  Patient presents with   Follow-up   HPI: Ambien  changed to CR in the interim due to lack of improvement when switching to Ambien  IR.   On presentation today Morgan Chang reports that things are going pretty well for her. This is the longest that she has gone without  going into a depressive episode.  She feels like she is stabilizing lamotrigine  and denies any adverse side effects.  Her insomnia has also improved with the Ambien  CR and she has been sleeping well at night.  A concern today is regards to her anxiety.  Patient reports that she is not noticing significant benefit from the Trintellix  and is experiencing the anxiety  throughout the day.  Starts in the mornings a more mental feeling and then progresses later on to come more physical.  She describes feeling body aches, chest pressure, lump in her throat, and occasionally shortness of breath.  She does take the clonidine  3 times a day with good effect though can tell when the medicines no longer in her system.  Patient has not used the Klonopin  in over a month.  As has been limited benefit with the Trintellix  we did discuss alternative options.  Patient has tried several SSRI/SNRI medications in the past.  Besides Lexapro, Zoloft , and Trintellix  all of these trials were over 7 years ago before patient's bipolar disorder was adequately managed.  Told that the medication failures from these were related to other untreated symptoms.  At being the case we will retrial of Prozac today and reviewed the risk and benefits.  Did caution for signs of hypomania.  Past Psychiatric History: The patient has one prior psychiatric hospitalization to [facility] in September of 2024 due to worsening depression and SI. She denies any suicide attempts.  She has been connected with a couple therapists in the past, though is currently not seeing anyone after her last therapist left the practice in January.   She has tried mirtazapine , Wellbutrin , Prozac, Zoloft , Lexapro (headaches), Celexa , Trintellix , venlafaxine (orthostatic hypotension), BuSpar, propranolol  (helped with akathisia, Atarax  (sleepy), Seroquel  (hair loss), Zyprexa  (oral motor issues), Risperdal  (lactation), trazodone, Depakote, Adderall, Vyvanse , Ritalin , Klonopin , Xanax, Lunesta , Ambien   Patient currently taking Vyvanse , Trintellix , Ambien , and gabapentin .  She denies any substance use denies other than one alcoholic drink a week.   Past Medical History:  Past Medical History:  Diagnosis Date   Anemia 09/14/2021   Anxiety    Depression    Endometriosis    Fibroid    Frank breech presentation 10/29/2016   GERD  (gastroesophageal reflux disease) 12/10/2014   Gestational diabetes    Migraines    Pregnancy induced hypertension    S/P cesarean section 10/29/2016    Past Surgical History:  Procedure Laterality Date   ADENOIDECTOMY  10/29/2007   BREAST REDUCTION SURGERY     BREAST SURGERY  08/2015   CESAREAN SECTION N/A 10/29/2016   Procedure: CESAREAN SECTION;  Surgeon: Ezzie Buba, MD;  Location: [facility];  Service: Obstetrics;  Laterality: N/A;   CESAREAN SECTION N/A 07/25/2021   Procedure: Repeat CESAREAN SECTION;  Surgeon: Kandyce Sor, MD;  Location: [facility] LD ORS;  Service: Obstetrics;  Laterality: N/A;   Repeat C/S BTL   LAPAROSCOPY  12/10/2014   NISSEN FUNDOPLICATION     RHINOPLASTY  03/22/2010   deviated septum   TONSILLECTOMY  2017   TUBAL LIGATION  07/25/2021    Family History:  Family History  Problem Relation Age of Onset   Heart disease Mother    Hypertension Mother    Anxiety disorder Mother    Depression Mother    Alcohol abuse Mother    Arthritis Mother    Hypertension Father    Anxiety disorder Father    Depression Father    Heart attack Father    Heart failure  Father    Testicular cancer Brother    Diabetes Maternal Grandmother    Heart attack Maternal Grandfather        x 2    Social History:  Social History   Socioeconomic History   Marital status: Married    Spouse name: Not on file   Number of children: Not on file   Years of education: Not on file   Highest education level: Not on file  Occupational History   Occupation: Homemaker  Tobacco Use   Smoking status: Never    Passive exposure: Never   Smokeless tobacco: Never  Vaping Use   Vaping status: Never Used  Substance and Sexual Activity   Alcohol use: Yes    Comment: less than one drink per month   Drug use: No   Sexual activity: Yes    Birth control/protection: Surgical  Other Topics Concern   Not on file  Social History Narrative   Stay at home   2 children -- 31  year old son and 33 year old daughter (2024)   Married   Social Drivers of Corporate investment banker Strain: Not on file  Food Insecurity: No Food Insecurity (06/06/2023)   Hunger Vital Sign    Worried About Running Out of Food in the Last Year: Never true    Ran Out of Food in the Last Year: Never true  Transportation Needs: No Transportation Needs (06/06/2023)   PRAPARE - Administrator, Civil Service (Medical): No    Lack of Transportation (Non-Medical): No  Physical Activity: Not on file  Stress: Not on file  Social Connections: Unknown (04/04/2023)   Received from [facility]   Social Network    Social Network: Not on file    Allergies: No Known Allergies  Current Medications: Current Outpatient Medications  Medication Sig Dispense Refill   FLUoxetine (PROZAC) 10 MG capsule Take 1 capsule (10 mg total) by mouth daily for 14 days, THEN 2 capsules (20 mg total) daily for 16 days. 46 capsule 1   clonazePAM  (KLONOPIN ) 0.5 MG tablet Take 1 tablet (0.5 mg total) by mouth daily as needed for anxiety. 30 tablet 0   cloNIDine  (CATAPRES ) 0.1 MG tablet Take 1 tablet (0.1 mg total) by mouth 3 (three) times daily as needed (anxiety). 90 tablet 2   fluconazole  (DIFLUCAN ) 150 MG tablet Take 1 tablet (150 mg total) by mouth every 3 (three) days. 2 tablet 0   lamoTRIgine  (LAMICTAL ) 200 MG tablet Take 1 tablet (200 mg total) by mouth daily for 11 days. 90 tablet 0   ondansetron  (ZOFRAN -ODT) 4 MG disintegrating tablet Take 1 tablet (4 mg total) by mouth every 8 (eight) hours as needed for nausea or vomiting. 20 tablet 0   vortioxetine  HBr (TRINTELLIX ) 20 MG TABS tablet Take 1 tablet (20 mg total) by mouth daily. 30 tablet 2   [START ON 04/03/2024] zolpidem  (AMBIEN  CR) 12.5 MG CR tablet Take 1 tablet (12.5 mg total) by mouth at bedtime as needed. for sleep 30 tablet 0   No current facility-administered medications for this visit.     Psychiatric Specialty Exam: Review of Systems   There were no vitals taken for this visit.There is no height or weight on file to calculate BMI.  General Appearance: Fairly Groomed  Eye Contact:  Good  Speech:  Clear and Coherent and Pressured  Volume:  Normal  Mood:  Euthymic  Affect:  Congruent  Thought Process:  Coherent and Goal Directed  Orientation:  Full (Time, Place, and Person)  Thought Content: Logical   Suicidal Thoughts:  No  Homicidal Thoughts:  No  Memory:  Immediate;   Good  Judgement:  Good  Insight:  Fair  Psychomotor Activity:  Normal  Concentration:  Concentration: Good  Recall:  Good  Fund of Knowledge: Fair  Language: Good  Akathisia:  NA    AIMS (if indicated): not done  Assets:  Communication Skills Desire for Improvement Housing Talents/Skills Transportation  ADL's:  Intact  Cognition: WNL  Sleep:  Fair   Metabolic Disorder Labs: Lab Results  Component Value Date   HGBA1C 5.3 10/05/2023   No results found for: PROLACTIN Lab Results  Component Value Date   CHOL 167 04/20/2023   TRIG 99.0 04/20/2023   HDL 56.30 04/20/2023   CHOLHDL 3 04/20/2023   VLDL 19.8 04/20/2023   LDLCALC 91 04/20/2023   Lab Results  Component Value Date   TSH 0.46 10/05/2023   TSH 1.34 03/30/2022    Therapeutic Level Labs: No results found for: LITHIUM No results found for: VALPROATE No results found for: CBMZ   Screenings: AUDIT    Flowsheet Row Admission (Discharged) from 06/06/2023 in [facility]  Alcohol Use Disorder Identification Test Final Score (AUDIT) 0   GAD-7    Flowsheet Row Office Visit from 10/05/2023 in [facility] [place] [employer] [facility] Visit from 04/20/2023 in [facility] [place] Video Visit from 12/01/2022 [facility] Video Visit from 09/30/2022 in [facility] [place] [employer] Visit from 08/25/2022 in [facility] at  [place]  Total GAD-7 Score 21 21 20 3 21    PHQ2-9    Flowsheet Row Office Visit from 10/05/2023 in [facility] [place] [employer] Visit from 04/20/2023 in [facility] [place] Video Visit from 12/01/2022 [facility] Office Visit from 08[facility]25/2022 in [facility] [place] [employer] Visit from 12/28/2021 in [facility] [place]  PHQ-2 Total Score 1 3 1 6  0  PHQ-9 Total Score 9 15 11 18 6    Flowsheet Row UC from 01/22/2024 in [facility]) UC from 11/22/2023 in [facility] Admission (Discharged) from 06/06/2023 in [facility] INPATIENT BE[facility]AVIORAL MEDICINE  C-SSRS RISK CATEGORY No Risk No Risk Low Risk    Collaboration of Care: Collaboration of Care: Medication Management AEB medication prescription  Patient/Guardian was advised Release of Information must be obtained prior to any record release in order to collaborate their care with an outside provider. Patient/Guardian was advised if they have not already done so to contact the registration department to sign all necessary forms in order for us  to release information regarding their care.   Consent: Patient/Guardian gives verbal consent for treatment and assignment of benefits for services provided during this visit. Patient/Guardian expressed understanding and agreed to proceed.    Morgan CHRISTELLA Finder, MD 03/14/2024, 4:28 PM   Virtual Visit via Video Note  I connected with Robertta Angelo on 03/14/24 at  4:00 PM EDT by a video enabled telemedicine application and verified that I am speaking with the correct person using two identifiers.  Location: Patient: Home Provider: Home Office   I discussed the limitations of evaluation and management by telemedicine and the availability  of in person appointments. The patient  expressed understanding and agreed to proceed.   I discussed the assessment and treatment plan with the patient. The patient was provided an opportunity to ask questions and all were answered. The patient agreed with the plan and demonstrated an understanding of the instructions.   The patient was advised to call back or seek an in-person evaluation if the symptoms worsen or if the condition fails to improve as anticipated.  I provided 25 minutes of non-face-to-face time during this encounter.   Morgan CHRISTELLA Finder, MD

## 2024-03-28 ENCOUNTER — Other Ambulatory Visit (HOSPITAL_COMMUNITY): Payer: Self-pay | Admitting: *Deleted

## 2024-03-28 ENCOUNTER — Telehealth (HOSPITAL_COMMUNITY): Payer: Self-pay | Admitting: *Deleted

## 2024-03-28 DIAGNOSIS — F3181 Bipolar II disorder: Secondary | ICD-10-CM

## 2024-03-28 MED ORDER — LAMOTRIGINE 200 MG PO TABS: 200.0000 mg | ORAL_TABLET | Freq: Every day | ORAL | 0 refills | Status: DC

## 2024-03-28 NOTE — Telephone Encounter (Signed)
 Pt called stating that she hasn't been sleeping and needs Ambien  refill. Says she's afraid she'll go into an episode if she doesn't get some sleep. Last script filled on 03/07/24 per pharmacy. Can be refilled on 04/05/24. Please clarify SIG on the Lamictal .

## 2024-04-08 ENCOUNTER — Other Ambulatory Visit (INDEPENDENT_AMBULATORY_CARE_PROVIDER_SITE_OTHER)

## 2024-04-08 ENCOUNTER — Ambulatory Visit: Payer: Self-pay | Admitting: Physician Assistant

## 2024-04-08 ENCOUNTER — Encounter: Payer: Self-pay | Admitting: Physician Assistant

## 2024-04-08 ENCOUNTER — Telehealth: Admitting: Physician Assistant

## 2024-04-08 VITALS — Ht 66.0 in | Wt 175.0 lb

## 2024-04-08 DIAGNOSIS — L659 Nonscarring hair loss, unspecified: Secondary | ICD-10-CM

## 2024-04-08 LAB — IBC + FERRITIN
Ferritin: 41 ng/mL (ref 10.0–291.0)
Iron: 64 ug/dL (ref 42–145)
Saturation Ratios: 20.3 % (ref 20.0–50.0)
TIBC: 315 ug/dL (ref 250.0–450.0)
Transferrin: 225 mg/dL (ref 212.0–360.0)

## 2024-04-08 LAB — CBC WITH DIFFERENTIAL/PLATELET
Basophils Absolute: 0 K/uL (ref 0.0–0.1)
Basophils Relative: 0.6 % (ref 0.0–3.0)
Eosinophils Absolute: 0.1 K/uL (ref 0.0–0.7)
Eosinophils Relative: 2 % (ref 0.0–5.0)
HCT: 39.7 % (ref 36.0–46.0)
Hemoglobin: 13.5 g/dL (ref 12.0–15.0)
Lymphocytes Relative: 24.1 % (ref 12.0–46.0)
Lymphs Abs: 1.6 K/uL (ref 0.7–4.0)
MCHC: 33.9 g/dL (ref 30.0–36.0)
MCV: 90.2 fl (ref 78.0–100.0)
Monocytes Absolute: 0.5 K/uL (ref 0.1–1.0)
Monocytes Relative: 7.1 % (ref 3.0–12.0)
Neutro Abs: 4.4 K/uL (ref 1.4–7.7)
Neutrophils Relative %: 66.2 % (ref 43.0–77.0)
Platelets: 272 K/uL (ref 150.0–400.0)
RBC: 4.41 Mil/uL (ref 3.87–5.11)
RDW: 12.5 % (ref 11.5–15.5)
WBC: 6.6 K/uL (ref 4.0–10.5)

## 2024-04-08 LAB — COMPREHENSIVE METABOLIC PANEL WITH GFR
ALT: 22 U/L (ref 0–35)
AST: 16 U/L (ref 0–37)
Albumin: 4.5 g/dL (ref 3.5–5.2)
Alkaline Phosphatase: 62 U/L (ref 39–117)
BUN: 12 mg/dL (ref 6–23)
CO2: 25 meq/L (ref 19–32)
Calcium: 8.8 mg/dL (ref 8.4–10.5)
Chloride: 106 meq/L (ref 96–112)
Creatinine, Ser: 0.78 mg/dL (ref 0.40–1.20)
GFR: 102.2 mL/min (ref >60.00)
Glucose, Bld: 67 mg/dL — ABNORMAL LOW (ref 70–99)
Potassium: 4.2 meq/L (ref 3.5–5.1)
Sodium: 138 meq/L (ref 135–145)
Total Bilirubin: 0.4 mg/dL (ref 0.2–1.2)
Total Protein: 6.8 g/dL (ref 6.0–8.3)

## 2024-04-08 LAB — TSH: TSH: 0.75 u[IU]/mL (ref 0.35–5.50)

## 2024-04-08 LAB — VITAMIN D 25 HYDROXY (VIT D DEFICIENCY, FRACTURES): VITD: 34.08 ng/mL (ref 30.00–100.00)

## 2024-04-08 NOTE — Progress Notes (Signed)
 I acted as a Neurosurgeon for [employer], [name] Arland Chute, LPN  Virtual Visit via Video Note   I, Lucie Buttner, PA, connected with  Morgan Chang  ([id number], Apr 23, 1996) on 04/08/24 at 10:20 AM EDT by a video-enabled telemedicine application and verified that I am speaking with the correct person using two identifiers.  Location: Patient: Home Provider: [place] office   I discussed the limitations of evaluation and management by telemedicine and the availability of in person appointments. The patient expressed understanding and agreed to proceed.    History of Present Illness: Morgan Chang is a 28 y.o. who identifies as a female who was assigned female at birth, and is being seen today for hair loss. She reports that she has noticed generalized hair thinning for quite some time, worsening over the past few months. Pt c/o hair loss since Oct. She thinks may be related to Vit def. She would like blood work checked. Reports there is no recent COVID. Has periods that last for two days, can be heavy though. Reports there is no iron or biotin supplements.  Problems:  Patient Active Problem List   Diagnosis Date Noted   Positive ANA (antinuclear antibody) 07/24/2023   Fluctuation of weight 07/24/2023   Arthralgia 07/24/2023   Bipolar 2 disorder, major depressive episode (HCC) 06/06/2023   Insomnia 06/06/2023   Suicidal ideation 06/06/2023   Marijuana dependence (HCC) 06/06/2023   Symptomatic mammary hypertrophy 09/06/2022   Panic attack as reaction to stress 04/08/2022   Panniculitis 12/13/2021   History of gestational hypertension 09/14/2021   Anemia 09/14/2021   Attention deficit hyperactivity disorder (ADHD), 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   Gestational diabetes mellitus (GDM), antepartum 04/14/2021   Generalized anxiety disorder 03/11/2020   Moderate episode of recurrent major depressive disorder (HCC) 03/11/2020   Sleep difficulties 03/11/2020   Mass of right  breast 08/13/2018   Pain of breast 08/13/2018   External hemorrhoids 07/31/2016   Encephalopathy 05/15/2015   GERD (gastroesophageal reflux disease) 12/10/2014    Allergies: No Known Allergies Medications:  Current Outpatient Medications:    clonazePAM  (KLONOPIN ) 0.5 MG tablet, Take 1 tablet (0.5 mg total) by mouth daily as needed for anxiety., Disp: 30 tablet, Rfl: 0   cloNIDine  (CATAPRES ) 0.1 MG tablet, Take 1 tablet (0.1 mg total) by mouth 3 (three) times daily as needed (anxiety)., Disp: 90 tablet, Rfl: 2   FLUoxetine  (PROZAC ) 10 MG capsule, Take 1 capsule (10 mg total) by mouth daily for 14 days, THEN 2 capsules (20 mg total) daily for 16 days., Disp: 46 capsule, Rfl: 1   lamoTRIgine  (LAMICTAL ) 200 MG tablet, Take 1 tablet (200 mg total) by mouth daily for 11 days., Disp: 30 tablet, Rfl: 0   ondansetron  (ZOFRAN -ODT) 4 MG disintegrating tablet, Take 1 tablet (4 mg total) by mouth every 8 (eight) hours as needed for nausea or vomiting., Disp: 20 tablet, Rfl: 0   zolpidem  (AMBIEN  CR) 12.5 MG CR tablet, Take 1 tablet (12.5 mg total) by mouth at bedtime as needed. for sleep, Disp: 30 tablet, Rfl: 0  Observations/Objective: Patient is well-developed, well-nourished in no acute distress.  Resting comfortably  at home.  Head is normocephalic, atraumatic.  No labored breathing.  Speech is clear and coherent with logical content.  Patient is alert and oriented at baseline.   Assessment and Plan: 1. Hair thinning (Primary) - CBC with Differential/Platelet; Future - Comprehensive metabolic panel with GFR; Future - TSH; Future - VITAMIN D  25 Hydroxy (Vit-D Deficiency, Fractures);  Future - IBC + Ferritin; Future  Will update blood work at her request for further evaluation of this issue Recommend continue efforts at healthy lifestyle and stress management as this can also play a role with hair   Follow Up Instructions: I discussed the assessment and treatment plan with the patient. The  patient was provided an opportunity to ask questions and all were answered. The patient agreed with the plan and demonstrated an understanding of the instructions.  A copy of instructions were sent to the patient via MyChart unless otherwise noted below.   The patient was advised to call back or seek an in-person evaluation if the symptoms worsen or if the condition fails to improve as anticipated.  Lucie Buttner, [name]

## 2024-04-15 ENCOUNTER — Other Ambulatory Visit: Payer: Self-pay | Admitting: Medical Genetics

## 2024-04-15 ENCOUNTER — Encounter: Payer: Self-pay | Admitting: Physician Assistant

## 2024-04-15 DIAGNOSIS — L659 Nonscarring hair loss, unspecified: Secondary | ICD-10-CM

## 2024-04-16 ENCOUNTER — Other Ambulatory Visit (HOSPITAL_COMMUNITY)
Admission: RE | Admit: 2024-04-16 | Discharge: 2024-04-16 | Disposition: A | Discharge status: Home / Self Care | Payer: Self-pay | Source: Ambulatory Visit | Attending: Medical Genetics | Admitting: Medical Genetics

## 2024-04-27 LAB — GENECONNECT MOLECULAR SCREEN: Genetic Analysis Overall Interpretation: NEGATIVE

## 2024-04-29 NOTE — Progress Notes (Deleted)
 BH MD/PA/NP OP Progress Note  04/29/2024 2:46 PM Morgan Chang  MRN:  [id number]  Visit Diagnosis:  No diagnosis found.  Assessment: Morgan Chang is a 28 y.o. female with a history of MDD, sleep difficulties and reported ADHD who presented to [facility] for initial evaluation on 12/02/2022.  During initial evaluation patient reported significant symptoms of anxiety including excessive worry control, difficulty relaxing, racing thoughts worse at night that affects sleep, restlessness, increased irritability, and fears about happening.  She also endorsed experiencing panic attacks which started in December 2023 and occur once every couple days.  During the episodes of panic patient reported symptoms of diaphoresis, increased restlessness, picking behaviors, and dissociations.  Patient also does note some depressive symptoms, that appear to be a bit better managed after starting the Trintellix .  She denied any SI or thoughts of self-harm along with any history of mania or psychosis. Patient met criteria for generalized anxiety disorder and panic attacks.   Over the course of treatment patient had an episode which was concerning for hypomania in July-August of 2024.  Patient had described increased irritability, mood lability, negative self thoughts, feelings of worthlessness, insomnia, and passive SI for a 4-day period. Patient had presented to [facility] who started Abilify  and patient noted some improvement in her symptoms following this. With this episode patient also meets criteria for Bipolar 2 disorder.  [name] presents for follow-up evaluation. Today, 04/29/24, patient reported that     moods been stable and sleep is well controlled.  Anxiety still been an issue with the constant baseline anxiety that gradually is worse throughout the day.  Symptoms are primarily mental during beginning the day though progressed to more physical whether the goes on.  She  has noticed that social situations can increase her anxiety.  Patient has found limited benefit from Trintellix .  We will taper off and start Prozac  today.  Risk and benefits of this transition were discussed.  Need to watch out for progression of hypomania was reviewed.  We will continue on the remainder of her current regimen and follow up in 6 weeks.  Plan: - Discontinue Trintellix  to 10 mg daily for 2 weeks before discontinuing  - Start Prozac  10 mg daily for 2 weeks before increasing to 20 mg daily - Continue Lamictal  200 mg daily - Continue clonidine  0.1 mg to TID prn for anxiety - Continue Klonopin  0.5 mg every day prn for anxiety - Change Ambien  to CR 12.5 mg QHS - CMP, CBC, TSH, Vit D, cortisol reviewed - Crisis resources reviewed - Therapy referral, going to start at [facility] psychology therapy and working through [employer] - Follow up in 2 months  Chief Complaint:  No chief complaint on file.  HPI: On presentation today Morgan Chang reports that     things are going pretty well for her. This is the longest that she has gone without going into a depressive episode.  She feels like she is stabilizing lamotrigine  and denies any adverse side effects.  Her insomnia has also improved with the Ambien  CR and she has been sleeping well at night.  A concern today is regards to her anxiety.  Patient reports that she is not noticing significant benefit from the Trintellix  and is experiencing the anxiety throughout the day.  Starts in the mornings a more mental feeling and then progresses later on to come more physical.  She describes feeling body aches, chest pressure, lump in her throat, and occasionally shortness of breath.  She does take the clonidine  3 times a day with good effect though can tell when the medicines no longer in her system.  Patient has not used the Klonopin  in over a month.  As has been limited benefit with the Trintellix  we did discuss alternative options.  Patient has tried several  SSRI/SNRI medications in the past.  Besides Lexapro, Zoloft , and Trintellix  all of these trials were over 7 years ago before patient's bipolar disorder was adequately managed.  Told that the medication failures from these were related to other untreated symptoms.  At being the case we will retrial of Prozac  today and reviewed the risk and benefits.  Did caution for signs of hypomania.  Past Psychiatric History: The patient has one prior psychiatric hospitalization to [facility] in September of 2024 due to worsening depression and SI. She denies any suicide attempts.  She has been connected with a couple therapists in the past, though is currently not seeing anyone after her last therapist left the practice in January.   She has tried mirtazapine , Wellbutrin , Prozac , Zoloft , Lexapro (headaches), Celexa , Trintellix , venlafaxine (orthostatic hypotension), BuSpar, propranolol  (helped with akathisia, Atarax  (sleepy), Seroquel  (hair loss), Zyprexa  (oral motor issues), Risperdal  (lactation), trazodone, Depakote, Adderall, Vyvanse , Ritalin , Klonopin , Xanax, Lunesta , Ambien   Patient currently taking Vyvanse , Trintellix , Ambien , and gabapentin .  She denies any substance use denies other than one alcoholic drink a week.   Past Medical History:  Past Medical History:  Diagnosis Date   Anemia 09/14/2021   Anxiety    Depression    Endometriosis    Fibroid    Frank breech presentation 10/29/2016   GERD (gastroesophageal reflux disease) 12/10/2014   Gestational diabetes    Migraines    Pregnancy induced hypertension    S/P cesarean section 10/29/2016    Past Surgical History:  Procedure Laterality Date   ADENOIDECTOMY  10/29/2007   BREAST REDUCTION SURGERY     BREAST SURGERY  08/2015   CESAREAN SECTION N/A 10/29/2016   Procedure: CESAREAN SECTION;  Surgeon: Ezzie Buba, MD;  Location: [facility];  Service: Obstetrics;  Laterality: N/A;   CESAREAN SECTION N/A 07/25/2021   Procedure: Repeat  CESAREAN SECTION;  Surgeon: Kandyce Sor, MD;  Location: [facility] LD ORS;  Service: Obstetrics;  Laterality: N/A;   Repeat C/S BTL   LAPAROSCOPY  12/10/2014   NISSEN FUNDOPLICATION     RHINOPLASTY  03/22/2010   deviated septum   TONSILLECTOMY  2017   TUBAL LIGATION  07/25/2021    Family History:  Family History  Problem Relation Age of Onset   Heart disease Mother    Hypertension Mother    Anxiety disorder Mother    Depression Mother    Alcohol abuse Mother    Arthritis Mother    Hypertension Father    Anxiety disorder Father    Depression Father    Heart attack Father    Heart failure Father    Testicular cancer Brother    Diabetes Maternal Grandmother    Heart attack Maternal Grandfather        x 2    Social History:  Social History   Socioeconomic History   Marital status: Married    Spouse name: Not on file   Number of children: Not on file   Years of education: Not on file   Highest education level: Not on file  Occupational History   Occupation: Homemaker  Tobacco Use   Smoking status: Never    Passive exposure: Never   Smokeless tobacco:  Never  Vaping Use   Vaping status: Never Used  Substance and Sexual Activity   Alcohol use: Yes    Comment: less than one drink per month   Drug use: No   Sexual activity: Yes    Birth control/protection: Surgical  Other Topics Concern   Not on file  Social History Narrative   Stay at home   2 children -- 16 year old son and 41 year old daughter (2024)   Married   Social Drivers of Corporate investment banker Strain: Not on file  Food Insecurity: No Food Insecurity (06/06/2023)   Hunger Vital Sign    Worried About Running Out of Food in the Last Year: Never true    Ran Out of Food in the Last Year: Never true  Transportation Needs: No Transportation Needs (06/06/2023)   PRAPARE - Administrator, Civil Service (Medical): No    Lack of Transportation (Non-Medical): No  Physical Activity: Not on file   Stress: Not on file  Social Connections: Unknown (04/04/2023)   Received from [facility]   Social Network    Social Network: Not on file    Allergies: No Known Allergies  Current Medications: Current Outpatient Medications  Medication Sig Dispense Refill   clonazePAM  (KLONOPIN ) 0.5 MG tablet Take 1 tablet (0.5 mg total) by mouth daily as needed for anxiety. 30 tablet 0   cloNIDine  (CATAPRES ) 0.1 MG tablet Take 1 tablet (0.1 mg total) by mouth 3 (three) times daily as needed (anxiety). 90 tablet 2   FLUoxetine  (PROZAC ) 10 MG capsule Take 1 capsule (10 mg total) by mouth daily for 14 days, THEN 2 capsules (20 mg total) daily for 16 days. 46 capsule 1   lamoTRIgine  (LAMICTAL ) 200 MG tablet Take 1 tablet (200 mg total) by mouth daily for 11 days. 30 tablet 0   ondansetron  (ZOFRAN -ODT) 4 MG disintegrating tablet Take 1 tablet (4 mg total) by mouth every 8 (eight) hours as needed for nausea or vomiting. 20 tablet 0   zolpidem  (AMBIEN  CR) 12.5 MG CR tablet Take 1 tablet (12.5 mg total) by mouth at bedtime as needed. for sleep 30 tablet 0   No current facility-administered medications for this visit.     Psychiatric Specialty Exam: Review of Systems  Last menstrual period 04/04/2024.There is no height or weight on file to calculate BMI.  General Appearance: Fairly Groomed  Eye Contact:  Good  Speech:  Clear and Coherent and Pressured  Volume:  Normal  Mood:  Euthymic  Affect:  Congruent  Thought Process:  Coherent and Goal Directed  Orientation:  Full (Time, Place, and Person)  Thought Content: Logical   Suicidal Thoughts:  No  Homicidal Thoughts:  No  Memory:  Immediate;   Good  Judgement:  Good  Insight:  Fair  Psychomotor Activity:  Normal  Concentration:  Concentration: Good  Recall:  Good  Fund of Knowledge: Fair  Language: Good  Akathisia:  NA    AIMS (if indicated): not done  Assets:  Communication Skills Desire for  Improvement Housing Talents/Skills Transportation  ADL's:  Intact  Cognition: WNL  Sleep:  Fair   Metabolic Disorder Labs: Lab Results  Component Value Date   HGBA1C 5.3 10/05/2023   No results found for: PROLACTIN Lab Results  Component Value Date   CHOL 167 04/20/2023   TRIG 99.0 04/20/2023   HDL 56.30 04/20/2023   CHOLHDL 3 04/20/2023   VLDL 19.8 04/20/2023   LDLCALC 91 04/20/2023  Lab Results  Component Value Date   TSH 0.75 04/08/2024   TSH 0.46 10/05/2023    Therapeutic Level Labs: No results found for: LITHIUM No results found for: VALPROATE No results found for: CBMZ   Screenings: AUDIT    Flowsheet Row Admission (Discharged) from 06/06/2023 in [facility]IENT BEHAVIORAL MEDICINE  Alcohol Use Disorder Identification Test Final Score (AUDIT) 0   GAD-7    Flowsheet Row Office Visit from 10/05/2023 in [facility] [facility] [employer] Visit from 04/20/2023 in [facility] [place] Video Visit from 12/01/2022 in [facility] Video Visit from 09/30/2022 in [facility] [place] Office Visit from 08/25/2022 in [facility] [place] [facility] [place]  Total GAD-7 Score 21 21 20 3 21    PHQ2-9    Flowsheet Row Office Visit from 10/05/2023 in [facility] [place] Office Visit from 04/20/2023 in [facility] [place] Video Visit from 12/01/2022 in [facility] Office Visit from 08/25/2022 in [facility] [place] Office Visit from 12/28/2021 in [facility] [place]  PHQ-2 Total Score 1 3 1 6  0  PHQ-9 Total Score 9 15 11 18 6    Flowsheet Row UC from 01/22/2024 in [facility]) UC from 11/22/2023 in [facility] at [facility])  Admission (Discharged) from 06/06/2023 in [facility] INPATIENT BE[facility]AVIORAL MEDICINE  C-SSRS RISK CATEGORY No Risk No Risk Low Risk    Collaboration of Care: Collaboration of Care: Medication Management AEB medication prescription  Patient/Guardian was advised Release of Information must be obtained prior to any record release in order to collaborate their care with an outside provider. Patient/Guardian was advised if they have not already done so to contact the registration department to sign all necessary forms in order for us  to release information regarding their care.   Consent: Patient/Guardian gives verbal consent for treatment and assignment of benefits for services provided during this visit. Patient/Guardian expressed understanding and agreed to proceed.    Arvella CHRISTELLA Finder, MD 04/29/2024, 2:46 PM   Virtual Visit via Video Note  I connected with Ayahna Magro on 04/29/24 at 10:30 AM EDT by a video enabled telemedicine application and verified that I am speaking with the correct person using two identifiers.  Location: Patient: Home Provider: Home Office   I discussed the limitations of evaluation and management by telemedicine and the availability of in person appointments. The patient expressed understanding and agreed to proceed.   I discussed the assessment and treatment plan with the patient. The patient was provided an opportunity to ask questions and all were answered. The patient agreed with the plan and demonstrated an understanding of the instructions.   The patient was advised to call back or seek an in-person evaluation if the symptoms worsen or if the condition fails to improve as anticipated.  I provided 25 minutes of non-face-to-face time during this encounter.   Arvella CHRISTELLA Finder, MD

## 2024-04-30 ENCOUNTER — Other Ambulatory Visit (HOSPITAL_COMMUNITY): Payer: Self-pay | Admitting: Psychiatry

## 2024-04-30 DIAGNOSIS — F3181 Bipolar II disorder: Secondary | ICD-10-CM

## 2024-04-30 DIAGNOSIS — F411 Generalized anxiety disorder: Secondary | ICD-10-CM

## 2024-05-01 ENCOUNTER — Encounter (HOSPITAL_COMMUNITY): Payer: Self-pay

## 2024-05-01 DIAGNOSIS — F3181 Bipolar II disorder: Secondary | ICD-10-CM

## 2024-05-01 DIAGNOSIS — F411 Generalized anxiety disorder: Secondary | ICD-10-CM

## 2024-05-01 MED ORDER — ZOLPIDEM TARTRATE ER 12.5 MG PO TBCR: 12.5000 mg | EXTENDED_RELEASE_TABLET | Freq: Every evening | ORAL | 0 refills | Status: DC | PRN

## 2024-05-03 ENCOUNTER — Telehealth (HOSPITAL_COMMUNITY): Admitting: Psychiatry

## 2024-05-06 NOTE — Progress Notes (Unsigned)
 BH MD/PA/NP OP Progress Note  05/09/2024 2:17 PM Morgan Chang  MRN:  [id number]  Visit Diagnosis:    ICD-10-CM   1. Generalized anxiety disorder  F41.1 FLUoxetine  (PROZAC ) 10 MG capsule    zolpidem  (AMBIEN  CR) 12.5 MG CR tablet    2. Bipolar 2 disorder (HCC)  F31.81 lamoTRIgine  (LAMICTAL ) 200 MG tablet    zolpidem  (AMBIEN  CR) 12.5 MG CR tablet    3. Panic attack as reaction to stress  F41.0 FLUoxetine  (PROZAC ) 10 MG capsule   F43.0     4. History of ADHD  Z86.59 atomoxetine  (STRATTERA ) 10 MG capsule      Assessment: Morgan Chang is a 28 y.o. female with a history of MDD, sleep difficulties and reported ADHD who presented to [facility] for initial evaluation on 12/02/2022.  During initial evaluation patient reported significant symptoms of anxiety including excessive worry control, difficulty relaxing, racing thoughts worse at night that affects sleep, restlessness, increased irritability, and fears about happening.  She also endorsed experiencing panic attacks which started in December 2023 and occur once every couple days.  During the episodes of panic patient reported symptoms of diaphoresis, increased restlessness, picking behaviors, and dissociations.  Patient also does note some depressive symptoms, that appear to be a bit better managed after starting the Trintellix .  She denied any SI or thoughts of self-harm along with any history of mania or psychosis. Patient met criteria for generalized anxiety disorder and panic attacks.   Over the course of treatment patient had an episode which was concerning for hypomania in July-August of 2024.  Patient had described increased irritability, mood lability, negative self thoughts, feelings of worthlessness, insomnia, and passive SI for a 4-day period. Patient had presented to [facility] who started Abilify  and patient noted some improvement in her symptoms following this. With this episode patient also meets  criteria for Bipolar 2 disorder.  Morgan Chang presents for follow-up evaluation. Today, 05/09/24, patient reported that mood has remained stable and anxiety has been better controlled in the interim.  She tolerated the initiation of Prozac  well though developed excessive diaphoresis on the 20 mg dose.  She self taper back to 10 mg 2 weeks ago.  Diaphoresis has improved since.  Insomnia remains well controlled on the Ambien .  Patient does endorse concerns around concentration and focus.  After reviewing risk and benefits we opted to start her on atomoxetine  10 mg to manage symptoms.  Patient will closely monitor for any change in anxiety or insomnia.  Plan: - Continue Prozac  10 mg daily  - Continue Lamictal  200 mg daily - Start Atomoxetine  10 mg daily - Continue clonidine  0.1 mg to TID prn for anxiety - Continue Klonopin  0.5 mg every day prn for anxiety (has not used in 6 weeks) - Continue Ambien  to CR 12.5 mg QHS - CMP, CBC, TSH, Vit D, cortisol reviewed - Crisis resources reviewed - Therapy referral, going to start at [facility] psychology therapy and working through [employer] - Follow up in a month  Chief Complaint:  Chief Complaint  Patient presents with   Follow-up   HPI: On presentation today Morgan Chang reports that things have been going really well for her.  Since switching to the Prozac  she has felt that her anxiety has been much better control.  She did increase the dose to the 20 mg as instructed though had been noticing an increase in sweating both during the day and at night.  2 weeks ago she decrease back  down to the 10 mg dose which still adequately controls her anxiety symptoms and decreased the sweating symptom.  It is likely that the excessive sweating was secondary to the Prozac  though should also note that temperatures were particularly high during this period and patient works outdoors.  On the Prozac  patient has not needed to use the as needed clonidine  as consistently.  She is  using it around once a day currently compared to the 3 times a day in the past.  She has not needed to use the Klonopin  in 6 weeks now.  Insomnia has been well controlled on the Ambien .  Did review patient's message in the interim about an early refill.  She explained that she had left her pill bottle in [place]  while on vacation.  She was understanding that it was not possible to fill the medication earlier.  Patient's only concern today is the ADHD side of things.  She has difficulty getting herself organized and completing task timely manner.  She also frequently jumps from task to task.  She had been better controlled on Vyvanse  in the past though this likely did contribute to the hypomanic episode she experienced at that time.  Given this we would hold off on stimulant medications.  After discussion agreed to trial of low-dose of atomoxetine .  Risks and benefits were reviewed with plan made to gradually taper up assuming no adverse side effects or changes in sleep.  Past Psychiatric History: The patient has one prior psychiatric hospitalization to [facility] in September of 2024 due to worsening depression and SI. She denies any suicide attempts.  She has been connected with a couple therapists in the past, though is currently not seeing anyone after her last therapist left the practice in January.   She has tried mirtazapine , Wellbutrin , Prozac , Zoloft , Lexapro (headaches), Celexa , Trintellix  (loss of benefit over time), venlafaxine (orthostatic hypotension), BuSpar, propranolol  (helped with akathisia, Atarax  (sleepy), Seroquel  (hair loss), Zyprexa  (oral motor issues), Risperdal  (lactation), trazodone, Depakote, Adderall, Vyvanse , Ritalin , Klonopin , Xanax, Lunesta , Ambien   She denies any substance use denies other than one alcoholic drink a week.   Past Medical History:  Past Medical History:  Diagnosis Date   Anemia 09/14/2021   Anxiety    Depression    Endometriosis    Fibroid    Frank breech  presentation 10/29/2016   GERD (gastroesophageal reflux disease) 12/10/2014   Gestational diabetes    Migraines    Pregnancy induced hypertension    S/P cesarean section 10/29/2016    Past Surgical History:  Procedure Laterality Date   ADENOIDECTOMY  10/29/2007   BREAST REDUCTION SURGERY     BREAST SURGERY  08/2015   CESAREAN SECTION N/A 10/29/2016   Procedure: CESAREAN SECTION;  Surgeon: Ezzie Buba, MD;  [facility];  Service: Obstetrics;  Laterality: N/A;   CESAREAN SECTION N/A 07/25/2021   Procedure: Repeat CESAREAN SECTION;  Surgeon: Kandyce Sor, MD;  Location: [facility];  Service: Obstetrics;  Laterality: N/A;   Repeat C/S BTL   LAPAROSCOPY  12/10/2014   NISSEN FUNDOPLICATION     RHINOPLASTY  03/22/2010   deviated septum   TONSILLECTOMY  2017   TUBAL LIGATION  07/25/2021    Family History:  Family History  Problem Relation Age of Onset   Heart disease Mother    Hypertension Mother    Anxiety disorder Mother    Depression Mother    Alcohol abuse Mother    Arthritis Mother    Hypertension Father  Anxiety disorder Father    Depression Father    Heart attack Father    Heart failure Father    Testicular cancer Brother    Diabetes Maternal Grandmother    Heart attack Maternal Grandfather        x 2    Social History:  Social History   Socioeconomic History   Marital status: Married    Spouse name: Not on file   Number of children: Not on file   Years of education: Not on file   Highest education level: Not on file  Occupational History   Occupation: Homemaker  Tobacco Use   Smoking status: Never    Passive exposure: Never   Smokeless tobacco: Never  Vaping Use   Vaping status: Never Used  Substance and Sexual Activity   Alcohol use: Yes    Comment: less than one drink per month   Drug use: No   Sexual activity: Yes    Birth control/protection: Surgical  Other Topics Concern   Not on file  Social History Narrative    Stay at home   2 children -- 34 year old son and 80 year old daughter (2024)   Married   Social Drivers of Corporate investment banker Strain: Not on file  Food Insecurity: No Food Insecurity (06/06/2023)   Hunger Vital Sign    Worried About Running Out of Food in the Last Year: Never true    Ran Out of Food in the Last Year: Never true  Transportation Needs: No Transportation Needs (06/06/2023)   PRAPARE - Administrator, Civil Service (Medical): No    Lack of Transportation (Non-Medical): No  Physical Activity: Not on file  Stress: Not on file  Social Connections: Unknown (04/04/2023)   Received from [facility]   Social Network    Social Network: Not on file    Allergies: No Known Allergies  Current Medications: Current Outpatient Medications  Medication Sig Dispense Refill   atomoxetine  (STRATTERA ) 10 MG capsule Take 1 capsule (10 mg total) by mouth daily. 30 capsule 2   clonazePAM  (KLONOPIN ) 0.5 MG tablet Take 1 tablet (0.5 mg total) by mouth daily as needed for anxiety. 30 tablet 0   cloNIDine  (CATAPRES ) 0.1 MG tablet Take 1 tablet (0.1 mg total) by mouth 3 (three) times daily as needed (anxiety). 90 tablet 2   FLUoxetine  (PROZAC ) 10 MG capsule Take 1 capsule (10 mg total) by mouth daily. 90 capsule 0   lamoTRIgine  (LAMICTAL ) 200 MG tablet Take 1 tablet (200 mg total) by mouth daily for 11 days. 90 tablet 0   ondansetron  (ZOFRAN -ODT) 4 MG disintegrating tablet Take 1 tablet (4 mg total) by mouth every 8 (eight) hours as needed for nausea or vomiting. 20 tablet 0   [START ON 05/31/2024] zolpidem  (AMBIEN  CR) 12.5 MG CR tablet Take 1 tablet (12.5 mg total) by mouth at bedtime as needed. for sleep 30 tablet 0   No current facility-administered medications for this visit.     Psychiatric Specialty Exam: Review of Systems  There were no vitals taken for this visit.There is no height or weight on file to calculate BMI.  General Appearance: Fairly Groomed  Eye  Contact:  Good  Speech:  Clear and Coherent and Pressured  Volume:  Normal  Mood:  Euthymic  Affect:  Congruent  Thought Process:  Coherent and Goal Directed  Orientation:  Full (Time, Place, and Person)  Thought Content: Logical   Suicidal Thoughts:  No  Homicidal Thoughts:  No  Memory:  Immediate;   Good  Judgement:  Good  Insight:  Fair  Psychomotor Activity:  Normal  Concentration:  Concentration: Good  Recall:  Good  Fund of Knowledge: Fair  Language: Good  Akathisia:  NA    AIMS (if indicated): not done  Assets:  Communication Skills Desire for Improvement Housing Talents/Skills Transportation  ADL's:  Intact  Cognition: WNL  Sleep:  Good   Metabolic Disorder Labs: Lab Results  Component Value Date   HGBA1C 5.3 10/05/2023   No results found for: PROLACTIN Lab Results  Component Value Date   CHOL 167 04/20/2023   TRIG 99.0 04/20/2023   HDL 56.30 04/20/2023   CHOLHDL 3 04/20/2023   VLDL 19.8 04/20/2023   LDLCALC 91 04/20/2023   Lab Results  Component Value Date   TSH 0.75 04/08/2024   TSH 0.46 10/05/2023    Therapeutic Level Labs: No results found for: LITHIUM No results found for: VALPROATE No results found for: CBMZ   Screenings: AUDIT    Flowsheet Row Admission (Discharged) from 06/06/2023 in [facility]  Alcohol Use Disorder Identification Test Final Score (AUDIT) 0   GAD-7    Flowsheet Row Office Visit from 10/05/2023 in [facility] Visit from 04/20/2023 in [facility] Video Visit from 12/01/2022 in [facility] Video Visit from 09/30/2022 in [facility] [employer] Visit from 08/25/2022 in [facility]  Total GAD-7 Score 21 21 20 3 21    PHQ2-9    Flowsheet Row Office Visit from 10/05/2023 in [facility] [employer] Visit from 04/20/2023 in [facility] [place] Video Visit from 12/01/2022 in [facility] Office Visit from 08/25/2022 in [facility] [employer] [facility] Visit from 12/28/2021 in [facility] [place]  PHQ-2 Total Score 1 3 1 6  0  PHQ-9 Total Score 9 15 11 18 6    Flowsheet Row UC from 01/22/2024 in [facility]) UC from 11/22/2023 in [facility]) Admission (Discharged) from 06/06/2023 in [facility]  C-SSRS RISK CATEGORY No Risk No Risk Low Risk    Collaboration of Care: Collaboration of Care: Medication Management AEB medication prescription  Patient/Guardian was advised Release of Information must be obtained prior to any record release in order to collaborate their care with an outside provider. Patient/Guardian was advised if they have not already done so to contact the registration department to sign all necessary forms in order for us  to release information regarding their care.   Consent: Patient/Guardian gives verbal consent for treatment and assignment of benefits for services provided during this visit. Patient/Guardian expressed understanding and agreed to proceed.    Arvella CHRISTELLA Finder, MD 05/09/2024, 2:17 PM   Virtual Visit via Video Note  I connected with Morgan Chang on 05/09/24 at  2:00 PM EDT by a video enabled telemedicine application and verified that I am speaking with the correct person using two identifiers.  Location: Patient: Home Provider: Home Office   I discussed the limitations of evaluation and management by telemedicine and the availability of in person appointments. The patient expressed understanding and agreed to proceed.   I discussed the assessment  and treatment plan with the patient. The patient was provided an  opportunity to ask questions and all were answered. The patient agreed with the plan and demonstrated an understanding of the instructions.   The patient was advised to call back or seek an in-person evaluation if the symptoms worsen or if the condition fails to improve as anticipated.  I provided 25 minutes of non-face-to-face time during this encounter.   Arvella CHRISTELLA Finder, MD

## 2024-05-09 ENCOUNTER — Encounter (HOSPITAL_COMMUNITY): Payer: Self-pay | Admitting: Psychiatry

## 2024-05-09 ENCOUNTER — Telehealth (HOSPITAL_BASED_OUTPATIENT_CLINIC_OR_DEPARTMENT_OTHER): Admitting: Psychiatry

## 2024-05-09 DIAGNOSIS — F430 Acute stress reaction: Secondary | ICD-10-CM | POA: Diagnosis not present

## 2024-05-09 DIAGNOSIS — F410 Panic disorder [episodic paroxysmal anxiety] without agoraphobia: Secondary | ICD-10-CM

## 2024-05-09 DIAGNOSIS — F3181 Bipolar II disorder: Secondary | ICD-10-CM

## 2024-05-09 DIAGNOSIS — F411 Generalized anxiety disorder: Secondary | ICD-10-CM | POA: Diagnosis not present

## 2024-05-09 DIAGNOSIS — Z8659 Personal history of other mental and behavioral disorders: Secondary | ICD-10-CM

## 2024-05-09 MED ORDER — ATOMOXETINE HCL 10 MG PO CAPS: 10.0000 mg | ORAL_CAPSULE | Freq: Every day | ORAL | 2 refills | Status: DC

## 2024-05-09 MED ORDER — ZOLPIDEM TARTRATE ER 12.5 MG PO TBCR
12.5000 mg | EXTENDED_RELEASE_TABLET | Freq: Every evening | ORAL | 0 refills | Status: DC | PRN
Start: 2024-05-31 — End: 2024-06-13

## 2024-05-09 MED ORDER — LAMOTRIGINE 200 MG PO TABS: 200.0000 mg | ORAL_TABLET | Freq: Every day | ORAL | 0 refills | Status: DC

## 2024-05-09 MED ORDER — FLUOXETINE HCL 10 MG PO CAPS: 10.0000 mg | ORAL_CAPSULE | Freq: Every day | ORAL | 0 refills | Status: DC

## 2024-05-10 ENCOUNTER — Telehealth (HOSPITAL_COMMUNITY): Payer: Self-pay | Admitting: *Deleted

## 2024-05-10 ENCOUNTER — Encounter (HOSPITAL_COMMUNITY): Payer: Self-pay

## 2024-05-10 NOTE — Telephone Encounter (Signed)
 Opened in error

## 2024-06-04 ENCOUNTER — Other Ambulatory Visit (HOSPITAL_COMMUNITY): Payer: Self-pay | Admitting: Psychiatry

## 2024-06-04 DIAGNOSIS — F411 Generalized anxiety disorder: Secondary | ICD-10-CM

## 2024-06-04 DIAGNOSIS — F410 Panic disorder [episodic paroxysmal anxiety] without agoraphobia: Secondary | ICD-10-CM

## 2024-06-10 ENCOUNTER — Ambulatory Visit (INDEPENDENT_AMBULATORY_CARE_PROVIDER_SITE_OTHER): Admitting: Physician Assistant

## 2024-06-10 ENCOUNTER — Encounter: Payer: Self-pay | Admitting: Physician Assistant

## 2024-06-10 VITALS — BP 100/70 | HR 54 | Temp 97.5°F | Ht 66.0 in | Wt 166.5 lb

## 2024-06-10 DIAGNOSIS — R1013 Epigastric pain: Secondary | ICD-10-CM

## 2024-06-10 DIAGNOSIS — Z23 Encounter for immunization: Secondary | ICD-10-CM

## 2024-06-10 MED ORDER — PANTOPRAZOLE SODIUM 40 MG PO TBEC: 40.0000 mg | DELAYED_RELEASE_TABLET | Freq: Every day | ORAL | 0 refills | Status: AC

## 2024-06-10 MED ORDER — SUCRALFATE 1 G PO TABS
1.0000 g | ORAL_TABLET | Freq: Three times a day (TID) | ORAL | 0 refills | Status: AC
Start: 2024-06-10 — End: 2024-06-17

## 2024-06-10 NOTE — Progress Notes (Signed)
 Morgan Chang is a 28 y.o. female here for a follow up of a pre-existing problem.  History of Present Illness:   Chief Complaint  Patient presents with   Abdominal Pain    Pt c/o left side abdominal pain x 1 month, worse after eating. Denies constipation.    Discussed the use of AI scribe software for clinical note transcription with the patient, who gave verbal consent to proceed.  History of Present Illness Morgan Chang is a 28 year old female who presents with gnawing abdominal pain exacerbated by eating.  She experiences a gnawing, heavy pain in the left upper abdomen above the umbilicus, worsening with food intake, even with small amounts. This pain has persisted for a month, initially improving but worsening over the past two days.  She has a history of E. coli and H. pylori infections, with the latter diagnosed a year ago and treated with antibiotics. The current pain is more severe than previous episodes.  She has been taking Pepcid  daily for five days without improvement and has previously used Protonix . There are no recent changes in alcohol consumption or use of ibuprofen . No constipation, nausea, melena, hematochezia, weight loss, fatigue, urinary symptoms, or unusual vaginal discharge. No concerns for pregnancy.    Past Medical History:  Diagnosis Date   Anemia 09/14/2021   Anxiety    Depression    Endometriosis    Fibroid    Frank breech presentation 10/29/2016   GERD (gastroesophageal reflux disease) 12/10/2014   Gestational diabetes    Migraines    Pregnancy induced hypertension    S/P cesarean section 10/29/2016     Social History   Tobacco Use   Smoking status: Never    Passive exposure: Never   Smokeless tobacco: Never  Vaping Use   Vaping status: Never Used  Substance Use Topics   Alcohol use: Yes    Comment: less than one drink per month   Drug use: No    Past Surgical History:  Procedure Laterality Date   ADENOIDECTOMY  10/29/2007    BREAST REDUCTION SURGERY     BREAST SURGERY  08/2015   CESAREAN SECTION N/A 10/29/2016   Procedure: CESAREAN SECTION;  Surgeon: Ezzie Buba, MD;  [facility]: [facility];  Service: Obstetrics;  Laterality: N/A;   CESAREAN SECTION N/A 07/25/2021   Procedure: Repeat CESAREAN SECTION;  Surgeon: Kandyce Sor, MD;  Location: [facility];  Service: Obstetrics;  Laterality: N/A;   Repeat C/S BTL   LAPAROSCOPY  12/10/2014   NISSEN FUNDOPLICATION     RHINOPLASTY  03/22/2010   deviated septum   TONSILLECTOMY  2017   TUBAL LIGATION  07/25/2021    Family History  Problem Relation Age of Onset   Heart disease Mother    Hypertension Mother    Anxiety disorder Mother    Depression Mother    Alcohol abuse Mother    Arthritis Mother    Hypertension Father    Anxiety disorder Father    Depression Father    Heart attack Father    Heart failure Father    Testicular cancer Brother    Diabetes Maternal Grandmother    Heart attack Maternal Grandfather        x 2    No Known Allergies  Current Medications:   Current Outpatient Medications:    clonazePAM  (KLONOPIN ) 0.5 MG tablet, Take 1 tablet (0.5 mg total) by mouth daily as needed for anxiety., Disp: 30 tablet, Rfl: 0   cloNIDine  (CATAPRES ) 0.1 MG  tablet, Take 1 tablet (0.1 mg total) by mouth 3 (three) times daily as needed (anxiety)., Disp: 90 tablet, Rfl: 2   famotidine  (PEPCID ) 10 MG tablet, Take 10 mg by mouth daily., Disp: , Rfl:    FLUoxetine  (PROZAC ) 10 MG capsule, Take 1 capsule (10 mg total) by mouth daily., Disp: 90 capsule, Rfl: 0   lamoTRIgine  (LAMICTAL ) 200 MG tablet, Take 1 tablet (200 mg total) by mouth daily for 11 days., Disp: 90 tablet, Rfl: 0   zolpidem  (AMBIEN  CR) 12.5 MG CR tablet, Take 1 tablet (12.5 mg total) by mouth at bedtime as needed. for sleep, Disp: 30 tablet, Rfl: 0   Review of Systems:   Negative unless otherwise specified per HPI.  Vitals:   Vitals:   06/10/24 1328  BP: 100/70  Pulse: (!)  54  Temp: (!) 97.5 F (36.4 C)  TempSrc: Temporal  SpO2: 99%  Weight: 166 lb 8 oz (75.5 kg)  Height: 5' 6 (1.676 m)     Body mass index is 26.87 kg/m.  Physical Exam:   Physical Exam Vitals and nursing note reviewed.  Constitutional:      General: She is not in acute distress.    Appearance: She is well-developed. She is not ill-appearing or toxic-appearing.  Cardiovascular:     Rate and Rhythm: Normal rate and regular rhythm.     Pulses: Normal pulses.     Heart sounds: Normal heart sounds, S1 normal and S2 normal.  Pulmonary:     Effort: Pulmonary effort is normal.     Breath sounds: Normal breath sounds.  Abdominal:     General: Abdomen is flat. Bowel sounds are normal.     Palpations: Abdomen is soft.     Tenderness: There is generalized abdominal tenderness and tenderness in the epigastric area. There is no right CVA tenderness or left CVA tenderness.  Skin:    General: Skin is warm and dry.  Neurological:     Mental Status: She is alert.     GCS: GCS eye subscore is 4. GCS verbal subscore is 5. GCS motor subscore is 6.  Psychiatric:        Speech: Speech normal.        Behavior: Behavior normal. Behavior is cooperative.     Assessment and Plan:   Assessment and Plan Assessment & Plan Epigastric pain  Severe epigastric pain likely due to gastritis or peptic ulcer disease, possibly related to H. pylori. - Prescribed pantoprazole  for acid reduction. - Prescribed sucralfate  to heal possible erosions. - Consider abdominal imaging or gastroenterology referral if no improvement in a week. - Advised to report lack of improvement.  Consider H Pylori testing vs CT abdomen/pelvis vs gastroenterology referral based on symptom(s)   Lucie Buttner, [name]

## 2024-06-10 NOTE — Progress Notes (Unsigned)
 BH MD/PA/NP OP Progress Note  06/13/2024 4:20 PM Morgan Chang  MRN:  [id number]  Visit Diagnosis:    ICD-10-CM   1. Generalized anxiety disorder  F41.1 cloNIDine  (CATAPRES ) 0.1 MG tablet    zolpidem  (AMBIEN  CR) 12.5 MG CR tablet    2. Panic attack as reaction to stress  F41.0 cloNIDine  (CATAPRES ) 0.1 MG tablet   F43.0     3. Bipolar 2 disorder (HCC)  F31.81 zolpidem  (AMBIEN  CR) 12.5 MG CR tablet    lamoTRIgine  (LAMICTAL ) 200 MG tablet       Assessment: Morgan Chang is a 28 y.o. female with a history of MDD, sleep difficulties and reported ADHD who presented to [facility] at [facility] for initial evaluation on 12/02/2022.  During initial evaluation patient reported significant symptoms of anxiety including excessive worry control, difficulty relaxing, racing thoughts worse at night that affects sleep, restlessness, increased irritability, and fears about happening.  She also endorsed experiencing panic attacks which started in December 2023 and occur once every couple days.  During the episodes of panic patient reported symptoms of diaphoresis, increased restlessness, picking behaviors, and dissociations.  Patient also does note some depressive symptoms, that appear to be a bit better managed after starting the Trintellix .  She denied any SI or thoughts of self-harm along with any history of mania or psychosis. Patient met criteria for generalized anxiety disorder and panic attacks.   Over the course of treatment patient had an episode which was concerning for hypomania in July-August of 2024.  Patient had described increased irritability, mood lability, negative self thoughts, feelings of worthlessness, insomnia, and passive SI for a 4-day period. Patient had presented to [facility] who started Abilify  and patient noted some improvement in her symptoms following this. With this episode patient also meets criteria for Bipolar 2 disorder.  Morgan Chang presents for  follow-up evaluation. Today, 06/13/24, patient reported that anxiety and mood are well-controlled and she has decreased her as needed clonidine  use to 2 times a day.  She has not needed to use the Klonopin  in the interim.  Patient did self discontinued atomoxetine  due to lack of benefit.  Given slower titration due to concern of triggering anxiety or hypomania, the limited benefit is not atypical.  Regardless patient has had some improvement in concentration since switching to a consistent routine.  Given that would be appropriate to hold atomoxetine .  Will continue remainder of current regimen and follow-up in 3 months.  Plan: - Continue Prozac  10 mg daily  - Continue Lamictal  200 mg daily - Discontinue Atomoxetine  10 mg daily - Continue clonidine  0.1 mg to TID prn for anxiety - Continue Klonopin  0.5 mg every day prn for anxiety (has not used in 6 weeks) - Continue Ambien  to CR 12.5 mg QHS - CMP, CBC, TSH, Vit D, cortisol reviewed - Crisis resources reviewed - Therapy referral, going to start at [facility] psychology therapy and working through [employer] - Follow up in 3 months  Chief Complaint:  Chief Complaint  Patient presents with   Follow-up   HPI: On presentation today Morgan Chang reports that things have gone quite well for her in the interim.  Her anxiety and depression have been well-controlled with no flareups.  She also has an improvement in her psychosocial stressors.  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  She had been getting her degree in early education but did not finish her bachelor's.  She had initially worked in this field previously she feels much more comfortable now compared to past.  Furthermore they are going to help finance her school and she is going to finish her bachelor's degree.  The attention is still all over the  place but she has found the added structure from a consistent schedule to be helpful.  She discontinued atomoxetine  and she does not find it to be particularly helpful.  We reviewed that the dose she is on is unlikely to be significantly beneficial and the plan was to slowly titrate.  The lower starting dose was 2 manage concern of triggering anxiety or hypomania given patient's anxiety/bipolar disorder.  Regardless given current improvement it is appropriate to hold off on the atomoxetine .  Sleep has also been good.  Patient has noticed that if she uses Ambien  consistently it becomes less effective after 30 days.  She started to hold it a few days a month and has noticed this significantly improves its effect when she restarts it.  She has not needed to use the Klonopin  in the interim and has decreased the clonidine  use to 2 times a day.  Past Psychiatric History: The patient has one prior psychiatric hospitalization to [facility] in September of 2024 due to worsening depression and SI. She denies any suicide attempts.  She has been connected with a couple therapists in the past, though is currently not seeing anyone after her last therapist left the practice in January.   She has tried mirtazapine , Wellbutrin , Prozac , Zoloft , Lexapro (headaches), Celexa , Trintellix  (loss of benefit over time), venlafaxine (orthostatic hypotension), BuSpar, propranolol  (helped with akathisia, Atarax  (sleepy), Seroquel  (hair loss), Zyprexa  (oral motor issues), Risperdal  (lactation), trazodone, Depakote, Adderall, Vyvanse , Ritalin , atomoxetine  (10 mg dose ineffective did not titrate higher), Klonopin , Xanax, Lunesta , Ambien   She denies any substance use denies other than one alcoholic drink a week.   Past Medical History:  Past Medical History:  Diagnosis Date   Anemia 09/14/2021   Anxiety    Depression    Endometriosis    Fibroid    Frank breech presentation 10/29/2016   GERD (gastroesophageal reflux disease) 12/10/2014    Gestational diabetes    Migraines    Pregnancy induced hypertension    S/P cesarean section 10/29/2016    Past Surgical History:  Procedure Laterality Date   ADENOIDECTOMY  10/29/2007   BREAST REDUCTION SURGERY     BREAST SURGERY  08/2015   CESAREAN SECTION N/A 10/29/2016   Procedure: CESAREAN SECTION;  Surgeon: Ezzie Buba, MD;  Location: [facility];  Service: Obstetrics;  Laterality: N/A;   CESAREAN SECTION N/A 07/25/2021   Procedure: Repeat CESAREAN SECTION;  Surgeon: Kandyce Sor, MD;  Location: [facility] LD ORS;  Service: Obstetrics;  Laterality: N/A;   Repeat C/S BTL   LAPAROSCOPY  12/10/2014   NISSEN FUNDOPLICATION     RHINOPLASTY  03/22/2010   deviated septum   TONSILLECTOMY  2017   TUBAL LIGATION  07/25/2021    Family History:  Family History  Problem Relation Age of Onset   Heart disease Mother    Hypertension Mother    Anxiety disorder Mother    Depression Mother    Alcohol abuse Mother    Arthritis Mother    Hypertension Father    Anxiety disorder Father    Depression Father    Heart attack Father    Heart failure Father  Testicular cancer Brother    Diabetes Maternal Grandmother    Heart attack Maternal Grandfather        x 2    Social History:  Social History   Socioeconomic History   Marital status: Married    Spouse name: Not on file   Number of children: Not on file   Years of education: Not on file   Highest education level: Not on file  Occupational History   Occupation: Homemaker  Tobacco Use   Smoking status: Never    Passive exposure: Never   Smokeless tobacco: Never  Vaping Use   Vaping status: Never Used  Substance and Sexual Activity   Alcohol use: Yes    Comment: less than one drink per month   Drug use: No   Sexual activity: Yes    Birth control/protection: Surgical  Other Topics Concern   Not on file  Social History Narrative   Stay at home   2 children -- 7 year old son and 10 year old daughter (2024)    Married   Social Drivers of Corporate investment banker Strain: Not on file  Food Insecurity: No Food Insecurity (06/06/2023)   Hunger Vital Sign    Worried About Running Out of Food in the Last Year: Never true    Ran Out of Food in the Last Year: Never true  Transportation Needs: No Transportation Needs (06/06/2023)   PRAPARE - Administrator, Civil Service (Medical): No    Lack of Transportation (Non-Medical): No  Physical Activity: Not on file  Stress: Not on file  Social Connections: Unknown (04/04/2023)   [facility]   Social Network    Social Network: Not on file    Allergies: No Known Allergies  Current Medications: Current Outpatient Medications  Medication Sig Dispense Refill   clonazePAM  (KLONOPIN ) 0.5 MG tablet Take 1 tablet (0.5 mg total) by mouth daily as needed for anxiety. 30 tablet 0   cloNIDine  (CATAPRES ) 0.1 MG tablet Take 1 tablet (0.1 mg total) by mouth 3 (three) times daily as needed (anxiety). 90 tablet 2   famotidine  (PEPCID ) 10 MG tablet Take 10 mg by mouth daily.     FLUoxetine  (PROZAC ) 10 MG capsule Take 1 capsule (10 mg total) by mouth daily. 90 capsule 0   lamoTRIgine  (LAMICTAL ) 200 MG tablet Take 1 tablet (200 mg total) by mouth daily for 11 days. 90 tablet 0   pantoprazole  (PROTONIX ) 40 MG tablet Take 1 tablet (40 mg total) by mouth daily. 30 tablet 0   sucralfate  (CARAFATE ) 1 g tablet Take 1 tablet (1 g total) by mouth 4 (four) times daily -  with meals and at bedtime for 7 days. 28 tablet 0   [START ON 06/28/2024] zolpidem  (AMBIEN  CR) 12.5 MG CR tablet Take 1 tablet (12.5 mg total) by mouth at bedtime as needed. for sleep 30 tablet 2   No current facility-administered medications for this visit.     Psychiatric Specialty Exam: Review of Systems  Last menstrual period 05/26/2024.There is no height or weight on file to calculate BMI.  General Appearance: Fairly Groomed  Eye Contact:  Good  Speech:  Clear and Coherent  and Pressured  Volume:  Normal  Mood:  Euthymic  Affect:  Congruent  Thought Process:  Coherent and Goal Directed  Orientation:  Full (Time, Place, and Person)  Thought Content: Logical   Suicidal Thoughts:  No  Homicidal Thoughts:  No  Memory:  Immediate;  Good  Judgement:  Good  Insight:  Fair  Psychomotor Activity:  Normal  Concentration:  Concentration: Good  Recall:  Good  Fund of Knowledge: Fair  Language: Good  Akathisia:  NA    AIMS (if indicated): not done  Assets:  Communication Skills Desire for Improvement Housing Talents/Skills Transportation  ADL's:  Intact  Cognition: WNL  Sleep:  Good   Metabolic Disorder Labs: Lab Results  Component Value Date   HGBA1C 5.3 10/05/2023   No results found for: PROLACTIN Lab Results  Component Value Date   CHOL 167 04/20/2023   TRIG 99.0 04/20/2023   HDL 56.30 04/20/2023   CHOLHDL 3 04/20/2023   VLDL 19.8 04/20/2023   LDLCALC 91 04/20/2023   Lab Results  Component Value Date   TSH 0.75 04/08/2024   TSH 0.46 10/05/2023    Therapeutic Level Labs: No results found for: LITHIUM No results found for: VALPROATE No results found for: CBMZ   Screenings: AUDIT    Flowsheet Row Admission (Discharged) from 06/06/2023 in [facility] INPATIENT BEHAVIORAL MEDICINE  Alcohol Use Disorder Identification Test Final Score (AUDIT) 0   GAD-7    Flowsheet Row Office Visit from 06/10/2024 in [facility] [employer] Visit from 10/05/2023 in [facility] [employer] from 04/20/2023 in [facility] [place] Video Visit from 12[facility]01/2022 [facility] Video Visit from 09/30/2022 in [facility] [place]  Total GAD-7 Score 4 21 21 20 3    PHQ2-9    Flowsheet Row Office Visit from 06/10/2024 in [facility] [place] [place] [employer] Visit from 10/05/2023 in  [facility] [place] [employer] Visit from 04/20/2023 in [facility] [place] Video Visit from 12/01/2022 in [facility] Office Visit from 08/25/2022 in [facility] [place]  PHQ-2 Total Score 0 1 3 1 6   PHQ-9 Total Score 0 9 15 11 18    Flowsheet Row UC from 01/22/2024 in [facility]) UC from 11/22/2023 in [facility]) Admission (Discharged) from 06/06/2023 in [facility] INPATIENT BEHAVIORAL MEDICINE  C-SSRS RISK CATEGORY No Risk No Risk Low Risk    Collaboration of Care: Collaboration of Care: Medication Management AEB medication prescription  Patient/Guardian was advised Release of Information must be obtained prior to any record release in order to collaborate their care with an outside provider. Patient/Guardian was advised if they have not already done so to contact the registration department to sign all necessary forms in order for us  to release information regarding their care.   Consent: Patient/Guardian gives verbal consent for treatment and assignment of benefits for services provided during this visit. Patient/Guardian expressed understanding and agreed to proceed.    Morgan CHRISTELLA Finder, MD 06/13/2024, 4:20 PM   Virtual Visit via Video Note  I connected with Morgan Chang on 06/13/24 at  4:00 PM EDT by a video enabled telemedicine application and verified that I am speaking with the correct person using two identifiers.  Location: Patient: Home Provider: Home Office   I discussed the limitations of evaluation and management by telemedicine and the availability of in person appointments. The patient expressed understanding and agreed to proceed.   I discussed the assessment and treatment plan with the patient. The patient was provided  an opportunity to ask questions and all were  answered. The patient agreed with the plan and demonstrated an understanding of the instructions.   The patient was advised to call back or seek an in-person evaluation if the symptoms worsen or if the condition fails to improve as anticipated.  I provided 25 minutes of non-face-to-face time during this encounter.   Morgan CHRISTELLA Finder, MD

## 2024-06-13 ENCOUNTER — Encounter (HOSPITAL_COMMUNITY): Payer: Self-pay | Admitting: Psychiatry

## 2024-06-13 ENCOUNTER — Telehealth (HOSPITAL_BASED_OUTPATIENT_CLINIC_OR_DEPARTMENT_OTHER): Admitting: Psychiatry

## 2024-06-13 DIAGNOSIS — F3181 Bipolar II disorder: Secondary | ICD-10-CM | POA: Diagnosis not present

## 2024-06-13 DIAGNOSIS — F411 Generalized anxiety disorder: Secondary | ICD-10-CM | POA: Diagnosis not present

## 2024-06-13 DIAGNOSIS — F430 Acute stress reaction: Secondary | ICD-10-CM | POA: Diagnosis not present

## 2024-06-13 DIAGNOSIS — F410 Panic disorder [episodic paroxysmal anxiety] without agoraphobia: Secondary | ICD-10-CM

## 2024-06-13 MED ORDER — ZOLPIDEM TARTRATE ER 12.5 MG PO TBCR: 12.5000 mg | EXTENDED_RELEASE_TABLET | Freq: Every evening | ORAL | 2 refills | Status: DC | PRN

## 2024-06-13 MED ORDER — LAMOTRIGINE 200 MG PO TABS: 200.0000 mg | ORAL_TABLET | Freq: Every day | ORAL | 0 refills | Status: DC

## 2024-06-13 MED ORDER — CLONIDINE HCL 0.1 MG PO TABS: 0.1000 mg | ORAL_TABLET | Freq: Three times a day (TID) | ORAL | 2 refills | Status: DC | PRN

## 2024-06-24 ENCOUNTER — Encounter: Payer: Self-pay | Admitting: Physician Assistant

## 2024-06-26 ENCOUNTER — Other Ambulatory Visit: Payer: Self-pay | Admitting: Physician Assistant

## 2024-06-26 DIAGNOSIS — R1013 Epigastric pain: Secondary | ICD-10-CM

## 2024-07-08 ENCOUNTER — Emergency Department (HOSPITAL_BASED_OUTPATIENT_CLINIC_OR_DEPARTMENT_OTHER)

## 2024-07-08 ENCOUNTER — Other Ambulatory Visit: Payer: Self-pay

## 2024-07-08 ENCOUNTER — Emergency Department (HOSPITAL_BASED_OUTPATIENT_CLINIC_OR_DEPARTMENT_OTHER)
Admission: EM | Admit: 2024-07-08 | Discharge: 2024-07-08 | Disposition: A | Discharge status: Home / Self Care | Attending: Emergency Medicine | Admitting: Emergency Medicine

## 2024-07-08 ENCOUNTER — Telehealth (HOSPITAL_BASED_OUTPATIENT_CLINIC_OR_DEPARTMENT_OTHER): Payer: Self-pay | Admitting: Emergency Medicine

## 2024-07-08 ENCOUNTER — Encounter (HOSPITAL_BASED_OUTPATIENT_CLINIC_OR_DEPARTMENT_OTHER): Payer: Self-pay

## 2024-07-08 DIAGNOSIS — R1033 Periumbilical pain: Secondary | ICD-10-CM | POA: Diagnosis not present

## 2024-07-08 DIAGNOSIS — R197 Diarrhea, unspecified: Secondary | ICD-10-CM | POA: Diagnosis present

## 2024-07-08 LAB — GASTROINTESTINAL PANEL BY PCR, STOOL (REPLACES STOOL CULTURE)

## 2024-07-08 LAB — CBC
HCT: 35.6 % — ABNORMAL LOW (ref 36.0–46.0)
Hemoglobin: 12.4 g/dL (ref 12.0–15.0)
MCH: 31.5 pg (ref 26.0–34.0)
MCHC: 34.8 g/dL (ref 30.0–36.0)
MCV: 90.4 fL (ref 80.0–100.0)
Platelets: 254 K/uL (ref 150–400)
RBC: 3.94 MIL/uL (ref 3.87–5.11)
RDW: 12.3 % (ref 11.5–15.5)
WBC: 5 K/uL (ref 4.0–10.5)
nRBC: 0 % (ref 0.0–0.2)

## 2024-07-08 LAB — URINALYSIS, ROUTINE W REFLEX MICROSCOPIC
Bilirubin Urine: NEGATIVE
Glucose, UA: NEGATIVE mg/dL
Hgb urine dipstick: NEGATIVE
Ketones, ur: NEGATIVE mg/dL
Nitrite: NEGATIVE
Protein, ur: NEGATIVE mg/dL
Specific Gravity, Urine: 1.023 (ref 1.005–1.030)
pH: 7.5 (ref 5.0–8.0)

## 2024-07-08 LAB — COMPREHENSIVE METABOLIC PANEL WITH GFR
ALT: 176 U/L — ABNORMAL HIGH (ref 0–44)
AST: 94 U/L — ABNORMAL HIGH (ref 15–41)
Albumin: 4.5 g/dL (ref 3.5–5.0)
Alkaline Phosphatase: 82 U/L (ref 38–126)
Anion gap: 10 (ref 5–15)
BUN: 12 mg/dL (ref 6–20)
CO2: 24 mmol/L (ref 22–32)
Calcium: 9.5 mg/dL (ref 8.9–10.3)
Chloride: 103 mmol/L (ref 98–111)
Creatinine, Ser: 0.83 mg/dL (ref 0.44–1.00)
GFR, Estimated: >60 mL/min (ref >60)
Glucose, Bld: 104 mg/dL — ABNORMAL HIGH (ref 70–99)
Potassium: 4.1 mmol/L (ref 3.5–5.1)
Sodium: 137 mmol/L (ref 135–145)
Total Bilirubin: 0.4 mg/dL (ref 0.0–1.2)
Total Protein: 6.7 g/dL (ref 6.5–8.1)

## 2024-07-08 LAB — LIPASE, BLOOD: Lipase: 16 U/L (ref 11–51)

## 2024-07-08 LAB — PREGNANCY, URINE: Preg Test, Ur: NEGATIVE

## 2024-07-08 MED ORDER — METOCLOPRAMIDE HCL 10 MG PO TABS: 10.0000 mg | ORAL_TABLET | Freq: Four times a day (QID) | ORAL | 0 refills | Status: AC

## 2024-07-08 MED ORDER — IOHEXOL 300 MG/ML  SOLN
100.0000 mL | Freq: Once | INTRAMUSCULAR | Status: AC | PRN
  Administered 2024-07-08: 80 mL via INTRAVENOUS

## 2024-07-08 MED ORDER — METOCLOPRAMIDE HCL 5 MG/ML IJ SOLN
10.0000 mg | Freq: Once | INTRAMUSCULAR | Status: AC
  Administered 2024-07-08: 10 mg via INTRAVENOUS
  Filled 2024-07-08: qty 2

## 2024-07-08 NOTE — Discharge Instructions (Signed)
 As we discussed, it is important to keep the pending appointment with gastroenterology. Use Reglan  if this helps with symptoms. Return to the ED with any high fever, severe pain, or new symptom of concern.

## 2024-07-08 NOTE — ED Triage Notes (Signed)
 Presents to ED with c/o abdominal pain for 6 weeks. Started on carafate , omeprazole , and pepcid . No relief of symptoms with meds. Pain worsens with food. Reports bright red blood in stool this AM.

## 2024-07-08 NOTE — ED Provider Notes (Signed)
 [place] EMERGENCY DEPARTMENT AT [facility] Provider Note   CSN: [id number] Arrival date & time: 07/08/24  0740     Patient presents with: Abdominal Pain   Morgan Chang is a 28 y.o. female.   Patient to ED with recurrent abdominal pain described as periumbilical. She reports this has been ongoing for the past 6 weeks, followed by PCP and attempted treatment with dietary changes, carafate  and Pepcid  which have not provided any relief. No fever, nausea, vomiting. She comes to ED today after starting to see bloody stools this morning. She describes stools as mucousy with bright red blood mixed in. This is the first time this has happened.   The history is provided by the patient. No language interpreter was used.  Abdominal Pain      Prior to Admission medications   Medication Sig Start Date End Date Taking? Authorizing Provider  metoCLOPramide  (REGLAN ) 10 MG tablet Take 1 tablet (10 mg total) by mouth every 6 (six) hours. 07/08/24  Yes Eagle Pitta, Margit, [name]  clonazePAM  (KLONOPIN ) 0.5 MG tablet Take 1 tablet (0.5 mg total) by mouth daily as needed for anxiety. 11/20/23   Carvin Arvella HERO, MD  cloNIDine  (CATAPRES ) 0.1 MG tablet Take 1 tablet (0.1 mg total) by mouth 3 (three) times daily as needed (anxiety). 06/13/24   Carvin Arvella HERO, MD  famotidine  (PEPCID ) 10 MG tablet Take 10 mg by mouth daily.    [provider]  FLUoxetine  (PROZAC ) 10 MG capsule Take 1 capsule (10 mg total) by mouth daily. 05/09/24 08/07/24  Carvin Arvella HERO, MD  lamoTRIgine  (LAMICTAL ) 200 MG tablet Take 1 tablet (200 mg total) by mouth daily for 11 days. 06/13/24 06/24/24  Carvin Arvella HERO, MD  pantoprazole  (PROTONIX ) 40 MG tablet Take 1 tablet (40 mg total) by mouth daily. 06/10/24   [name], PA  sucralfate  (CARAFATE ) 1 g tablet Take 1 tablet (1 g total) by mouth 4 (four) times daily -  with meals and at bedtime for 7 days. 06/10/24 06/17/24  [name], PA  zolpidem  (AMBIEN  CR) 12.5 MG CR  tablet Take 1 tablet (12.5 mg total) by mouth at bedtime as needed. for sleep 06/28/24   Carvin Arvella HERO, MD    Allergies: Patient has no known allergies.    Review of Systems  Gastrointestinal:  Positive for abdominal pain.    Updated Vital Signs BP 118/86   Pulse 65   Temp 98.4 F (36.9 C) (Oral)   Resp 18   Ht 5' 6 (1.676 m)   Wt 81.6 kg   LMP 07/02/2024 (Exact Date)   SpO2 100%   BMI 29.05 kg/m   Physical Exam Constitutional:      Appearance: She is well-developed.  HENT:     Head: Normocephalic.  Cardiovascular:     Rate and Rhythm: Normal rate and regular rhythm.     Heart sounds: No murmur heard. Pulmonary:     Effort: Pulmonary effort is normal.     Breath sounds: Normal breath sounds. No wheezing, rhonchi or rales.  Abdominal:     General: Bowel sounds are normal.     Palpations: Abdomen is soft.     Tenderness: There is abdominal tenderness in the periumbilical area. There is no guarding or rebound.     Comments: There is no focal tenderness to the RUQ or LUQ.   Musculoskeletal:        General: Normal range of motion.     Cervical back: Normal range of motion  and neck supple.  Skin:    General: Skin is warm and dry.  Neurological:     General: No focal deficit present.     Mental Status: She is alert and oriented to person, place, and time.     (all labs ordered are listed, but only abnormal results are displayed) Labs Reviewed  COMPREHENSIVE METABOLIC PANEL WITH GFR - Abnormal; Notable for the following components:      Result Value   Glucose, Bld 104 (*)    AST 94 (*)    ALT 176 (*)    All other components within normal limits  CBC - Abnormal; Notable for the following components:   HCT 35.6 (*)    All other components within normal limits  URINALYSIS, ROUTINE W REFLEX MICROSCOPIC - Abnormal; Notable for the following components:   APPearance HAZY (*)    Leukocytes,Ua TRACE (*)    Bacteria, UA RARE (*)    All other components within normal  limits  GASTROINTESTINAL PANEL BY PCR, STOOL (REPLACES STOOL CULTURE)  LIPASE, BLOOD  PREGNANCY, URINE    EKG: None  Radiology: CT ABDOMEN PELVIS W CONTRAST Result Date: 07/08/2024 CLINICAL DATA:  Abdominal pain for 6 weeks. EXAM: CT ABDOMEN AND PELVIS WITH CONTRAST TECHNIQUE: Multidetector CT imaging of the abdomen and pelvis was performed using the standard protocol following bolus administration of intravenous contrast. RADIATION DOSE REDUCTION: This exam was performed according to the departmental dose-optimization program which includes automated exposure control, adjustment of the mA and/or kV according to patient size and/or use of iterative reconstruction technique. CONTRAST:  80mL OMNIPAQUE  IOHEXOL  300 MG/ML  SOLN COMPARISON:  May 15, 2023 FINDINGS: Lower chest: No acute abnormality. Hepatobiliary: No focal liver abnormality is seen. No gallstones, gallbladder wall thickening, or biliary dilatation. Pancreas: Unremarkable. No pancreatic ductal dilatation or surrounding inflammatory changes. Spleen: Normal in size without focal abnormality. Adrenals/Urinary Tract: Adrenal glands are unremarkable. Kidneys are normal, without renal calculi, focal lesion, or hydronephrosis. Bladder is unremarkable. Stomach/Bowel: Stomach is within normal limits. Appendix appears normal. No evidence of bowel wall thickening, distention, or inflammatory changes. Vascular/Lymphatic: No significant vascular findings are present. No enlarged abdominal or pelvic lymph nodes. Reproductive: Uterus and bilateral adnexa are unremarkable. Other: No abdominal wall hernia or abnormality. No abdominopelvic ascites. Musculoskeletal: No acute or significant osseous findings. IMPRESSION: No acute or active process within the abdomen or pelvis. Electronically Signed   By: Suzen Dials M.D.   On: 07/08/2024 10:59     Procedures   Medications Ordered in the ED  iohexol  (OMNIPAQUE ) 300 MG/ML solution 100 mL (80 mLs  Intravenous Contrast Given 07/08/24 0935)  metoCLOPramide  (REGLAN ) injection 10 mg (10 mg Intravenous Given 07/08/24 1155)    Clinical Course as of 07/08/24 1223  Mon Jul 08, 2024  0933 Patient with persistent periumbilical abdominal pain for over 6 weeks, today with blood in her stools. No fever, N, V. Has a strong family history of bowel diseases such as Crohn's and UC. She has a first-time appointment with GI for November 11.  [SU]  1222 CT negative for acute process. No leukocytosis, normal hGB, normal electrolytes. She has pending GI follow up. Reglan  was givne in the ED and patient reports she feels better. Will provide Rx. Return precautions discussed.  [SU]    Clinical Course User Index [SU] Odell Balls, [name]  Medical Decision Making Amount and/or Complexity of Data Reviewed Labs: ordered. Radiology: ordered.  Risk Prescription drug management.        Final diagnoses:  Diarrhea, unspecified type    ED Discharge Orders          Ordered    metoCLOPramide  (REGLAN ) 10 MG tablet  Every 6 hours        07/08/24 1221               Odell Balls, [name] 07/08/24 1223    Jerrol Agent, MD 07/09/24 [phone]

## 2024-07-08 NOTE — ED Notes (Signed)
 Pt alert and oriented X 4 at the time of discharge. RR even and unlabored. No acute distress noted. Pt verbalized understanding of discharge instructions as discussed. Pt ambulatory to lobby at time of discharge.

## 2024-07-09 ENCOUNTER — Encounter: Payer: Self-pay | Admitting: Physician Assistant

## 2024-07-09 ENCOUNTER — Telehealth: Payer: Self-pay | Admitting: *Deleted

## 2024-07-09 NOTE — Telephone Encounter (Signed)
 Copied from CRM [id number]. Topic: General - Other >> Jul 09, 2024  2:16 PM Thersia C wrote: Reason for CRM: Patient called in regarding a stool sample, wanted to know if the results have been received and would like a callback

## 2024-07-09 NOTE — Telephone Encounter (Signed)
 Morgan Chang, pt went to ED yesterday and had a GI panel for stool done and was calling for results, and H Pylori test. Told her H Pylori was not a stool test and not done, but GI panel for stool was back, but not reviewed by you. She said she would call GI. She has an appt on 11/18 with GI.

## 2024-07-09 NOTE — Telephone Encounter (Signed)
 Spoke to pt she said she gave a stool specimen at the ED and wanted to know results. Told her results are back but Morgan Chang has not reviewed them from the ED. Pt said it was for H Pylori. Told her that is not back and it says future like it was not done. Told her H Pylori is not a stool specimen, it is a test were you breath into a bag. Pt verbalized understanding. Asked pt if she is following up with GI? Pt said yes, Nov 18 th. Pt said she will contact them.

## 2024-07-10 ENCOUNTER — Telehealth (HOSPITAL_BASED_OUTPATIENT_CLINIC_OR_DEPARTMENT_OTHER): Payer: Self-pay | Admitting: Emergency Medicine

## 2024-07-10 MED ORDER — CIPROFLOXACIN HCL 500 MG PO TABS: 500.0000 mg | ORAL_TABLET | Freq: Two times a day (BID) | ORAL | 0 refills | Status: AC

## 2024-07-10 NOTE — Telephone Encounter (Cosign Needed)
 Patient calling ED requesting follow-up on her stool cultures that were obtained 2 days ago.  Cultures positive for enteropathogenic E. coli.  Per chart review, it also appears that patient was suffering diarrhea and generalized abdominal pain x 6 weeks.  Patient also with bloody stools recently.  I was able to call patient on provided phone number and confirmed her birthday.  Patient stating that she still has a lot of abdominal pain.  I will start patient on antibiotics for the E. coli.  educated patient on side effects of ciprofloxacin.  Stressed to patient the importance of following up with her primary care in the next 48 hours.  Patient verbalized understanding of plan and is happy to be started on antibiotics.

## 2024-07-10 NOTE — Telephone Encounter (Signed)
 Noted

## 2024-07-19 ENCOUNTER — Inpatient Hospital Stay: Admitting: Physician Assistant

## 2024-08-01 ENCOUNTER — Inpatient Hospital Stay: Admitting: Physician Assistant

## 2024-08-02 ENCOUNTER — Encounter: Payer: Self-pay | Admitting: Physician Assistant

## 2024-08-05 NOTE — Progress Notes (Deleted)
 Morgan Console, [name] [address] [place], [place]  [address] Phone: [phone]   Gastroenterology Consultation  Referring Provider:     [name], [name] Primary Care Physician:  [name], [name] Primary Gastroenterologist:  Morgan Console, [name] / *** Reason for Consultation:     Stomach pain        HPI:   Discussed the use of AI scribe software for clinical note transcription with the patient, who gave verbal consent to proceed.  I last saw patient at [facility] 04/2023 to evaluate epigastric pain.  H. pylori stool test was positive.  Treated with amoxicillin , clarithromycin , omeprazole  twice daily x 14 days.  She is currently taking pantoprazole  40 mg once daily and Carafate  1 g 4 times daily as needed.  07/08/2024 labs: Elevated liver transaminases with ALT 176, AST 94.  Otherwise normal CMP.  Normal lipase 16.  Normal CBC with WBC 5.0, Hgb 12.4.  Negative UA.  Negative urine pregnancy test.  She has had tubal ligation.  GI pathogen panel positive for enteropathogenic E. coli.  All other pathogens negative.  H. pylori stool test ordered, but not completed.  History of Present Illness   09/2022: Normal RUQ ultrasound.  Remote history of Nissen fundoplication surgery in 2016. Remote history of stomach ulcers per patient report, records unavailable.   Family history significant for mom with IBS, maternal grandma with Crohn's, maternal grandpa with colon cancer.  Patient has never had a colonoscopy.   Past Medical History:  Diagnosis Date   Anemia 09/14/2021   Anxiety    Depression    Endometriosis    Fibroid    Frank breech presentation 10/29/2016   GERD (gastroesophageal reflux disease) 12/10/2014   Gestational diabetes    Migraines    Pregnancy induced hypertension    S/P cesarean section 10/29/2016    Past Surgical History:  Procedure Laterality Date   ADENOIDECTOMY  10/29/2007   BREAST REDUCTION SURGERY     BREAST SURGERY  08/2015   CESAREAN  SECTION N/A 10/29/2016   Procedure: CESAREAN SECTION;  Surgeon: Ezzie Buba, MD;  [facility];  Service: Obstetrics;  Laterality: N/A;   CESAREAN SECTION N/A 07/25/2021   Procedure: Repeat CESAREAN SECTION;  Surgeon: Kandyce Sor, MD;  Location: [facility];  Service: Obstetrics;  Laterality: N/A;   Repeat C/S BTL   LAPAROSCOPY  12/10/2014   NISSEN FUNDOPLICATION     RHINOPLASTY  03/22/2010   deviated septum   TONSILLECTOMY  2017   TUBAL LIGATION  07/25/2021    Prior to Admission medications   Medication Sig Start Date End Date Taking? Authorizing Provider  clonazePAM  (KLONOPIN ) 0.5 MG tablet Take 1 tablet (0.5 mg total) by mouth daily as needed for anxiety. 11/20/23   Carvin Arvella HERO, MD  cloNIDine  (CATAPRES ) 0.1 MG tablet Take 1 tablet (0.1 mg total) by mouth 3 (three) times daily as needed (anxiety). 06/13/24   Carvin Arvella HERO, MD  famotidine  (PEPCID ) 10 MG tablet Take 10 mg by mouth daily.    [provider]  FLUoxetine  (PROZAC ) 10 MG capsule Take 1 capsule (10 mg total) by mouth daily. 05/09/24 08/07/24  Carvin Arvella HERO, MD  lamoTRIgine  (LAMICTAL ) 200 MG tablet Take 1 tablet (200 mg total) by mouth daily for 11 days. 06/13/24 06/24/24  Carvin Arvella HERO, MD  metoCLOPramide  (REGLAN ) 10 MG tablet Take 1 tablet (10 mg total) by mouth every 6 (six) hours. 07/08/24   Odell Balls, [name]  pantoprazole  (  PROTONIX ) 40 MG tablet Take 1 tablet (40 mg total) by mouth daily. 06/10/24   [name], PA  sucralfate  (CARAFATE ) 1 g tablet Take 1 tablet (1 g total) by mouth 4 (four) times daily -  with meals and at bedtime for 7 days. 06/10/24 06/17/24  [name], PA  zolpidem  (AMBIEN  CR) 12.5 MG CR tablet Take 1 tablet (12.5 mg total) by mouth at bedtime as needed. for sleep 06/28/24   Carvin Arvella HERO, MD    Family History  Problem Relation Age of Onset   Heart disease Mother    Hypertension Mother    Anxiety disorder Mother    Depression Mother    Alcohol  abuse Mother    Arthritis Mother    Hypertension Father    Anxiety disorder Father    Depression Father    Heart attack Father    Heart failure Father    Testicular cancer Brother    Diabetes Maternal Grandmother    Heart attack Maternal Grandfather        x 2     Social History   Tobacco Use   Smoking status: Never    Passive exposure: Never   Smokeless tobacco: Never  Vaping Use   Vaping status: Never Used  Substance Use Topics   Alcohol use: Yes    Comment: less than one drink per month   Drug use: No    Allergies as of 08/06/2024   (No Known Allergies)    Review of Systems:    All systems reviewed and negative except where noted in HPI.   Physical Exam:  LMP 07/02/2024 (Exact Date)  Patient's last menstrual period was 07/02/2024 (exact date).  General:   Alert,  Well-developed, well-nourished, pleasant and cooperative in NAD Lungs:  Respirations even and unlabored.  Clear throughout to auscultation.   No wheezes, crackles, or rhonchi. No acute distress. Heart:  Regular rate and rhythm; no murmurs, clicks, rubs, or gallops. Abdomen:  Normal bowel sounds.  No bruits.  Soft, and non-distended without masses, hepatosplenomegaly or hernias noted.  No Tenderness.  No guarding or rebound tenderness.    Neurologic:  Alert and oriented x3;  grossly normal neurologically. Psych:  Alert and cooperative. Normal mood and affect.   Imaging Studies: CT ABDOMEN PELVIS W CONTRAST Result Date: 07/08/2024 CLINICAL DATA:  Abdominal pain for 6 weeks. EXAM: CT ABDOMEN AND PELVIS WITH CONTRAST TECHNIQUE: Multidetector CT imaging of the abdomen and pelvis was performed using the standard protocol following bolus administration of intravenous contrast. RADIATION DOSE REDUCTION: This exam was performed according to the departmental dose-optimization program which includes automated exposure control, adjustment of the mA and/or kV according to patient size and/or use of iterative  reconstruction technique. CONTRAST:  80mL OMNIPAQUE  IOHEXOL  300 MG/ML  SOLN COMPARISON:  May 15, 2023 FINDINGS: Lower chest: No acute abnormality. Hepatobiliary: No focal liver abnormality is seen. No gallstones, gallbladder wall thickening, or biliary dilatation. Pancreas: Unremarkable. No pancreatic ductal dilatation or surrounding inflammatory changes. Spleen: Normal in size without focal abnormality. Adrenals/Urinary Tract: Adrenal glands are unremarkable. Kidneys are normal, without renal calculi, focal lesion, or hydronephrosis. Bladder is unremarkable. Stomach/Bowel: Stomach is within normal limits. Appendix appears normal. No evidence of bowel wall thickening, distention, or inflammatory changes. Vascular/Lymphatic: No significant vascular findings are present. No enlarged abdominal or pelvic lymph nodes. Reproductive: Uterus and bilateral adnexa are unremarkable. Other: No abdominal wall hernia or abnormality. No abdominopelvic ascites. Musculoskeletal: No acute or significant osseous findings. IMPRESSION: No acute or  active process within the abdomen or pelvis. Electronically Signed   By: Suzen Dials M.D.   On: 07/08/2024 10:59    Labs: CBC    Component Value Date/Time   WBC 5.0 07/08/2024 0753   RBC 3.94 07/08/2024 0753   HGB 12.4 07/08/2024 0753   HCT 35.6 (L) 07/08/2024 0753   PLT 254 07/08/2024 0753   MCV 90.4 07/08/2024 0753    CMP     Component Value Date/Time   NA 137 07/08/2024 0753   K 4.1 07/08/2024 0753   CL 103 07/08/2024 0753   CO2 24 07/08/2024 0753   GLUCOSE 104 (H) 07/08/2024 0753   BUN 12 07/08/2024 0753   CREATININE 0.83 07/08/2024 0753   CALCIUM 9.5 07/08/2024 0753   PROT 6.7 07/08/2024 0753   ALBUMIN 4.5 07/08/2024 0753   AST 94 (H) 07/08/2024 0753   ALT 176 (H) 07/08/2024 0753   ALKPHOS 82 07/08/2024 0753   BILITOT 0.4 07/08/2024 0753   GFRNONAA >60 07/08/2024 0753   GFRAA >60 10/29/2016 1455    Assessment and Plan:   Pearle Wandler is a  29 y.o. y/o female has been referred for   1.  Epigastric pain - Schedule EGD  2.  History of H. pylori infection - Order [facility] H. pylori stool test  3.  GERD; remote history of Nissen fundoplication  Assessment and Plan Assessment & Plan       Follow up ***  Morgan Console, [name]

## 2024-08-06 ENCOUNTER — Ambulatory Visit: Admitting: Physician Assistant

## 2024-08-26 NOTE — Progress Notes (Unsigned)
 [facility] MD/PA/NP OP Progress Note  08/27/2024 3:59 PM Isaly Fasching  MRN:  [id number]  Visit Diagnosis:    ICD-10-CM   1. Generalized anxiety disorder  F41.1 cloNIDine  (CATAPRES ) 0.1 MG tablet    FLUoxetine  (PROZAC ) 10 MG capsule    zolpidem  (AMBIEN  CR) 12.5 MG CR tablet    2. Panic attack as reaction to stress  F41.0 cloNIDine  (CATAPRES ) 0.1 MG tablet   F43.0 FLUoxetine  (PROZAC ) 10 MG capsule    3. Bipolar 2 disorder (HCC)  F31.81 zolpidem  (AMBIEN  CR) 12.5 MG CR tablet    lamoTRIgine  (LAMICTAL ) 200 MG tablet      Assessment: Durene Ollinger is a 28 y.o. female with a history of MDD, sleep difficulties and reported ADHD who presented to [facility] for initial evaluation on 12/02/2022.  During initial evaluation patient reported significant symptoms of anxiety including excessive worry control, difficulty relaxing, racing thoughts worse at night that affects sleep, restlessness, increased irritability, and fears about happening.  She also endorsed experiencing panic attacks which started in December 2023 and occur once every couple days.  During the episodes of panic patient reported symptoms of diaphoresis, increased restlessness, picking behaviors, and dissociations.  Patient also does note some depressive symptoms, that appear to be a bit better managed after starting the Trintellix .  She denied any SI or thoughts of self-harm along with any history of mania or psychosis. Patient met criteria for generalized anxiety disorder and panic attacks.   Over the course of treatment patient had an episode which was concerning for hypomania in July-August of 2024.  Patient had described increased irritability, mood lability, negative self thoughts, feelings of worthlessness, insomnia, and passive SI for a 4-day period. Patient had presented to [facility] who started Abilify  and patient noted some improvement in her symptoms following this. With this episode patient also meets  criteria for Bipolar 2 disorder.  Kaleesi Locicero presents for follow-up evaluation. Today, 08/27/24, patient    that anxiety and mood are well-controlled and she has decreased her as needed clonidine  use to 2 times a day.  She has not needed to use the Klonopin  in the interim.  Patient did self discontinued atomoxetine  due to lack of benefit.  Given slower titration due to concern of triggering anxiety or hypomania, the limited benefit is not atypical.  Regardless patient has had some improvement in concentration since switching to a consistent routine.  Given that would be appropriate to hold atomoxetine .  Will continue remainder of current regimen and follow-up in 3 months.  Plan: - Continue Prozac  10 mg daily  - Continue Lamictal  200 mg daily - Start Vyvanse  10 mg daily - Continue clonidine  0.1 mg to TID prn for anxiety - Continue Klonopin  0.5 mg every day prn for anxiety (has not used in 6 weeks) - Continue Ambien  to CR 12.5 mg QHS - CMP, CBC, TSH, Vit D, cortisol reviewed - Crisis resources reviewed - Therapy with [facility] and working through [facility], wants to eventually switch to [employer] for insurance and we will refer once openings are available - Follow up in 6 weeks  Chief Complaint:  No chief complaint on file.  HPI: On presentation today Jonni reports that everything has been going really well  Other then her adhd has been really bad. She can find herself standing in the kitchen and feel bad and overwhelmed when  Thinks at work are going well at work. She can still feel completel overwhelmed and feel paralyzed  She  has a list that she goes through, looks at it and feels paralyzed. Her mind races to a point that she feels her mind shuts down.  Patient reports that this is a consistent issue and that she can frequently feel paralyzed needing to write things down such as stand up and start walking.  Has taken clonidine  during these incidents with some benefit.  The  benefit seems to be variable based on how the day has gone.  More stressful days late to limited benefit.  When she is unable to complete the task she can get down on herself and feel increasingly negative.  Sleep been good, and has been able to go a few nights sleeping well without taking Ambien  Denies any depression Overall feels anxiety is relatively well-managed and the clonidine  is effective.  Has not needed klonopin  In 2 months   things have gone quite well for her in the interim.  Her anxiety and depression have been well-controlled with no flareups.  She also has an improvement in her psychosocial stressors.  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  She had been getting her degree in early education but did not finish her bachelor's.  She had initially worked in this field previously she feels much more comfortable now compared to past.  Furthermore they are going to help finance her school and she is going to finish her bachelor's degree.  The attention is still all over the place but she has found the added structure from a consistent schedule to be helpful.  She discontinued atomoxetine  and she does not find it to be particularly helpful.  We reviewed that the dose she is on is unlikely to be significantly beneficial and the plan was to slowly titrate.  The lower starting dose was 2 manage concern of triggering anxiety or hypomania given patient's anxiety/bipolar disorder.  Regardless given current improvement it is appropriate to hold off on the atomoxetine .  Sleep has also been good.  Patient has noticed that if she uses Ambien  consistently it becomes less effective after 30 days.  She started to hold it a few days a month and has noticed this significantly improves its effect when she restarts it.  She has not needed to use  the Klonopin  in the interim and has decreased the clonidine  use to 2 times a day.  Past Psychiatric History: The patient has one prior psychiatric hospitalization to [facility] in September of 2024 due to worsening depression and SI. She denies any suicide attempts.  She has been connected with a couple therapists in the past, though is currently not seeing anyone after her last therapist left the practice in January.   She has tried mirtazapine , Wellbutrin , Prozac , Zoloft , Lexapro (headaches), Celexa , Trintellix  (loss of benefit over time), venlafaxine (orthostatic hypotension), BuSpar, propranolol  (helped with akathisia, Atarax  (sleepy), Seroquel  (hair loss), Zyprexa  (oral motor issues), Risperdal  (lactation), trazodone, Depakote, Adderall, Vyvanse , Ritalin , atomoxetine  (10 mg dose ineffective did not titrate higher), Klonopin , Xanax, Lunesta , Ambien   She denies any substance use denies other than one alcoholic drink a week.   Past Medical History:  Past Medical History:  Diagnosis Date   Anemia 09/14/2021   Anxiety    Depression    Endometriosis    Fibroid    Frank breech presentation 10/29/2016   GERD (gastroesophageal reflux disease)  12/10/2014   Gestational diabetes    Migraines    Pregnancy induced hypertension    S/P cesarean section 10/29/2016    Past Surgical History:  Procedure Laterality Date   ADENOIDECTOMY  10/29/2007   BREAST REDUCTION SURGERY     BREAST SURGERY  08/2015   CESAREAN SECTION N/A 10/29/2016   Procedure: CESAREAN SECTION;  Surgeon: Ezzie Buba, MD;  [facility];  Service: Obstetrics;  Laterality: N/A;   CESAREAN SECTION N/A 07/25/2021   Procedure: Repeat CESAREAN SECTION;  Surgeon: Kandyce Sor, MD;  Location: [facility];  Service: Obstetrics;  Laterality: N/A;   Repeat C/S BTL   LAPAROSCOPY  12/10/2014   NISSEN FUNDOPLICATION     RHINOPLASTY  03/22/2010   deviated septum   TONSILLECTOMY  2017   TUBAL LIGATION  07/25/2021     Family History:  Family History  Problem Relation Age of Onset   Heart disease Mother    Hypertension Mother    Anxiety disorder Mother    Depression Mother    Alcohol abuse Mother    Arthritis Mother    Hypertension Father    Anxiety disorder Father    Depression Father    Heart attack Father    Heart failure Father    Testicular cancer Brother    Diabetes Maternal Grandmother    Heart attack Maternal Grandfather        x 2    Social History:  Social History   Socioeconomic History   Marital status: Married    Spouse name: Not on file   Number of children: Not on file   Years of education: Not on file   Highest education level: Not on file  Occupational History   Occupation: Homemaker  Tobacco Use   Smoking status: Never    Passive exposure: Never   Smokeless tobacco: Never  Vaping Use   Vaping status: Never Used  Substance and Sexual Activity   Alcohol use: Yes    Comment: less than one drink per month   Drug use: No   Sexual activity: Yes    Birth control/protection: Surgical  Other Topics Concern   Not on file  Social History Narrative   Stay at home   2 children -- 59 year old son and 73 year old daughter (2024)   Married   Social Drivers of Corporate Investment Banker Strain: Not on file  Food Insecurity: No Food Insecurity (06/06/2023)   Hunger Vital Sign    Worried About Running Out of Food in the Last Year: Never true    Ran Out of Food in the Last Year: Never true  Transportation Needs: No Transportation Needs (06/06/2023)   PRAPARE - Administrator, Civil Service (Medical): No    Lack of Transportation (Non-Medical): No  Physical Activity: Not on file  Stress: Not on file  Social Connections: Unknown (04/04/2023)   [facility]   Social Network    Social Network: Not on file    Allergies: No Known Allergies  Current Medications: Current Outpatient Medications  Medication Sig Dispense Refill    lisdexamfetamine (VYVANSE ) 10 MG capsule Take 1 capsule (10 mg total) by mouth daily. 30 capsule 0   [START ON 09/26/2024] lisdexamfetamine (VYVANSE ) 10 MG capsule Take 1 capsule (10 mg total) by mouth daily. 30 capsule 0   clonazePAM  (KLONOPIN ) 0.5 MG tablet Take 1 tablet (0.5 mg total) by mouth daily as needed for anxiety. 30 tablet 0  cloNIDine  (CATAPRES ) 0.1 MG tablet Take 1 tablet (0.1 mg total) by mouth 3 (three) times daily as needed (anxiety). 90 tablet 2   famotidine  (PEPCID ) 10 MG tablet Take 10 mg by mouth daily.     [START ON 09/19/2024] FLUoxetine  (PROZAC ) 10 MG capsule Take 1 capsule (10 mg total) by mouth daily. 90 capsule 0   lamoTRIgine  (LAMICTAL ) 200 MG tablet Take 1 tablet (200 mg total) by mouth daily. 90 tablet 0   metoCLOPramide  (REGLAN ) 10 MG tablet Take 1 tablet (10 mg total) by mouth every 6 (six) hours. 30 tablet 0   pantoprazole  (PROTONIX ) 40 MG tablet Take 1 tablet (40 mg total) by mouth daily. 30 tablet 0   sucralfate  (CARAFATE ) 1 g tablet Take 1 tablet (1 g total) by mouth 4 (four) times daily -  with meals and at bedtime for 7 days. 28 tablet 0   [START ON 09/19/2024] zolpidem  (AMBIEN  CR) 12.5 MG CR tablet Take 1 tablet (12.5 mg total) by mouth at bedtime as needed. for sleep 30 tablet 2   No current facility-administered medications for this visit.     Psychiatric Specialty Exam: Review of Systems  There were no vitals taken for this visit.There is no height or weight on file to calculate BMI.  General Appearance: Fairly Groomed  Eye Contact:  Good  Speech:  Clear and Coherent and Pressured  Volume:  Normal  Mood:  Euthymic  Affect:  Congruent  Thought Process:  Coherent and Goal Directed  Orientation:  Full (Time, Place, and Person)  Thought Content: Logical   Suicidal Thoughts:  No  Homicidal Thoughts:  No  Memory:  Immediate;   Good  Judgement:  Good  Insight:  Fair  Psychomotor Activity:  Normal  Concentration:  Concentration: Good  Recall:  Good   Fund of Knowledge: Fair  Language: Good  Akathisia:  NA    AIMS (if indicated): not done  Assets:  Communication Skills Desire for Improvement Housing Talents/Skills Transportation  ADL's:  Intact  Cognition: WNL  Sleep:  Good   Metabolic Disorder Labs: Lab Results  Component Value Date   HGBA1C 5.3 10/05/2023   No results found for: PROLACTIN Lab Results  Component Value Date   CHOL 167 04/20/2023   TRIG 99.0 04/20/2023   HDL 56.30 04/20/2023   CHOLHDL 3 04/20/2023   VLDL 19.8 04/20/2023   LDLCALC 91 04/20/2023   Lab Results  Component Value Date   TSH 0.75 04/08/2024   TSH 0.46 10/05/2023    Therapeutic Level Labs: No results found for: LITHIUM No results found for: VALPROATE No results found for: CBMZ   Screenings: AUDIT    Flowsheet Row Admission (Discharged) from 06/06/2023 in [facility]  Alcohol Use Disorder Identification Test Final Score (AUDIT) 0   GAD-7    Flowsheet Row Office Visit from 06/10/2024 in [facility] from 10/05/2023 in [facility] from 04[facility]20/2023 in [facility] Video Visit from [facility]2022 [facility] Video Visit from 09/30/2022 in [facility] [place]  Total GAD-7 Score 4 21 21 20 3    PHQ2-9    Flowsheet Row Office Visit from 06/10/2024 in [facility] from 10/05/2023 in [facility] from 04/20/2023 in [facility] [place] Video Visit from 12/01/2022 in [facility] Office Visit from 08/25/2022 in [facility] Total Score 0 1 3 1 6   PHQ-9 Total Score 0 9 15 11 18    Flowsheet Row ED from 07/08/2024 in  [facility] at [facility] from 01/22/2024 in [facility] at [facility]) UC from 11/22/2023 in [facility] at [facility])  C-SSRS RISK CATEGORY No Risk No Risk No Risk    Collaboration of Care: Collaboration of Care: Medication Management AEB medication prescription and Other provider involved in patient's care AEB ED and PCP chart review  Patient/Guardian was advised Release of Information must be obtained prior to any record release in order to collaborate their care with an outside provider. Patient/Guardian was advised if they have not already done so to contact the registration department to sign all necessary forms in order for us  to release information regarding their care.   Consent: Patient/Guardian gives verbal consent for treatment and assignment of benefits for services provided during this visit. Patient/Guardian expressed understanding and agreed to proceed.    Arvella CHRISTELLA Finder, MD 08/27/2024, 3:59 PM   Virtual Visit via Video Note  I connected with Tashianna Littlefield on 08/27/24 at  3:30 PM EST by a video enabled telemedicine application and verified that I am speaking with the correct person using two identifiers.  Location: Patient: Home Provider: Home Office   I discussed the limitations of evaluation and management by telemedicine and the availability of in person appointments. The patient expressed understanding and agreed to proceed.   I discussed the assessment and treatment plan with the patient. The patient was provided an opportunity to ask questions and all were answered. The patient agreed with the plan and demonstrated an understanding of the instructions.   The patient was advised to call back or seek an in-person evaluation if the symptoms worsen or if the condition fails to improve as anticipated.  I provided 25 minutes of non-face-to-face time during this encounter.   Arvella CHRISTELLA Finder, MD

## 2024-08-27 ENCOUNTER — Other Ambulatory Visit: Payer: Self-pay

## 2024-08-27 ENCOUNTER — Telehealth (HOSPITAL_COMMUNITY): Admitting: Psychiatry

## 2024-08-27 ENCOUNTER — Other Ambulatory Visit (HOSPITAL_COMMUNITY): Payer: Self-pay

## 2024-08-27 DIAGNOSIS — F411 Generalized anxiety disorder: Secondary | ICD-10-CM

## 2024-08-27 DIAGNOSIS — F430 Acute stress reaction: Secondary | ICD-10-CM

## 2024-08-27 DIAGNOSIS — F3181 Bipolar II disorder: Secondary | ICD-10-CM

## 2024-08-27 MED ORDER — CLONIDINE HCL 0.1 MG PO TABS: 0.1000 mg | ORAL_TABLET | Freq: Three times a day (TID) | ORAL | 2 refills | Status: AC | PRN

## 2024-08-27 MED ORDER — LISDEXAMFETAMINE DIMESYLATE 10 MG PO CAPS
10.0000 mg | ORAL_CAPSULE | Freq: Every day | ORAL | 0 refills | Status: AC
  Filled 2024-08-27: qty 30, 30d supply, fill #0

## 2024-08-27 MED ORDER — FLUOXETINE HCL 10 MG PO CAPS: 10.0000 mg | ORAL_CAPSULE | Freq: Every day | ORAL | 0 refills | Status: AC

## 2024-08-27 MED ORDER — LISDEXAMFETAMINE DIMESYLATE 10 MG PO CAPS: 10.0000 mg | ORAL_CAPSULE | Freq: Every day | ORAL | 0 refills | Status: AC

## 2024-08-27 MED ORDER — ZOLPIDEM TARTRATE ER 12.5 MG PO TBCR: 12.5000 mg | EXTENDED_RELEASE_TABLET | Freq: Every evening | ORAL | 2 refills | Status: AC | PRN

## 2024-08-27 MED ORDER — LAMOTRIGINE 200 MG PO TABS: 200.0000 mg | ORAL_TABLET | Freq: Every day | ORAL | 0 refills | Status: AC

## 2024-08-28 ENCOUNTER — Encounter (HOSPITAL_COMMUNITY): Payer: Self-pay | Admitting: Psychiatry

## 2024-09-06 ENCOUNTER — Other Ambulatory Visit (HOSPITAL_COMMUNITY): Payer: Self-pay

## 2024-09-13 ENCOUNTER — Ambulatory Visit
Admission: RE | Admit: 2024-09-13 | Discharge: 2024-09-13 | Disposition: A | Discharge status: Home / Self Care | Attending: Internal Medicine | Admitting: Internal Medicine

## 2024-09-13 VITALS — BP 128/87 | HR 90 | Temp 100.1°F | Resp 18

## 2024-09-13 DIAGNOSIS — R0981 Nasal congestion: Secondary | ICD-10-CM

## 2024-09-13 DIAGNOSIS — R051 Acute cough: Secondary | ICD-10-CM | POA: Diagnosis not present

## 2024-09-13 DIAGNOSIS — J029 Acute pharyngitis, unspecified: Secondary | ICD-10-CM | POA: Diagnosis not present

## 2024-09-13 DIAGNOSIS — J040 Acute laryngitis: Secondary | ICD-10-CM | POA: Diagnosis not present

## 2024-09-13 DIAGNOSIS — H6502 Acute serous otitis media, left ear: Secondary | ICD-10-CM | POA: Diagnosis not present

## 2024-09-13 LAB — POCT INFLUENZA A/B
Influenza A, POC: NEGATIVE
Influenza B, POC: NEGATIVE

## 2024-09-13 LAB — POC SOFIA SARS ANTIGEN FIA: SARS Coronavirus 2 Ag: NEGATIVE

## 2024-09-13 LAB — POCT RAPID STREP A (OFFICE): Rapid Strep A Screen: NEGATIVE

## 2024-09-13 MED ORDER — AMOXICILLIN 875 MG PO TABS: 875.0000 mg | ORAL_TABLET | Freq: Two times a day (BID) | ORAL | 0 refills | Status: AC

## 2024-09-13 MED ORDER — FLUTICASONE PROPIONATE 50 MCG/ACT NA SUSP: 1.0000 | Freq: Every day | NASAL | 0 refills | Status: AC | PRN

## 2024-09-13 MED ORDER — AMOXICILLIN 875 MG PO TABS: 875.0000 mg | ORAL_TABLET | Freq: Two times a day (BID) | ORAL | 0 refills | Status: DC

## 2024-09-13 MED ORDER — FLUTICASONE PROPIONATE 50 MCG/ACT NA SUSP: 1.0000 | Freq: Every day | NASAL | 0 refills | Status: DC | PRN

## 2024-09-13 MED ORDER — BENZONATATE 200 MG PO CAPS: 200.0000 mg | ORAL_CAPSULE | Freq: Three times a day (TID) | ORAL | 0 refills | Status: DC | PRN

## 2024-09-13 MED ORDER — BENZONATATE 200 MG PO CAPS: 200.0000 mg | ORAL_CAPSULE | Freq: Three times a day (TID) | ORAL | 0 refills | Status: AC | PRN

## 2024-09-13 NOTE — ED Provider Notes (Signed)
 " [facility]  Note:  This document was prepared using Dragon voice recognition software and may include unintentional dictation errors.  MRN: [id number] DOB: 26-Mar-1996 DATE: 09/13/2024   Subjective:  Chief Complaint:  Chief Complaint  Patient presents with   Cough    No voice at all - Entered by patient   Chills   Nasal Congestion     HPI: Rever Pichette is a 28 y.o. female presenting for congestion and cough for the past 2 days. Patient states she started with congestion and runny nose yesterday. She states she woke up the this morning with chills and myalgias. Mother with recent sinus infection. She reports losing her voice as well yesterday. Patient reports left otalgia. She reports taking dayquil and tylenol  with little relief. Denies fever, nausea/vomiting, abdominal pain. Endorses cough, congestion, sore throat, laryngitis, left otalgia. Presents NAD.  Prior to Admission medications  Medication Sig Start Date End Date Taking? Authorizing Provider  clonazePAM  (KLONOPIN ) 0.5 MG tablet Take 1 tablet (0.5 mg total) by mouth daily as needed for anxiety. 11/20/23   Carvin Arvella HERO, MD  cloNIDine  (CATAPRES ) 0.1 MG tablet Take 1 tablet (0.1 mg total) by mouth 3 (three) times daily as needed (anxiety). 08/27/24   Carvin Arvella HERO, MD  famotidine  (PEPCID ) 10 MG tablet Take 10 mg by mouth daily.    [provider]  FLUoxetine  (PROZAC ) 10 MG capsule Take 1 capsule (10 mg total) by mouth daily. 09/19/24 12/18/24  Carvin Arvella HERO, MD  lamoTRIgine  (LAMICTAL ) 200 MG tablet Take 1 tablet (200 mg total) by mouth daily. 08/27/24 11/25/24  Carvin Arvella HERO, MD  lisdexamfetamine  (VYVANSE ) 10 MG capsule Take 1 capsule (10 mg total) by mouth daily. 08/27/24   Carvin Arvella HERO, MD  lisdexamfetamine  (VYVANSE ) 10 MG capsule Take 1 capsule (10 mg total) by mouth daily. 09/26/24   Carvin Arvella HERO, MD  metoCLOPramide  (REGLAN ) 10 MG tablet Take 1 tablet (10 mg total) by mouth every 6 (six) hours. 07/08/24    Odell Balls, [name]  pantoprazole  (PROTONIX ) 40 MG tablet Take 1 tablet (40 mg total) by mouth daily. 06/10/24   [name], PA  sucralfate  (CARAFATE ) 1 g tablet Take 1 tablet (1 g total) by mouth 4 (four) times daily -  with meals and at bedtime for 7 days. 06/10/24 06/17/24  [name], PA  zolpidem  (AMBIEN  CR) 12.5 MG CR tablet Take 1 tablet (12.5 mg total) by mouth at bedtime as needed. for sleep 09/19/24   Carvin Arvella HERO, MD     Allergies[1]  History:   Past Medical History:  Diagnosis Date   Anemia 09/14/2021   Anxiety    Depression    Endometriosis    Fibroid    Frank breech presentation 10/29/2016   GERD (gastroesophageal reflux disease) 12/10/2014   Gestational diabetes    Migraines    Pregnancy induced hypertension    S/P cesarean section 10/29/2016     Past Surgical History:  Procedure Laterality Date   ADENOIDECTOMY  10/29/2007   BREAST REDUCTION SURGERY     BREAST SURGERY  08/2015   CESAREAN SECTION N/A 10/29/2016   Procedure: CESAREAN SECTION;  Surgeon: Ezzie Buba, MD;  Location: [facility];  Service: Obstetrics;  Laterality: N/A;   CESAREAN SECTION N/A 07/25/2021   Procedure: Repeat CESAREAN SECTION;  Surgeon: Kandyce Sor, MD;  Location: [facility];  Service: Obstetrics;  Laterality: N/A;   Repeat C/S BTL   LAPAROSCOPY  12/10/2014   NISSEN FUNDOPLICATION  RHINOPLASTY  03/22/2010   deviated septum   TONSILLECTOMY  2017   TUBAL LIGATION  07/25/2021    Family History  Problem Relation Age of Onset   Heart disease Mother    Hypertension Mother    Anxiety disorder Mother    Depression Mother    Alcohol abuse Mother    Arthritis Mother    Hypertension Father    Anxiety disorder Father    Depression Father    Heart attack Father    Heart failure Father    Testicular cancer Brother    Diabetes Maternal Grandmother    Heart attack Maternal Grandfather        x 2    Social History[2]  Review of Systems   Constitutional:  Negative for fever.  HENT:  Positive for congestion, ear pain, rhinorrhea and sore throat.   Respiratory:  Positive for cough.   Gastrointestinal:  Negative for abdominal pain, nausea and vomiting.  Musculoskeletal:  Positive for myalgias.     Objective:   Vitals: BP 128/87 (BP Location: Right Arm)   Pulse 90   Temp 100.1 F (37.8 C) (Oral)   Resp 18   LMP 09/12/2024   SpO2 98%   Physical Exam Constitutional:      General: She is not in acute distress.    Appearance: Normal appearance. She is well-developed and normal weight. She is not ill-appearing or toxic-appearing.  HENT:     Head: Normocephalic and atraumatic.     Right Ear: Ear canal normal. A middle ear effusion is present.     Left Ear: A middle ear effusion is present. Tympanic membrane is erythematous.     Ears:     Comments: Left TM slightly erythematous with opacified effusion.    Nose: Rhinorrhea present. Rhinorrhea is clear.     Right Turbinates: Swollen.     Left Turbinates: Swollen.     Mouth/Throat:     Pharynx: Uvula midline. Posterior oropharyngeal erythema present. No pharyngeal swelling or oropharyngeal exudate.     Tonsils: No tonsillar exudate or tonsillar abscesses.  Cardiovascular:     Rate and Rhythm: Normal rate and regular rhythm.     Heart sounds: Normal heart sounds.  Pulmonary:     Effort: Pulmonary effort is normal.     Breath sounds: Normal breath sounds.     Comments: Clear to auscultation bilaterally  Abdominal:     General: Bowel sounds are normal.     Palpations: Abdomen is soft.     Tenderness: There is no abdominal tenderness.  Skin:    General: Skin is warm and dry.  Neurological:     General: No focal deficit present.     Mental Status: She is alert.  Psychiatric:        Mood and Affect: Mood and affect normal.     Results:  Labs: Results for orders placed or performed during the hospital encounter of 09/13/24 (from the past 24 hours)  POCT  Influenza A/B     Status: None   Collection Time: 09/13/24  1:15 PM  Result Value Ref Range   Influenza A, POC Negative Negative   Influenza B, POC Negative Negative  POC SARS Coronavirus 2 Ag     Status: None   Collection Time: 09/13/24  1:15 PM  Result Value Ref Range   SARS Coronavirus 2 Ag Negative Negative    Radiology: No results found.   UC Course/Treatments:  Procedures: Procedures   Medications Ordered in UC: Medications -  No data to display   Assessment and Plan :     ICD-10-CM   1. Non-recurrent acute serous otitis media of left ear  H65.02     2. Acute cough  R05.1 POCT Influenza A/B    POC SARS Coronavirus 2 Ag    POCT Influenza A/B    POC SARS Coronavirus 2 Ag    3. Nasal congestion  R09.81     4. Acute pharyngitis, unspecified etiology  J02.9 POCT rapid strep A    POCT rapid strep A    5. Laryngitis  J04.0      Non-recurrent acute serous otitis media of left ear Afebrile, nontoxic-appearing, NAD. VSS. DDX includes but not limited to: otitis media, otitis externa, eustachian tube dysfunction, cerumen impaction Left TM slightly erythematous with opacified effusion noted on exam.  Concern for otitis media secondary to viral URI.  Amoxicillin  875 mg twice daily was prescribed. Strict ED precautions were given and patient verbalized understanding.  Acute cough Nasal congestion Afebrile, nontoxic-appearing, NAD. VSS. DDX includes but not limited to: COVID, flu, bronchitis, pneumonia, viral URI COVID and flu are negative.  Suspect viral etiology.  Recommend symptomatic treatment. Benzonatate  200mg  TID PRN was prescribed for cough as well as Flonase  1 spray each nostril every day PRN was prescribed for congestion and post nasal drip. Strict ED precautions were given and patient verbalized understanding.  Acute pharyngitis, unspecified etiology Laryngitis Afebrile, nontoxic-appearing, NAD. VSS. DDX includes but not limited to: strep, mono, viral pharyngitis,  post nasal drip Strep was negative.  Suspect viral etiology. Recommend salt water gargles and over-the-counter throat spray/lozenges. Strict ED precautions were given and patient verbalized understanding.  ED Discharge Orders          Ordered    benzonatate  (TESSALON ) 200 MG capsule  3 times daily PRN        09/13/24 1320    fluticasone  (FLONASE ) 50 MCG/ACT nasal spray  Daily PRN        09/13/24 1320    amoxicillin  (AMOXIL ) 875 MG tablet  Every 12 hours        09/13/24 1320             PDMP not reviewed this encounter.      [1] No Known Allergies [2]  Social History Tobacco Use   Smoking status: Never    Passive exposure: Never   Smokeless tobacco: Never  Vaping Use   Vaping status: Never Used  Substance Use Topics   Alcohol use: Yes    Comment: less than one drink per month   Drug use: No     Aira Sallade P, [name] 09/13/24 1324  "

## 2024-09-13 NOTE — Discharge Instructions (Signed)
 Your flu, COVID, and strep tests were negative.Your symptoms appear consistent with a viral respiratory illness. Recommend symptomatic treatment with nasal spray (Flonase ) for congestion and Tylenol /Ibuprofen  as needed for fevers/aches.   I have also sent a cough medicine to your pharmacy as well. Please take as directed.  I do believe that you have an ear infection developing on the left side. I sent a prescription for Amoxicillin  to cover it. Please take as directed.  Return in 3-4 days if no improvement. If new or worsening symptoms such as presistent fevers, difficulty breathing, shortness of breath, or chest pain, go directly to the ER.

## 2024-09-13 NOTE — ED Triage Notes (Signed)
 The patient reports chills, cough, nasal congestion, and hoarse voice.   Started: about a day ago   Home interventions: dayquil, tylenol 

## 2024-09-26 ENCOUNTER — Institutional Professional Consult (permissible substitution): Admitting: Plastic Surgery

## 2024-10-07 NOTE — Progress Notes (Unsigned)
 BH MD/PA/NP OP Progress Note  10/10/2024 3:34 PM Morgan Chang  MRN:  [id number]  Visit Diagnosis:    ICD-10-CM   1. Generalized anxiety disorder  F41.1 clonazePAM  (KLONOPIN ) 0.5 MG tablet    2. Panic attack as reaction to stress  F41.0 clonazePAM  (KLONOPIN ) 0.5 MG tablet   F43.0        Assessment: Morgan Chang is a 29 y.o. female with a history of MDD, sleep difficulties and reported ADHD who presented to [facility] for initial evaluation on 12/02/2022.  During initial evaluation patient reported significant symptoms of anxiety including excessive worry control, difficulty relaxing, racing thoughts worse at night that affects sleep, restlessness, increased irritability, and fears about happening.  She also endorsed experiencing panic attacks which started in December 2023 and occur once every couple days.  During the episodes of panic patient reported symptoms of diaphoresis, increased restlessness, picking behaviors, and dissociations.  Patient also does note some depressive symptoms, that appear to be a bit better managed after starting the Trintellix .  She denied any SI or thoughts of self-harm along with any history of mania or psychosis. Patient met criteria for generalized anxiety disorder and panic attacks.   Over the course of treatment patient had an episode which was concerning for hypomania in July-August of 2024.  Patient had described increased irritability, mood lability, negative self thoughts, feelings of worthlessness, insomnia, and passive SI for a 4-day period. Patient had presented to [facility] who started Abilify  and patient noted some improvement in her symptoms following this. With this episode patient also meets criteria for Bipolar 2 disorder.  Morgan Chang presents for follow-up evaluation. Today, 10/10/24, patients anxiety has significantly increased in the interim secondary to a new situational stressor.  She describes feeling  overwhelmed with racing thoughts though has still been able to handle tasks.  Will continue on her current regimen and refill Klonopin  today.  Patient denies any depression, hypomania, or mood lability symptoms.  Of note she did not start the Vyvanse  in the interim due to concern that it might worsen anxiety.  Will follow-up in a month and consider medication adjustments if situational stressor and anxiety have not improved.  Will continue current regimen with the exception of Vyvanse  which will be held.  Plan: - Continue Prozac  10 mg daily  - Continue Lamictal  200 mg daily - Hold Vyvanse  10 mg daily - Continue clonidine  0.1 mg to TID prn for anxiety - Continue Klonopin  0.5 mg every day prn for anxiety (has not used in 6 weeks) - Continue Ambien  to CR 12.5 mg QHS - CMP, CBC, TSH, Vit D, cortisol reviewed - Crisis resources reviewed - Therapy with [facility] and working through [facility], wants to eventually switch to [employer] for insurance and we will refer once openings are available - Follow up in a month  Chief Complaint:  Chief Complaint  Patient presents with   Follow-up   HPI: On presentation today Morgan Chang reports that things have been tough the last 6 weeks.  She separated from her partner shortly after the last appointment and they are just starting that the divorce proceedings. Morgan Chang left with the kids and now is living with her mother in [place].  Patient reports that the relationship had been up and down for a while with the last year being worse.  While the separation came up suddenly the decision was mutual per patient.  Since the separation patient reports feeling significant increase in anxiety and  that she is overwhelmed with everything.  This is largely related to the change in setting and routine.  Her kids are still able to go to school but they are now living in her mother's house which is taking some getting used to.  They also have visitation with her father a  couple days a week which is a change.  Financially things have been difficult as patient is not receiving child support or alimony.  Her partner has taken their money and regimented how he disperses it to her.  Patient does have a clinical research associate and they are working on getting the child support and alimony's payments set up.  Once the financial piece is resolved Morgan Chang believes that the anxiety will improve in turn.  She does notice that when she is busy at work the anxiety is slightly better.  She has also had benefit from the clonidine .  She has not taken the Klonopin  as she has no tabs leftover.  Reviewed this and agreed to refill the medication recommending that she use it as a second line during this time.  Patient denies any depression or concerns of mania with the increase stress.  She is still sleeping 7 hours at night.  Initially following the separation she would wake up in the middle the night with a jolt potentially due to anxiety.  This has resolved over the last 2 weeks.  Did review medication titration however patient did not feel this was warranted at this time as she believes anxiety will improve once finances are taken care of.  Given increase stress and concern of triggering hypomania patient did not start Vyvanse  after last appointment.  Past Psychiatric History: The patient has one prior psychiatric hospitalization to [facility] in September of 2024 due to worsening depression and SI. She denies any suicide attempts.  She has been connected with a couple therapists in the past, though is currently not seeing anyone after her last therapist left the practice in January.   She has tried mirtazapine , Wellbutrin , Prozac , Zoloft , Lexapro (headaches), Celexa , Trintellix  (loss of benefit over time), venlafaxine (orthostatic hypotension), BuSpar, propranolol  (helped with akathisia, Atarax  (sleepy), Seroquel  (hair loss), Zyprexa  (oral motor issues), Risperdal  (lactation), trazodone, Depakote, Adderall,  Vyvanse , Ritalin , atomoxetine  (10 mg dose ineffective did not titrate higher), Klonopin , Xanax, Lunesta , Ambien   She denies any substance use denies other than one alcoholic drink a week.   Past Medical History:  Past Medical History:  Diagnosis Date   Anemia 09/14/2021   Anxiety    Depression    Endometriosis    Fibroid    Frank breech presentation 10/29/2016   GERD (gastroesophageal reflux disease) 12/10/2014   Gestational diabetes    Migraines    Pregnancy induced hypertension    S/P cesarean section 10/29/2016    Past Surgical History:  Procedure Laterality Date   ADENOIDECTOMY  10/29/2007   BREAST REDUCTION SURGERY     BREAST SURGERY  08/2015   CESAREAN SECTION N/A 10/29/2016   Procedure: CESAREAN SECTION;  Surgeon: Ezzie Buba, MD;  Location: [facility];  Service: Obstetrics;  Laterality: N/A;   CESAREAN SECTION N/A 07/25/2021   Procedure: Repeat CESAREAN SECTION;  Surgeon: Kandyce Sor, MD;  Location: [facility] LD ORS;  Service: Obstetrics;  Laterality: N/A;   Repeat C/S BTL   LAPAROSCOPY  12/10/2014   NISSEN FUNDOPLICATION     RHINOPLASTY  03/22/2010   deviated septum   TONSILLECTOMY  2017   TUBAL LIGATION  07/25/2021    Family History:  Family History  Problem Relation Age of Onset   Heart disease Mother    Hypertension Mother    Anxiety disorder Mother    Depression Mother    Alcohol abuse Mother    Arthritis Mother    Hypertension Father    Anxiety disorder Father    Depression Father    Heart attack Father    Heart failure Father    Testicular cancer Brother    Diabetes Maternal Grandmother    Heart attack Maternal Grandfather        x 2    Social History:  Social History   Socioeconomic History   Marital status: Married    Spouse name: Not on file   Number of children: Not on file   Years of education: Not on file   Highest education level: Not on file  Occupational History   Occupation: Homemaker  Tobacco Use   Smoking  status: Never    Passive exposure: Never   Smokeless tobacco: Never  Vaping Use   Vaping status: Never Used  Substance and Sexual Activity   Alcohol use: Yes    Comment: less than one drink per month   Drug use: No   Sexual activity: Yes    Birth control/protection: Surgical  Other Topics Concern   Not on file  Social History Narrative   Stay at home   2 children -- 68 year old son and 9 year old daughter (2024)   Married   Social Drivers of Health   Tobacco Use: Low Risk (10/10/2024)   Patient History    Smoking Tobacco Use: Never    Smokeless Tobacco Use: Never    Passive Exposure: Never  Financial Resource Strain: Not on file  Food Insecurity: No Food Insecurity (06/06/2023)   Hunger Vital Sign    Worried About Running Out of Food in the Last Year: Never true    Ran Out of Food in the Last Year: Never true  Transportation Needs: No Transportation Needs (06/06/2023)   PRAPARE - Administrator, Civil Service (Medical): No    Lack of Transportation (Non-Medical): No  Physical Activity: Not on file  Stress: Not on file  Social Connections: Unknown (04/04/2023)   Received from [facility]   Social Network    Social Network: Not on file  Depression (PHQ2-9): Low Risk (06/10/2024)   Depression (PHQ2-9)    PHQ-2 Score: 0  Alcohol Screen: Low Risk (06/06/2023)   Alcohol Screen    Last Alcohol Screening Score (AUDIT): 0  Housing: Low Risk (06/06/2023)   Housing    Last Housing Risk Score: 0  Utilities: Not At Risk (06/06/2023)   AHC Utilities    Threatened with loss of utilities: No  Health Literacy: Not on file    Allergies: No Known Allergies  Current Medications: Current Outpatient Medications  Medication Sig Dispense Refill   benzonatate  (TESSALON ) 200 MG capsule Take 1 capsule (200 mg total) by mouth 3 (three) times daily as needed for cough. 30 capsule 0   clonazePAM  (KLONOPIN ) 0.5 MG tablet Take 1 tablet (0.5 mg total) by mouth daily as needed for  anxiety. 30 tablet 0   cloNIDine  (CATAPRES ) 0.1 MG tablet Take 1 tablet (0.1 mg total) by mouth 3 (three) times daily as needed (anxiety). 90 tablet 2   famotidine  (PEPCID ) 10 MG tablet Take 10 mg by mouth daily.     FLUoxetine  (PROZAC ) 10 MG capsule Take 1 capsule (10 mg total) by mouth daily. 90  capsule 0   fluticasone  (FLONASE ) 50 MCG/ACT nasal spray Place 1 spray into both nostrils daily as needed. 18.2 mL 0   lamoTRIgine  (LAMICTAL ) 200 MG tablet Take 1 tablet (200 mg total) by mouth daily. 90 tablet 0   lisdexamfetamine  (VYVANSE ) 10 MG capsule Take 1 capsule (10 mg total) by mouth daily. 30 capsule 0   lisdexamfetamine  (VYVANSE ) 10 MG capsule Take 1 capsule (10 mg total) by mouth daily. 30 capsule 0   metoCLOPramide  (REGLAN ) 10 MG tablet Take 1 tablet (10 mg total) by mouth every 6 (six) hours. 30 tablet 0   pantoprazole  (PROTONIX ) 40 MG tablet Take 1 tablet (40 mg total) by mouth daily. 30 tablet 0   sucralfate  (CARAFATE ) 1 g tablet Take 1 tablet (1 g total) by mouth 4 (four) times daily -  with meals and at bedtime for 7 days. 28 tablet 0   zolpidem  (AMBIEN  CR) 12.5 MG CR tablet Take 1 tablet (12.5 mg total) by mouth at bedtime as needed. for sleep 30 tablet 2   No current facility-administered medications for this visit.     Psychiatric Specialty Exam: Review of Systems  Last menstrual period 09/12/2024.There is no height or weight on file to calculate BMI.  General Appearance: Fairly Groomed  Eye Contact:  Good  Speech:  Clear and Coherent and Pressured  Volume:  Normal  Mood:  Anxious  Affect:  Congruent  Thought Process:  Coherent and Goal Directed  Orientation:  Full (Time, Place, and Person)  Thought Content: Logical   Suicidal Thoughts:  No  Homicidal Thoughts:  No  Memory:  Immediate;   Good  Judgement:  Good  Insight:  Fair  Psychomotor Activity:  Normal  Concentration:  Concentration: Fair  Recall:  Good  Fund of Knowledge: Fair  Language: Good  Akathisia:  NA     AIMS (if indicated): not done  Assets:  Communication Skills Desire for Improvement Housing Talents/Skills Transportation  ADL's:  Intact  Cognition: WNL  Sleep:  Good   Metabolic Disorder Labs: Lab Results  Component Value Date   HGBA1C 5.3 10/05/2023   No results found for: PROLACTIN Lab Results  Component Value Date   CHOL 167 04/20/2023   TRIG 99.0 04/20/2023   HDL 56.30 04/20/2023   CHOLHDL 3 04/20/2023   VLDL 19.8 04/20/2023   LDLCALC 91 04/20/2023   Lab Results  Component Value Date   TSH 0.75 04/08/2024   TSH 0.46 10/05/2023    Therapeutic Level Labs: No results found for: LITHIUM No results found for: VALPROATE No results found for: CBMZ   Screenings: AUDIT    Flowsheet Row Admission (Discharged) from 06/06/2023 in [facility]INE  Alcohol Use Disorder Identification Test Final Score (AUDIT) 0   GAD-7    Flowsheet Row Office Visit from 06/10/2024 in [facility] [employer] Visit from 10/05/2023 in [facility] [place] [employer] from 04/20/2023 in [facility] [place] Visit from 12/01/2022 [facility] Video Visit from 09/30/2022 in [facility] [place]  Total GAD-7 Score 4 21 21 20 3    [employer]    Flowsheet Row Office Visit from 06/10/2024 in [facility] [place] [employer] from 10/05/2023 in [facility] [place] [employer] from 04/20/2023 in [facility] [place] from  12/01/2022 in [facility] Office Visit from 08[facility]25/2022 in [facility] Total Score 0 1 3 1 6   PHQ-9 Total Score 0 9 15 11 18    Flowsheet Row UC from 09/13/2024 in [facility] at [facility] ED from 07/08/2024 in [facility] Department at [facility] UC from 01/22/2024 in [facility] at [facility])  C-SSRS RISK CATEGORY No Risk No Risk No Risk    Collaboration of Care: Collaboration of Care: Medication Management AEB medication prescription and Other provider involved in patient's care AEB ED, urgent care and PCP chart review  Patient/Guardian was advised Release of Information must be obtained prior to any record release in order to collaborate their care with an outside provider. Patient/Guardian was advised if they have not already done so to contact the registration department to sign all necessary forms in order for us  to release information regarding their care.   Consent: Patient/Guardian gives verbal consent for treatment and assignment of benefits for services provided during this visit. Patient/Guardian expressed understanding and agreed to proceed.    Morgan CHRISTELLA Finder, MD 10/10/2024, 3:34 PM   Virtual Visit via Video Note  I connected with Morgan Chang on 10/10/24 at  3:00 PM EST by a video enabled telemedicine application and verified that I am speaking with the correct person using two identifiers.  Location: Patient: Home Provider: Home Office   I discussed the limitations of evaluation and management by telemedicine and the availability of in person appointments. The patient expressed understanding and agreed to proceed.   I discussed the assessment and treatment plan with the patient. The patient was provided an opportunity to ask questions and all were answered. The patient agreed with the plan and demonstrated an understanding of the instructions.   The patient was advised to call back or seek an in-person evaluation if the symptoms worsen or if the condition fails to improve as anticipated.  I provided 30 minutes of non-face-to-face time during this encounter.   Morgan CHRISTELLA Finder, MD

## 2024-10-08 ENCOUNTER — Other Ambulatory Visit: Payer: Self-pay | Admitting: Obstetrics and Gynecology

## 2024-10-08 DIAGNOSIS — N6311 Unspecified lump in the right breast, upper outer quadrant: Secondary | ICD-10-CM

## 2024-10-09 ENCOUNTER — Encounter: Payer: Self-pay | Admitting: Physician Assistant

## 2024-10-10 ENCOUNTER — Telehealth (HOSPITAL_COMMUNITY): Admitting: Psychiatry

## 2024-10-10 ENCOUNTER — Encounter (HOSPITAL_COMMUNITY): Payer: Self-pay | Admitting: Psychiatry

## 2024-10-10 DIAGNOSIS — F411 Generalized anxiety disorder: Secondary | ICD-10-CM

## 2024-10-10 DIAGNOSIS — F430 Acute stress reaction: Secondary | ICD-10-CM | POA: Diagnosis not present

## 2024-10-10 DIAGNOSIS — F410 Panic disorder [episodic paroxysmal anxiety] without agoraphobia: Secondary | ICD-10-CM

## 2024-10-10 MED ORDER — CLONAZEPAM 0.5 MG PO TABS: 0.5000 mg | ORAL_TABLET | Freq: Every day | ORAL | 0 refills | Status: AC | PRN

## 2024-11-07 ENCOUNTER — Telehealth (HOSPITAL_COMMUNITY): Admitting: Psychiatry
